# Patient Record
Sex: Female | Born: 1955
Health system: Southern US, Community
[De-identification: ages and names within clinical notes are randomized; demographics above are authoritative.]

## PROBLEM LIST (undated history)

## (undated) DIAGNOSIS — C50919 Malignant neoplasm of unspecified site of unspecified female breast: Secondary | ICD-10-CM

## (undated) DIAGNOSIS — R112 Nausea with vomiting, unspecified: Secondary | ICD-10-CM

## (undated) DIAGNOSIS — I10 Essential (primary) hypertension: Secondary | ICD-10-CM

## (undated) DIAGNOSIS — M1990 Unspecified osteoarthritis, unspecified site: Secondary | ICD-10-CM

## (undated) DIAGNOSIS — R7303 Prediabetes: Secondary | ICD-10-CM

## (undated) DIAGNOSIS — K219 Gastro-esophageal reflux disease without esophagitis: Secondary | ICD-10-CM

## (undated) DIAGNOSIS — Z9889 Other specified postprocedural states: Secondary | ICD-10-CM

## (undated) DIAGNOSIS — T7840XA Allergy, unspecified, initial encounter: Secondary | ICD-10-CM

## (undated) HISTORY — DX: Allergy, unspecified, initial encounter: T78.40XA

## (undated) HISTORY — DX: Essential (primary) hypertension: I10

## (undated) HISTORY — PX: COLONOSCOPY: SHX174

## (undated) HISTORY — DX: Malignant neoplasm of unspecified site of unspecified female breast: C50.919

---

## 1978-11-23 HISTORY — PX: OVARIAN CYST REMOVAL: SHX89

## 1998-06-13 ENCOUNTER — Other Ambulatory Visit: Admission: RE | Admit: 1998-06-13 | Discharge: 1998-06-13 | Payer: Self-pay | Admitting: Gynecology

## 1998-06-19 ENCOUNTER — Ambulatory Visit (HOSPITAL_COMMUNITY): Admission: RE | Admit: 1998-06-19 | Discharge: 1998-06-19 | Payer: Self-pay | Admitting: Internal Medicine

## 1998-06-26 ENCOUNTER — Ambulatory Visit (HOSPITAL_COMMUNITY): Admission: RE | Admit: 1998-06-26 | Discharge: 1998-06-26 | Payer: Self-pay | Admitting: Internal Medicine

## 1998-09-11 ENCOUNTER — Ambulatory Visit (HOSPITAL_COMMUNITY): Admission: RE | Admit: 1998-09-11 | Discharge: 1998-09-11 | Payer: Self-pay | Admitting: Gastroenterology

## 1999-06-09 ENCOUNTER — Other Ambulatory Visit: Admission: RE | Admit: 1999-06-09 | Discharge: 1999-06-09 | Payer: Self-pay | Admitting: Gynecology

## 1999-07-29 ENCOUNTER — Ambulatory Visit (HOSPITAL_COMMUNITY): Admission: RE | Admit: 1999-07-29 | Discharge: 1999-07-29 | Payer: Self-pay | Admitting: Urology

## 1999-07-29 ENCOUNTER — Encounter: Payer: Self-pay | Admitting: Urology

## 2000-12-01 ENCOUNTER — Other Ambulatory Visit: Admission: RE | Admit: 2000-12-01 | Discharge: 2000-12-01 | Payer: Self-pay | Admitting: Gynecology

## 2001-11-01 ENCOUNTER — Other Ambulatory Visit: Admission: RE | Admit: 2001-11-01 | Discharge: 2001-11-01 | Payer: Self-pay | Admitting: Obstetrics and Gynecology

## 2002-11-23 HISTORY — PX: JOINT REPLACEMENT: SHX530

## 2002-11-23 HISTORY — PX: TOTAL HIP ARTHROPLASTY: SHX124

## 2002-12-12 ENCOUNTER — Other Ambulatory Visit: Admission: RE | Admit: 2002-12-12 | Discharge: 2002-12-12 | Payer: Self-pay | Admitting: Obstetrics and Gynecology

## 2003-03-28 ENCOUNTER — Encounter: Payer: Self-pay | Admitting: Orthopedic Surgery

## 2003-04-04 ENCOUNTER — Encounter: Payer: Self-pay | Admitting: Orthopedic Surgery

## 2003-04-04 ENCOUNTER — Inpatient Hospital Stay (HOSPITAL_COMMUNITY): Admission: RE | Admit: 2003-04-04 | Discharge: 2003-04-08 | Payer: Self-pay | Admitting: Orthopedic Surgery

## 2003-04-07 ENCOUNTER — Encounter: Payer: Self-pay | Admitting: Orthopedic Surgery

## 2006-02-24 ENCOUNTER — Encounter: Admission: RE | Admit: 2006-02-24 | Discharge: 2006-05-25 | Payer: Self-pay | Admitting: Orthopedic Surgery

## 2013-10-31 ENCOUNTER — Encounter (INDEPENDENT_AMBULATORY_CARE_PROVIDER_SITE_OTHER): Payer: Self-pay | Admitting: General Surgery

## 2013-10-31 ENCOUNTER — Ambulatory Visit (INDEPENDENT_AMBULATORY_CARE_PROVIDER_SITE_OTHER): Payer: BC Managed Care – PPO | Admitting: General Surgery

## 2013-10-31 ENCOUNTER — Encounter (INDEPENDENT_AMBULATORY_CARE_PROVIDER_SITE_OTHER): Payer: Self-pay

## 2013-10-31 VITALS — BP 120/62 | HR 60 | Temp 98.0°F | Resp 18 | Ht 66.0 in | Wt 211.0 lb

## 2013-10-31 DIAGNOSIS — N6099 Unspecified benign mammary dysplasia of unspecified breast: Secondary | ICD-10-CM

## 2013-10-31 DIAGNOSIS — N6089 Other benign mammary dysplasias of unspecified breast: Secondary | ICD-10-CM

## 2013-10-31 NOTE — Progress Notes (Signed)
Patient ID: Candice Ayala, female   DOB: 08/20/1956, 57 y.o.   MRN: [id number]  Chief Complaint  Patient presents with  . New Evaluation    rt breast    HPI Candice Ayala is a 57 y.o. female.   HPI  She is referred by Dr. Cornella because of a recent diagnosis of right breast sclerosing papillary lesion with atypical ductal hyperplasia.  She was undergoing her annual mammogram when this lesion was noted at the 12:00 position anteriorly in the right breast. It measured 7 mm and was lobular an ultrasound. Image guided biopsy was performed and demonstrates the mentioned pathology. She has no breast complaints. She denies any nipple discharge or palpable nodules.  Her grandmother had breast cancer. Age at menarche was 10 or 11. She has not had any children. She has been menopausal since her 40s. She has not taken hormone replacement treatment.  History reviewed. No pertinent past medical history.  Past Surgical History  Procedure Laterality Date  . Ovarian cyst removal  1980    Family History  Problem Relation Age of Onset  . Cancer Mother     colon    Social History History  Substance Use Topics  . Smoking status: Never Smoker   . Smokeless tobacco: Not on file  . Alcohol Use: Not on file    Allergies  Allergen Reactions  . Contrast Media [Iodinated Diagnostic Agents] Hives    Current Outpatient Prescriptions  Medication Sig Dispense Refill  . co-enzyme Q-10 30 MG capsule Take 30 mg by mouth 3 (three) times daily.      . Garlic 10 MG CAPS Take by mouth.      . Multiple Vitamins-Minerals (MULTIVITAMIN WITH MINERALS) tablet Take 1 tablet by mouth daily.      . omeprazole (PRILOSEC) 20 MG capsule Take 20 mg by mouth daily.       No current facility-administered medications for this visit.    Review of Systems Review of Systems  Constitutional: Negative.   HENT: Negative.   Respiratory: Negative.   Cardiovascular: Negative.   Gastrointestinal: Negative.    Genitourinary: Negative.   Hematological: Negative.     Blood pressure 120/62, pulse 60, temperature 98 F (36.7 C), resp. rate 18, height 5' 6" (1.676 m), weight 211 lb (95.709 kg).  Physical Exam Physical Exam  Constitutional: No distress.  Overweight female.  HENT:  Head: Normocephalic and atraumatic.  Eyes: No scleral icterus.  Neck: Neck supple.  Cardiovascular: Normal rate and regular rhythm.   Pulmonary/Chest: Effort normal and breath sounds normal.  Ecchymosis and a firm area 12:00 position right breast. Small puncture wound lateral to this. No other palpable abnormalities. Left breast is without any palpable abnormalities. No nipple discharge present in either breast.  Abdominal: Soft. She exhibits no mass.  Lower midline scar.  Musculoskeletal:  No supraclavicular or axillary adenopathy.  Lymphadenopathy:    She has no cervical adenopathy.  Neurological: She is alert.  Skin: Skin is warm and dry.  Psychiatric: She has a normal mood and affect. Her behavior is normal.    Data Reviewed Mammograms. Mammogram and ultrasound report. Pathology report.  Assessment    Right breast sclerosing papillary lesion and atypical ductal hyperplasia. This is a potentially premalignant lesion and I explained this to her. I recommended wider excision. She understands and agrees.     Plan    Right breast biopsy/excision after wire localization.  I have explained the procedure, risks, and aftercare to her.    Risks include but are not limited to bleeding, infection, wound problems, seroma formation, anesthesia, need for another operation.  She seems to understand and agrees with the plan.       Kennet Mccort J 10/31/2013, 5:25 PM    

## 2013-10-31 NOTE — Patient Instructions (Signed)
Stop the garlic and CoQ10 5 days before your surgery.   BREAST BIOPSY/ PARTIAL MASTECTOMY: POST OP INSTRUCTIONS  Always review your discharge instruction sheet given to you by the facility where your surgery was performed.  IF YOU HAVE DISABILITY OR FAMILY LEAVE FORMS, YOU MUST BRING THEM TO THE OFFICE FOR PROCESSING.  DO NOT GIVE THEM TO YOUR DOCTOR.  1. A prescription for pain medication may be given to you upon discharge.  Take your pain medication as prescribed, if needed.  If narcotic pain medicine is not needed, then you may take acetaminophen (Tylenol) or ibuprofen (Advil) as needed. 2. Take your usually prescribed medications unless otherwise directed 3. If you need a refill on your pain medication, please contact your pharmacy.  They will contact our office to request authorization.  Prescriptions will not be filled after 5pm or on week-ends. 4. You should eat very light the first 24 hours after surgery, such as soup, crackers, pudding, etc.  Resume your normal diet the day after surgery. 5. Most patients will experience some swelling and bruising in the breast.  Ice packs and a good support bra will help.  Swelling and bruising can take several days to resolve.  6. It is common to experience some constipation if taking pain medication after surgery.  Increasing fluid intake and taking a stool softener will usually help or prevent this problem from occurring.  A mild laxative (Milk of Magnesia or Miralax) should be taken according to package directions if there are no bowel movements after 48 hours. 7. Unless discharge instructions indicate otherwise, you may remove your bandages 48 hours after surgery, and you may shower at that time.  You may have steri-strips (small skin tapes) in place directly over the incision.  These strips should be left on the skin for 14 days.  If your surgeon used skin glue on the incision, you may shower in 24 hours.  The glue will flake off over the next 2-3  weeks.  Any sutures or staples will be removed at the office during your follow-up visit. 8. ACTIVITIES:  You may resume regular daily activities (gradually increasing) beginning the next day.  Wearing a good support bra or sports bra minimizes pain and swelling.  You may have sexual intercourse when it is comfortable. a. You may drive when you no longer are taking prescription pain medication, you can comfortably wear a seatbelt, and you can safely maneuver your car and apply brakes. b. RETURN TO WORK:  _3-7 days when comfortable._____________________________________________________________________________________ 9. You should see your doctor in the office for a follow-up appointment approximately two weeks after your surgery.  Your doctor's nurse will typically make your follow-up appointment when she calls you with your pathology report.  Expect your pathology report 2-3 business days after your surgery.  You may call to check if you do not hear from [place] after three days. 10. OTHER INSTRUCTIONS: _______________________________________________________________________________________________ _____________________________________________________________________________________________________________________________________ _____________________________________________________________________________________________________________________________________ _____________________________________________________________________________________________________________________________________  WHEN TO CALL YOUR DOCTOR: 1. Fever over 101.0 2. Nausea and/or vomiting. 3. Extreme swelling or bruising. 4. Continued bleeding from incision. 5. Increased pain, redness, or drainage from the incision.  The clinic staff is available to answer your questions during regular business hours.  Please don't hesitate to call and ask to speak to one of the nurses for clinical concerns.  If you have a medical emergency, go to  the nearest emergency room or call 911.  A surgeon from [facility] is always on call at the hospital, [phone].  For further questions, please visit [email or web address]

## 2013-11-01 ENCOUNTER — Encounter (INDEPENDENT_AMBULATORY_CARE_PROVIDER_SITE_OTHER): Payer: Self-pay

## 2013-11-01 ENCOUNTER — Telehealth (INDEPENDENT_AMBULATORY_CARE_PROVIDER_SITE_OTHER): Payer: Self-pay | Admitting: General Surgery

## 2013-11-01 NOTE — Telephone Encounter (Signed)
Called patient about surgery, gave financial responsibilities, patient will call back

## 2013-11-01 NOTE — Telephone Encounter (Signed)
Noted  

## 2013-11-06 ENCOUNTER — Encounter (INDEPENDENT_AMBULATORY_CARE_PROVIDER_SITE_OTHER): Payer: Self-pay

## 2013-11-07 ENCOUNTER — Encounter (HOSPITAL_BASED_OUTPATIENT_CLINIC_OR_DEPARTMENT_OTHER): Payer: Self-pay | Admitting: *Deleted

## 2013-11-07 NOTE — Progress Notes (Signed)
No heart or resp problems-to come in for labs

## 2013-11-14 ENCOUNTER — Encounter (INDEPENDENT_AMBULATORY_CARE_PROVIDER_SITE_OTHER): Payer: Self-pay

## 2013-11-15 ENCOUNTER — Encounter (HOSPITAL_BASED_OUTPATIENT_CLINIC_OR_DEPARTMENT_OTHER): Payer: Self-pay | Admitting: *Deleted

## 2013-11-15 NOTE — Progress Notes (Signed)
To come in for [facility] labs- 

## 2013-11-24 ENCOUNTER — Encounter (HOSPITAL_BASED_OUTPATIENT_CLINIC_OR_DEPARTMENT_OTHER)
Admission: RE | Admit: 2013-11-24 | Discharge: 2013-11-24 | Disposition: A | Discharge status: Home / Self Care | Payer: BC Managed Care – PPO | Source: Ambulatory Visit | Attending: General Surgery | Admitting: General Surgery

## 2013-11-24 LAB — CBC WITH DIFFERENTIAL/PLATELET
BASOS ABS: 0 10*3/uL (ref 0.0–0.1)
BASOS PCT: 1 % (ref 0–1)
Eosinophils Absolute: 0.1 10*3/uL (ref 0.0–0.7)
Eosinophils Relative: 1 % (ref 0–5)
HEMATOCRIT: 37.3 % (ref 36.0–46.0)
HEMOGLOBIN: 12.4 g/dL (ref 12.0–15.0)
Lymphocytes Relative: 48 % — ABNORMAL HIGH (ref 12–46)
Lymphs Abs: 2.9 10*3/uL (ref 0.7–4.0)
MCH: 29.2 pg (ref 26.0–34.0)
MCHC: 33.2 g/dL (ref 30.0–36.0)
MCV: 88 fL (ref 78.0–100.0)
MONO ABS: 0.4 10*3/uL (ref 0.1–1.0)
MONOS PCT: 7 % (ref 3–12)
NEUTROS ABS: 2.6 10*3/uL (ref 1.7–7.7)
NEUTROS PCT: 43 % (ref 43–77)
Platelets: 330 10*3/uL (ref 150–400)
RBC: 4.24 MIL/uL (ref 3.87–5.11)
RDW: 13.8 % (ref 11.5–15.5)
WBC: 6.1 10*3/uL (ref 4.0–10.5)

## 2013-11-24 LAB — COMPREHENSIVE METABOLIC PANEL
ALBUMIN: 3.6 g/dL (ref 3.5–5.2)
ALK PHOS: 94 U/L (ref 39–117)
ALT: 12 U/L (ref 0–35)
AST: 18 U/L (ref 0–37)
BUN: 13 mg/dL (ref 6–23)
CO2: 26 meq/L (ref 19–32)
Calcium: 8.9 mg/dL (ref 8.4–10.5)
Chloride: 103 mEq/L (ref 96–112)
Creatinine, Ser: 0.66 mg/dL (ref 0.50–1.10)
GFR calc Af Amer: >90 mL/min (ref >90)
Glucose, Bld: 96 mg/dL (ref 70–99)
POTASSIUM: 4.7 meq/L (ref 3.7–5.3)
Sodium: 142 mEq/L (ref 137–147)
Total Bilirubin: 0.4 mg/dL (ref 0.3–1.2)
Total Protein: 7.4 g/dL (ref 6.0–8.3)

## 2013-11-24 LAB — PROTIME-INR
INR: 0.96 (ref 0.00–1.49)
Prothrombin Time: 12.6 seconds (ref 11.6–15.2)

## 2013-11-27 ENCOUNTER — Encounter (HOSPITAL_BASED_OUTPATIENT_CLINIC_OR_DEPARTMENT_OTHER): Payer: BC Managed Care – PPO | Admitting: Anesthesiology

## 2013-11-27 ENCOUNTER — Encounter (HOSPITAL_BASED_OUTPATIENT_CLINIC_OR_DEPARTMENT_OTHER)
Admission: RE | Disposition: A | Discharge status: Home / Self Care | Payer: Self-pay | Source: Ambulatory Visit | Attending: General Surgery

## 2013-11-27 ENCOUNTER — Ambulatory Visit (HOSPITAL_BASED_OUTPATIENT_CLINIC_OR_DEPARTMENT_OTHER)
Admission: RE | Admit: 2013-11-27 | Discharge: 2013-11-27 | Disposition: A | Discharge status: Home / Self Care | Payer: BC Managed Care – PPO | Source: Ambulatory Visit | Attending: General Surgery | Admitting: General Surgery

## 2013-11-27 ENCOUNTER — Encounter (HOSPITAL_BASED_OUTPATIENT_CLINIC_OR_DEPARTMENT_OTHER): Payer: Self-pay | Admitting: *Deleted

## 2013-11-27 ENCOUNTER — Ambulatory Visit (HOSPITAL_BASED_OUTPATIENT_CLINIC_OR_DEPARTMENT_OTHER): Payer: BC Managed Care – PPO | Admitting: Anesthesiology

## 2013-11-27 DIAGNOSIS — D249 Benign neoplasm of unspecified breast: Secondary | ICD-10-CM

## 2013-11-27 DIAGNOSIS — K219 Gastro-esophageal reflux disease without esophagitis: Secondary | ICD-10-CM | POA: Insufficient documentation

## 2013-11-27 DIAGNOSIS — N6089 Other benign mammary dysplasias of unspecified breast: Secondary | ICD-10-CM | POA: Insufficient documentation

## 2013-11-27 DIAGNOSIS — Z91041 Radiographic dye allergy status: Secondary | ICD-10-CM | POA: Insufficient documentation

## 2013-11-27 HISTORY — DX: Unspecified osteoarthritis, unspecified site: M19.90

## 2013-11-27 HISTORY — PX: BREAST BIOPSY: SHX20

## 2013-11-27 HISTORY — DX: Other specified postprocedural states: R11.2

## 2013-11-27 HISTORY — DX: Nausea with vomiting, unspecified: R11.2

## 2013-11-27 HISTORY — DX: Other specified postprocedural states: Z98.890

## 2013-11-27 HISTORY — DX: Gastro-esophageal reflux disease without esophagitis: K21.9

## 2013-11-27 SURGERY — BREAST BIOPSY WITH NEEDLE LOCALIZATION
Anesthesia: General | Site: Breast | Laterality: Right

## 2013-11-27 MED ORDER — LIDOCAINE HCL (CARDIAC) 20 MG/ML IV SOLN
INTRAVENOUS | Status: DC | PRN
  Administered 2013-11-27: 100 mg via INTRAVENOUS

## 2013-11-27 MED ORDER — ACETAMINOPHEN 325 MG PO TABS: 650.0000 mg | ORAL_TABLET | ORAL | Status: DC | PRN

## 2013-11-27 MED ORDER — PROPOFOL 10 MG/ML IV BOLUS
INTRAVENOUS | Status: DC | PRN
  Administered 2013-11-27: 140 mg via INTRAVENOUS

## 2013-11-27 MED ORDER — FENTANYL CITRATE 0.05 MG/ML IJ SOLN
INTRAMUSCULAR | Status: DC | PRN
  Administered 2013-11-27: 50 ug via INTRAVENOUS
  Administered 2013-11-27: 100 ug via INTRAVENOUS

## 2013-11-27 MED ORDER — LACTATED RINGERS IV SOLN
INTRAVENOUS | Status: DC
  Administered 2013-11-27 (×2): via INTRAVENOUS

## 2013-11-27 MED ORDER — SODIUM CHLORIDE 0.9 % IJ SOLN: 3.0000 mL | INTRAMUSCULAR | Status: DC | PRN

## 2013-11-27 MED ORDER — OXYCODONE HCL 5 MG/5ML PO SOLN: 5.0000 mg | Freq: Once | ORAL | Status: DC | PRN

## 2013-11-27 MED ORDER — OXYCODONE HCL 5 MG PO TABS: 5.0000 mg | ORAL_TABLET | Freq: Once | ORAL | Status: DC | PRN

## 2013-11-27 MED ORDER — HYDROMORPHONE HCL PF 1 MG/ML IJ SOLN
0.2500 mg | INTRAMUSCULAR | Status: DC | PRN
  Administered 2013-11-27: 0.5 mg via INTRAVENOUS

## 2013-11-27 MED ORDER — HYDROCODONE-ACETAMINOPHEN 5-325 MG PO TABS: 1.0000 | ORAL_TABLET | ORAL | Status: DC | PRN

## 2013-11-27 MED ORDER — ONDANSETRON HCL 4 MG/2ML IJ SOLN
4.0000 mg | Freq: Four times a day (QID) | INTRAMUSCULAR | Status: DC | PRN
Start: 2013-11-27 — End: 2013-11-27

## 2013-11-27 MED ORDER — MIDAZOLAM HCL 5 MG/5ML IJ SOLN
INTRAMUSCULAR | Status: DC | PRN
  Administered 2013-11-27: 2 mg via INTRAVENOUS

## 2013-11-27 MED ORDER — MORPHINE SULFATE 2 MG/ML IJ SOLN: 2.0000 mg | INTRAMUSCULAR | Status: DC | PRN

## 2013-11-27 MED ORDER — BUPIVACAINE HCL (PF) 0.5 % IJ SOLN
INTRAMUSCULAR | Status: DC | PRN
  Administered 2013-11-27: 9 mL

## 2013-11-27 MED ORDER — SODIUM BICARBONATE 4 % IV SOLN
INTRAVENOUS | Status: AC
  Filled 2013-11-27: qty 5

## 2013-11-27 MED ORDER — OXYCODONE HCL 5 MG PO TABS: 5.0000 mg | ORAL_TABLET | ORAL | Status: DC | PRN

## 2013-11-27 MED ORDER — CEFAZOLIN SODIUM-DEXTROSE 2-3 GM-% IV SOLR
INTRAVENOUS | Status: DC | PRN
Start: 2013-11-27 — End: 2013-11-27
  Administered 2013-11-27: 2 g via INTRAVENOUS

## 2013-11-27 MED ORDER — HYDROMORPHONE HCL PF 1 MG/ML IJ SOLN
INTRAMUSCULAR | Status: AC
  Filled 2013-11-27: qty 1

## 2013-11-27 MED ORDER — ACETAMINOPHEN 650 MG RE SUPP: 650.0000 mg | RECTAL | Status: DC | PRN

## 2013-11-27 MED ORDER — ONDANSETRON HCL 4 MG/2ML IJ SOLN
INTRAMUSCULAR | Status: DC | PRN
  Administered 2013-11-27: 4 mg via INTRAVENOUS

## 2013-11-27 MED ORDER — MIDAZOLAM HCL 2 MG/2ML IJ SOLN
INTRAMUSCULAR | Status: AC
  Filled 2013-11-27: qty 2

## 2013-11-27 MED ORDER — DEXAMETHASONE SODIUM PHOSPHATE 4 MG/ML IJ SOLN
INTRAMUSCULAR | Status: DC | PRN
  Administered 2013-11-27: 10 mg via INTRAVENOUS

## 2013-11-27 MED ORDER — BUPIVACAINE HCL (PF) 0.5 % IJ SOLN
INTRAMUSCULAR | Status: AC
Start: 2013-11-27 — End: 2013-11-27
  Filled 2013-11-27: qty 30

## 2013-11-27 MED ORDER — MIDAZOLAM HCL 2 MG/2ML IJ SOLN
1.0000 mg | INTRAMUSCULAR | Status: DC | PRN
Start: 2013-11-27 — End: 2013-11-27

## 2013-11-27 MED ORDER — FENTANYL CITRATE 0.05 MG/ML IJ SOLN: 50.0000 ug | INTRAMUSCULAR | Status: DC | PRN

## 2013-11-27 MED ORDER — LIDOCAINE HCL (PF) 1 % IJ SOLN
INTRAMUSCULAR | Status: AC
  Filled 2013-11-27: qty 30

## 2013-11-27 MED ORDER — FENTANYL CITRATE 0.05 MG/ML IJ SOLN
INTRAMUSCULAR | Status: AC
  Filled 2013-11-27: qty 4

## 2013-11-27 SURGICAL SUPPLY — 44 items
APL SKNCLS STERI-STRIP NONHPOA (GAUZE/BANDAGES/DRESSINGS) ×1
BENZOIN TINCTURE PRP APPL 2/3 (GAUZE/BANDAGES/DRESSINGS) ×2 IMPLANT
BINDER BREAST LRG (GAUZE/BANDAGES/DRESSINGS) IMPLANT
BINDER BREAST MEDIUM (GAUZE/BANDAGES/DRESSINGS) IMPLANT
BINDER BREAST XLRG (GAUZE/BANDAGES/DRESSINGS) IMPLANT
BINDER BREAST XXLRG (GAUZE/BANDAGES/DRESSINGS) IMPLANT
BLADE SURG 10 STRL SS (BLADE) ×2 IMPLANT
CANISTER SUCT 1200ML W/VALVE (MISCELLANEOUS) IMPLANT
CHLORAPREP W/TINT 26ML (MISCELLANEOUS) ×2 IMPLANT
COVER MAYO STAND STRL (DRAPES) ×2 IMPLANT
COVER TABLE BACK 60X90 (DRAPES) ×2 IMPLANT
DECANTER SPIKE VIAL GLASS SM (MISCELLANEOUS) ×2 IMPLANT
DEVICE DUBIN W/COMP PLATE 8390 (MISCELLANEOUS) IMPLANT
DRAPE PED LAPAROTOMY (DRAPES) ×2 IMPLANT
DRAPE UTILITY XL STRL (DRAPES) ×2 IMPLANT
ELECT COATED BLADE 2.86 ST (ELECTRODE) ×2 IMPLANT
ELECT REM PT RETURN 9FT ADLT (ELECTROSURGICAL) ×2
ELECTRODE REM PT RTRN 9FT ADLT (ELECTROSURGICAL) ×1 IMPLANT
GLOVE BIOGEL M 7.0 STRL (GLOVE) ×1 IMPLANT
GLOVE BIOGEL PI IND STRL 7.5 (GLOVE) IMPLANT
GLOVE BIOGEL PI IND STRL 8.5 (GLOVE) ×1 IMPLANT
GLOVE BIOGEL PI INDICATOR 7.5 (GLOVE) ×1
GLOVE BIOGEL PI INDICATOR 8.5 (GLOVE) ×1
GLOVE ECLIPSE 8.0 STRL XLNG CF (GLOVE) ×2 IMPLANT
GOWN PREVENTION PLUS XLARGE (GOWN DISPOSABLE) ×2 IMPLANT
GOWN STRL REUS W/ TWL LRG LVL3 (GOWN DISPOSABLE) ×1 IMPLANT
GOWN STRL REUS W/TWL LRG LVL3 (GOWN DISPOSABLE) ×2
NDL HYPO 25X1 1.5 SAFETY (NEEDLE) ×1 IMPLANT
NEEDLE HYPO 25X1 1.5 SAFETY (NEEDLE) ×2 IMPLANT
NS IRRIG 1000ML POUR BTL (IV SOLUTION) ×2 IMPLANT
PACK BASIN DAY SURGERY FS (CUSTOM PROCEDURE TRAY) ×2 IMPLANT
PENCIL BUTTON HOLSTER BLD 10FT (ELECTRODE) ×2 IMPLANT
SLEEVE SCD COMPRESS KNEE MED (MISCELLANEOUS) IMPLANT
SPONGE GAUZE 4X4 12PLY (GAUZE/BANDAGES/DRESSINGS) ×2 IMPLANT
SPONGE GAUZE 4X4 12PLY STER LF (GAUZE/BANDAGES/DRESSINGS) ×2 IMPLANT
STRIP CLOSURE SKIN 1/2X4 (GAUZE/BANDAGES/DRESSINGS) ×2 IMPLANT
SUT MON AB 4-0 PC3 18 (SUTURE) ×2 IMPLANT
SUT SILK 2 0 FS (SUTURE) ×2 IMPLANT
SUT VICRYL 3-0 CR8 SH (SUTURE) ×2 IMPLANT
SYR CONTROL 10ML LL (SYRINGE) ×2 IMPLANT
TOWEL OR 17X24 6PK STRL BLUE (TOWEL DISPOSABLE) ×3 IMPLANT
TOWEL OR NON WOVEN STRL DISP B (DISPOSABLE) ×2 IMPLANT
TUBE CONNECTING 20X1/4 (TUBING) IMPLANT
YANKAUER SUCT BULB TIP NO VENT (SUCTIONS) IMPLANT

## 2013-11-27 NOTE — Anesthesia Procedure Notes (Signed)
Procedure Name: LMA Insertion Date/Time: 11/27/2013 2:42 PM Performed by: Lieutenant Diego Pre-anesthesia Checklist: Patient identified, Emergency Drugs available, Suction available and Patient being monitored Patient Re-evaluated:Patient Re-evaluated prior to inductionOxygen Delivery Method: Circle System Utilized Preoxygenation: Pre-oxygenation with 100% oxygen Intubation Type: IV induction Ventilation: Mask ventilation without difficulty LMA: LMA inserted LMA Size: 4.0 Number of attempts: 1 Airway Equipment and Method: bite block Placement Confirmation: positive ETCO2 and breath sounds checked- equal and bilateral Tube secured with: Tape Dental Injury: Teeth and Oropharynx as per pre-operative assessment

## 2013-11-27 NOTE — H&P (View-Only) (Signed)
Patient ID: Candice Ayala, female   DOB: 10-27-1956, 58 y.o.   MRN: [id number]  Chief Complaint  Patient presents with  . New Evaluation    rt breast    HPI Candice Ayala is a 58 y.o. female.   HPI  She is referred by Dr. Marcelo Baldy because of a recent diagnosis of right breast sclerosing papillary lesion with atypical ductal hyperplasia.  She was undergoing her annual mammogram when this lesion was noted at the 12:00 position anteriorly in the right breast. It measured 7 mm and was lobular an ultrasound. Image guided biopsy was performed and demonstrates the mentioned pathology. She has no breast complaints. She denies any nipple discharge or palpable nodules.  Her grandmother had breast cancer. Age at menarche was 53 or 21. She has not had any children. She has been menopausal since her 48s. She has not taken hormone replacement treatment.  History reviewed. No pertinent past medical history.  Past Surgical History  Procedure Laterality Date  . Ovarian cyst removal  1980    Family History  Problem Relation Age of Onset  . Cancer Mother     colon    Social History History  Substance Use Topics  . Smoking status: Never Smoker   . Smokeless tobacco: Not on file  . Alcohol Use: Not on file    Allergies  Allergen Reactions  . Contrast Media [Iodinated Diagnostic Agents] Hives    Current Outpatient Prescriptions  Medication Sig Dispense Refill  . co-enzyme Q-10 30 MG capsule Take 30 mg by mouth 3 (three) times daily.      . Garlic 10 MG CAPS Take by mouth.      . Multiple Vitamins-Minerals (MULTIVITAMIN WITH MINERALS) tablet Take 1 tablet by mouth daily.      Marland Kitchen omeprazole (PRILOSEC) 20 MG capsule Take 20 mg by mouth daily.       No current facility-administered medications for this visit.    Review of Systems Review of Systems  Constitutional: Negative.   HENT: Negative.   Respiratory: Negative.   Cardiovascular: Negative.   Gastrointestinal: Negative.    Genitourinary: Negative.   Hematological: Negative.     Blood pressure 120/62, pulse 60, temperature 98 F (36.7 C), resp. rate 18, height 5\' 6"  (1.676 m), weight 211 lb (95.709 kg).  Physical Exam Physical Exam  Constitutional: No distress.  Overweight female.  HENT:  Head: Normocephalic and atraumatic.  Eyes: No scleral icterus.  Neck: Neck supple.  Cardiovascular: Normal rate and regular rhythm.   Pulmonary/Chest: Effort normal and breath sounds normal.  Ecchymosis and a firm area 12:00 position right breast. Small puncture wound lateral to this. No other palpable abnormalities. Left breast is without any palpable abnormalities. No nipple discharge present in either breast.  Abdominal: Soft. She exhibits no mass.  Lower midline scar.  Musculoskeletal:  No supraclavicular or axillary adenopathy.  Lymphadenopathy:    She has no cervical adenopathy.  Neurological: She is alert.  Skin: Skin is warm and dry.  Psychiatric: She has a normal mood and affect. Her behavior is normal.    Data Reviewed Mammograms. Mammogram and ultrasound report. Pathology report.  Assessment    Right breast sclerosing papillary lesion and atypical ductal hyperplasia. This is a potentially premalignant lesion and I explained this to her. I recommended wider excision. She understands and agrees.     Plan    Right breast biopsy/excision after wire localization.  I have explained the procedure, risks, and aftercare to her.  Risks include but are not limited to bleeding, infection, wound problems, seroma formation, anesthesia, need for another operation.  She seems to understand and agrees with the plan.       Verner Mccrone J 10/31/2013, 5:25 PM

## 2013-11-27 NOTE — Op Note (Signed)
Operative Note  Candice Ayala female 58 y.o. 11/27/2013  PREOPERATIVE DX:  Right breast sclerosing papillary lesion with atypical ductal hyperplasia  POSTOPERATIVE DX:  Same  PROCEDURE:  Right breast biopsy after wire localization         Surgeon: Odis Hollingshead   Assistants: None  Anesthesia: General LMA anesthesia  Indications: This is a 58 year old female who had an abnormal lesion on mammogram in the right breast. Biopsy demonstrated the pathology above. Wider excision is recommended and she presents for that.    Procedure Detail:  She underwent successful wire localization at the breast imaging Center. She was seen in the holding area in the right breast was marked with my initials. She was brought to the operating room placed supine on the operating table and a general anesthetic was administered. The bandage on the right breast was removed and the wire was cut closer to the skin. The right breast and wire were then sterilely prepped and draped.  In the superior aspect of the right breast local anesthetic (1/2% Marcaine) was injected and a curvilinear incision was made through the skin and subcutaneous tissue. Subcutaneous flaps were raised in all directions. The wire was cut a little shorter and brought into the wound. Using electrocautery, I then performed a lumpectomy of the tissue around the wire trying to get adequate gross margins. Once the specimen was removed, it was oriented with silk suture and then a specimen mammogram was performed. The area of concern was contained in the specimen and this was confirmed by the radiologist. It appeared to me that it may be close to the inferior margin so using electrocautery I excised more inferior tissue and sent this separately.  I then injected local anesthetic into the wound and controlled bleeding with electrocautery. Once hemostasis was adequate, the subcutaneous tissue was approximated with interrupted 3-0 Vicryl sutures. The  skin was closed with a running 4-0 Monocryl subcuticular stitch. Steri-Strips and a sterile dressing were applied.  She tolerated the procedure well without any apparent complications and was taken to the recovery in satisfactory condition.   Estimated Blood Loss:  less than 100 mL         Drains: none  Blood Given: none          Specimens: Right breast tissue        Complications:  * No complications entered in OR log *         Disposition: PACU - hemodynamically stable.         Condition: stable

## 2013-11-27 NOTE — Transfer of Care (Signed)
Immediate Anesthesia Transfer of Care Note  Patient: Candice Ayala  Procedure(s) Performed: Procedure(s): RIGHT BREAST BIOPSY WITH NEEDLE LOCALIZATION (Right)  Patient Location: PACU  Anesthesia Type:General  Level of Consciousness: awake and alert   Airway & Oxygen Therapy: Patient Spontanous Breathing and Patient connected to face mask oxygen  Post-op Assessment: Report given to PACU RN and Post -op Vital signs reviewed and stable  Post vital signs: Reviewed and stable  Complications: No apparent anesthesia complications

## 2013-11-27 NOTE — Discharge Instructions (Addendum)
[place] Office Phone Number [phone]  BREAST BIOPSY/ PARTIAL MASTECTOMY: POST OP INSTRUCTIONS  Always review your discharge instruction sheet given to you by the facility where your surgery was performed.  IF YOU HAVE DISABILITY OR FAMILY LEAVE FORMS, YOU MUST BRING THEM TO THE OFFICE FOR PROCESSING.  DO NOT GIVE THEM TO YOUR DOCTOR.  1. A prescription for pain medication may be given to you upon discharge.  Take your pain medication as prescribed, if needed.  If narcotic pain medicine is not needed, then you may take acetaminophen (Tylenol) or ibuprofen (Advil) as needed. 2. Take your usually prescribed medications unless otherwise directed 3. If you need a refill on your pain medication, please contact your pharmacy.  They will contact our office to request authorization.  Prescriptions will not be filled after 5pm or on week-ends. 4. You should eat very light the first 24 hours after surgery, such as soup, crackers, pudding, etc.  Resume your normal diet the day after surgery. 5. Most patients will experience some swelling and bruising in the breast.  Ice packs and a good support bra will help.  Swelling and bruising can take several days to resolve.  6. It is common to experience some constipation if taking pain medication after surgery.  Increasing fluid intake and taking a stool softener will usually help or prevent this problem from occurring.  A mild laxative (Milk of Magnesia or Miralax) should be taken according to package directions if there are no bowel movements after 48 hours. 7. Unless discharge instructions indicate otherwise, you may remove your bandages 48 hours after surgery, and you may shower at that time.  You may have steri-strips (small skin tapes) in place directly over the incision.  These strips should be left on the skin for 14 days.  If your surgeon used skin glue on the incision, you may shower in 24 hours.  The glue will flake off over the next  2-3 weeks.  Any sutures or staples will be removed at the office during your follow-up visit. 8. ACTIVITIES:  You may resume regular daily activities (gradually increasing) beginning the next day.  Wearing a good support bra or sports bra minimizes pain and swelling.  You may have sexual intercourse when it is comfortable. a. You may drive when you no longer are taking prescription pain medication, you can comfortably wear a seatbelt, and you can safely maneuver your car and apply brakes. b. RETURN TO WORK:  __3-5 days when comfortable.____________________________________________________________________________________ 9. You should see your doctor in the office for a follow-up appointment approximately two weeks after your surgery.  Your doctors nurse will typically make your follow-up appointment when she calls you with your pathology report.  Expect your pathology report 3 business days after your surgery.  You may call to check if you do not hear from [place] after three days. 10. OTHER INSTRUCTIONS: _______________________________________________________________________________________________ _____________________________________________________________________________________________________________________________________ _____________________________________________________________________________________________________________________________________ _____________________________________________________________________________________________________________________________________  WHEN TO CALL YOUR DOCTOR: 1. Fever over 101.0 2. Nausea and/or vomiting. 3. Extreme swelling or bruising. 4. Continued bleeding from incision. 5. Increased pain, redness, or drainage from the incision.  The clinic staff is available to answer your questions during regular business hours.  Please dont hesitate to call and ask to speak to one of the nurses for clinical concerns.  If you have a medical emergency, go  to the nearest emergency room or call 911.  A surgeon from [facility] is always on call at the hospital.  For further questions, please  visit [email or web address]    Post Anesthesia Home Care Instructions  Activity: Get plenty of rest for the remainder of the day. A responsible adult should stay with you for 24 hours following the procedure.  For the next 24 hours, DO NOT: -Drive a car -Paediatric nurse -Drink alcoholic beverages -Take any medication unless instructed by your physician -Make any legal decisions or sign important papers.  Meals: Start with liquid foods such as gelatin or soup. Progress to regular foods as tolerated. Avoid greasy, spicy, heavy foods. If nausea and/or vomiting occur, drink only clear liquids until the nausea and/or vomiting subsides. Call your physician if vomiting continues.  Special Instructions/Symptoms: Your throat may feel dry or sore from the anesthesia or the breathing tube placed in your throat during surgery. If this causes discomfort, gargle with warm salt water. The discomfort should disappear within 24 hours.

## 2013-11-27 NOTE — Interval H&P Note (Signed)
History and Physical Interval Note:  11/27/2013 2:30 PM  [place]  has presented today for surgery, with the diagnosis of atypical ductal hyperplasia  The various methods of treatment have been discussed with the patient and family. After consideration of risks, benefits and other options for treatment, the patient has consented to  Procedure(s): RIGHT BREAST BIOPSY WITH NEEDLE LOCALIZATION (Right) as a surgical intervention .  The patient's history has been reviewed, patient examined, no change in status, stable for surgery.  I have reviewed the patient's chart and labs.  Questions were answered to the patient's satisfaction.     Natonya Finstad Lenna Sciara

## 2013-11-27 NOTE — Anesthesia Postprocedure Evaluation (Signed)
  Anesthesia Post-op Note  Patient: Candice Ayala  Procedure(s) Performed: Procedure(s): RIGHT BREAST BIOPSY WITH NEEDLE LOCALIZATION (Right)  Patient Location: PACU  Anesthesia Type:General  Level of Consciousness: awake and alert   Airway and Oxygen Therapy: Patient Spontanous Breathing  Post-op Pain: mild  Post-op Assessment: Post-op Vital signs reviewed, Patient's Cardiovascular Status Stable and Respiratory Function Stable  Post-op Vital Signs: Reviewed  Filed Vitals:   11/27/13 1545  BP: 166/94  Pulse: 81  Temp:   Resp: 15    Complications: No apparent anesthesia complications

## 2013-11-27 NOTE — Anesthesia Preprocedure Evaluation (Signed)
Anesthesia Evaluation  Patient identified by MRN, date of birth, ID band Patient awake    Reviewed: Allergy & Precautions, H&P , NPO status , Patient's Chart, lab work & pertinent test results  History of Anesthesia Complications (+) PONV  Airway Mallampati: II TM Distance: >3 FB Neck ROM: Full    Dental no notable dental hx. (+) Teeth Intact and Dental Advisory Given   Pulmonary neg pulmonary ROS,  breath sounds clear to auscultation  Pulmonary exam normal       Cardiovascular negative cardio ROS  Rhythm:Regular Rate:Normal     Neuro/Psych negative neurological ROS  negative psych ROS   GI/Hepatic Neg liver ROS, GERD-  Medicated and Controlled,  Endo/Other  negative endocrine ROS  Renal/GU negative Renal ROS  negative genitourinary   Musculoskeletal   Abdominal   Peds  Hematology negative hematology ROS (+)   Anesthesia Other Findings   Reproductive/Obstetrics negative OB ROS                           Anesthesia Physical Anesthesia Plan  ASA: II  Anesthesia Plan: General   Post-op Pain Management:    Induction: Intravenous  Airway Management Planned: LMA  Additional Equipment:   Intra-op Plan:   Post-operative Plan: Extubation in OR  Informed Consent: I have reviewed the patients History and Physical, chart, labs and discussed the procedure including the risks, benefits and alternatives for the proposed anesthesia with the patient or authorized representative who has indicated his/her understanding and acceptance.   Dental advisory given  Plan Discussed with: CRNA  Anesthesia Plan Comments:         Anesthesia Quick Evaluation

## 2013-11-28 ENCOUNTER — Encounter (HOSPITAL_BASED_OUTPATIENT_CLINIC_OR_DEPARTMENT_OTHER): Payer: Self-pay | Admitting: General Surgery

## 2013-11-30 ENCOUNTER — Telehealth (INDEPENDENT_AMBULATORY_CARE_PROVIDER_SITE_OTHER): Payer: Self-pay | Admitting: General Surgery

## 2013-11-30 NOTE — Telephone Encounter (Signed)
Pt called for path results.  Please advise after Dr. Zella Richer has reviewed.  Call her:  [phone].

## 2013-11-30 NOTE — Telephone Encounter (Signed)
Pathology demonstrates no evidence of cancer.  Please inform her.

## 2013-11-30 NOTE — Telephone Encounter (Signed)
Called pt with path results, as requested.

## 2013-12-01 ENCOUNTER — Other Ambulatory Visit (INDEPENDENT_AMBULATORY_CARE_PROVIDER_SITE_OTHER): Payer: Self-pay | Admitting: General Surgery

## 2013-12-11 ENCOUNTER — Encounter (INDEPENDENT_AMBULATORY_CARE_PROVIDER_SITE_OTHER): Payer: Self-pay | Admitting: General Surgery

## 2013-12-11 ENCOUNTER — Ambulatory Visit (INDEPENDENT_AMBULATORY_CARE_PROVIDER_SITE_OTHER): Payer: BC Managed Care – PPO | Admitting: General Surgery

## 2013-12-11 VITALS — BP 160/80 | HR 64 | Temp 97.7°F | Resp 16 | Ht 66.0 in | Wt 212.4 lb

## 2013-12-11 DIAGNOSIS — Z4889 Encounter for other specified surgical aftercare: Secondary | ICD-10-CM

## 2013-12-11 NOTE — Progress Notes (Signed)
Procedure:  Right breast biopsy after needle localization  Date:  11/27/2013  Pathology:  Sclerosing intraductal papilloma with no  malignancy  History:  She is here for her first postoperative visit. She has some intermittent soreness around the incision. We discussed her pathology.  Exam: General- Is in NAD. Right breast-up wound is clean and intact; Steri-Strips are present.  Assessment:  Pathology is benign and wound is healing well.  Plan:  May start removing Steri-Strips. Followup as needed.

## 2013-12-11 NOTE — Patient Instructions (Signed)
Call if you have any wound problems. May use Mederma on the wound.

## 2016-03-11 DIAGNOSIS — I10 Essential (primary) hypertension: Secondary | ICD-10-CM | POA: Diagnosis not present

## 2016-03-11 DIAGNOSIS — Z79899 Other long term (current) drug therapy: Secondary | ICD-10-CM | POA: Diagnosis not present

## 2016-03-11 DIAGNOSIS — J320 Chronic maxillary sinusitis: Secondary | ICD-10-CM | POA: Diagnosis not present

## 2016-03-11 DIAGNOSIS — J322 Chronic ethmoidal sinusitis: Secondary | ICD-10-CM | POA: Diagnosis not present

## 2016-03-11 DIAGNOSIS — J410 Simple chronic bronchitis: Secondary | ICD-10-CM | POA: Diagnosis not present

## 2016-03-11 DIAGNOSIS — J040 Acute laryngitis: Secondary | ICD-10-CM | POA: Diagnosis not present

## 2016-05-07 DIAGNOSIS — H47012 Ischemic optic neuropathy, left eye: Secondary | ICD-10-CM | POA: Diagnosis not present

## 2016-05-07 DIAGNOSIS — H353121 Nonexudative age-related macular degeneration, left eye, early dry stage: Secondary | ICD-10-CM | POA: Diagnosis not present

## 2016-06-30 DIAGNOSIS — I10 Essential (primary) hypertension: Secondary | ICD-10-CM | POA: Diagnosis not present

## 2016-06-30 DIAGNOSIS — Z0001 Encounter for general adult medical examination with abnormal findings: Secondary | ICD-10-CM | POA: Diagnosis not present

## 2016-06-30 DIAGNOSIS — Z0000 Encounter for general adult medical examination without abnormal findings: Secondary | ICD-10-CM | POA: Diagnosis not present

## 2016-07-23 DIAGNOSIS — Z1211 Encounter for screening for malignant neoplasm of colon: Secondary | ICD-10-CM | POA: Diagnosis not present

## 2016-07-23 DIAGNOSIS — Z800 Family history of malignant neoplasm of digestive organs: Secondary | ICD-10-CM | POA: Diagnosis not present

## 2016-07-23 DIAGNOSIS — Z86010 Personal history of colonic polyps: Secondary | ICD-10-CM | POA: Diagnosis not present

## 2016-07-23 DIAGNOSIS — K625 Hemorrhage of anus and rectum: Secondary | ICD-10-CM | POA: Diagnosis not present

## 2016-09-04 DIAGNOSIS — D125 Benign neoplasm of sigmoid colon: Secondary | ICD-10-CM | POA: Diagnosis not present

## 2016-09-04 DIAGNOSIS — D120 Benign neoplasm of cecum: Secondary | ICD-10-CM | POA: Diagnosis not present

## 2016-09-04 DIAGNOSIS — K635 Polyp of colon: Secondary | ICD-10-CM | POA: Diagnosis not present

## 2016-09-04 DIAGNOSIS — Z800 Family history of malignant neoplasm of digestive organs: Secondary | ICD-10-CM | POA: Diagnosis not present

## 2016-09-04 DIAGNOSIS — D123 Benign neoplasm of transverse colon: Secondary | ICD-10-CM | POA: Diagnosis not present

## 2016-09-04 DIAGNOSIS — Z1211 Encounter for screening for malignant neoplasm of colon: Secondary | ICD-10-CM | POA: Diagnosis not present

## 2016-09-04 LAB — HM COLONOSCOPY

## 2016-09-16 DIAGNOSIS — J309 Allergic rhinitis, unspecified: Secondary | ICD-10-CM | POA: Diagnosis not present

## 2016-09-16 DIAGNOSIS — I10 Essential (primary) hypertension: Secondary | ICD-10-CM | POA: Diagnosis not present

## 2016-11-11 DIAGNOSIS — Z1231 Encounter for screening mammogram for malignant neoplasm of breast: Secondary | ICD-10-CM | POA: Diagnosis not present

## 2016-12-04 DIAGNOSIS — I10 Essential (primary) hypertension: Secondary | ICD-10-CM | POA: Diagnosis not present

## 2016-12-04 DIAGNOSIS — J029 Acute pharyngitis, unspecified: Secondary | ICD-10-CM | POA: Diagnosis not present

## 2016-12-11 DIAGNOSIS — Z6833 Body mass index (BMI) 33.0-33.9, adult: Secondary | ICD-10-CM | POA: Diagnosis not present

## 2016-12-11 DIAGNOSIS — Z01419 Encounter for gynecological examination (general) (routine) without abnormal findings: Secondary | ICD-10-CM | POA: Diagnosis not present

## 2017-01-14 DIAGNOSIS — H35033 Hypertensive retinopathy, bilateral: Secondary | ICD-10-CM | POA: Diagnosis not present

## 2017-01-14 DIAGNOSIS — H2513 Age-related nuclear cataract, bilateral: Secondary | ICD-10-CM | POA: Diagnosis not present

## 2017-01-14 DIAGNOSIS — H25013 Cortical age-related cataract, bilateral: Secondary | ICD-10-CM | POA: Diagnosis not present

## 2017-01-14 DIAGNOSIS — H25043 Posterior subcapsular polar age-related cataract, bilateral: Secondary | ICD-10-CM | POA: Diagnosis not present

## 2017-09-15 DIAGNOSIS — H3581 Retinal edema: Secondary | ICD-10-CM | POA: Diagnosis not present

## 2017-09-15 DIAGNOSIS — H35033 Hypertensive retinopathy, bilateral: Secondary | ICD-10-CM | POA: Diagnosis not present

## 2017-11-17 DIAGNOSIS — M810 Age-related osteoporosis without current pathological fracture: Secondary | ICD-10-CM | POA: Diagnosis not present

## 2017-11-17 DIAGNOSIS — M8588 Other specified disorders of bone density and structure, other site: Secondary | ICD-10-CM | POA: Diagnosis not present

## 2017-11-17 DIAGNOSIS — Z1231 Encounter for screening mammogram for malignant neoplasm of breast: Secondary | ICD-10-CM | POA: Diagnosis not present

## 2017-11-17 LAB — HM MAMMOGRAPHY: HM MAMMO: NORMAL (ref 0–4)

## 2017-12-15 DIAGNOSIS — M810 Age-related osteoporosis without current pathological fracture: Secondary | ICD-10-CM | POA: Diagnosis not present

## 2017-12-17 DIAGNOSIS — Z6834 Body mass index (BMI) 34.0-34.9, adult: Secondary | ICD-10-CM | POA: Diagnosis not present

## 2017-12-17 DIAGNOSIS — Z01411 Encounter for gynecological examination (general) (routine) with abnormal findings: Secondary | ICD-10-CM | POA: Diagnosis not present

## 2017-12-17 DIAGNOSIS — M810 Age-related osteoporosis without current pathological fracture: Secondary | ICD-10-CM | POA: Diagnosis not present

## 2017-12-17 DIAGNOSIS — Z780 Asymptomatic menopausal state: Secondary | ICD-10-CM | POA: Diagnosis not present

## 2018-02-17 DIAGNOSIS — H2513 Age-related nuclear cataract, bilateral: Secondary | ICD-10-CM | POA: Diagnosis not present

## 2018-02-17 DIAGNOSIS — H04123 Dry eye syndrome of bilateral lacrimal glands: Secondary | ICD-10-CM | POA: Diagnosis not present

## 2018-02-17 DIAGNOSIS — H25043 Posterior subcapsular polar age-related cataract, bilateral: Secondary | ICD-10-CM | POA: Diagnosis not present

## 2018-02-17 DIAGNOSIS — H25013 Cortical age-related cataract, bilateral: Secondary | ICD-10-CM | POA: Diagnosis not present

## 2018-04-08 DIAGNOSIS — J0101 Acute recurrent maxillary sinusitis: Secondary | ICD-10-CM | POA: Diagnosis not present

## 2018-10-03 ENCOUNTER — Ambulatory Visit (INDEPENDENT_AMBULATORY_CARE_PROVIDER_SITE_OTHER): Payer: BLUE CROSS/BLUE SHIELD | Admitting: Internal Medicine

## 2018-10-03 ENCOUNTER — Encounter: Payer: Self-pay | Admitting: Internal Medicine

## 2018-10-03 VITALS — BP 114/70 | HR 92 | Temp 98.5°F | Ht 65.0 in | Wt 213.0 lb

## 2018-10-03 DIAGNOSIS — R7309 Other abnormal glucose: Secondary | ICD-10-CM | POA: Diagnosis not present

## 2018-10-03 DIAGNOSIS — Z0000 Encounter for general adult medical examination without abnormal findings: Secondary | ICD-10-CM | POA: Diagnosis not present

## 2018-10-03 DIAGNOSIS — Z23 Encounter for immunization: Secondary | ICD-10-CM | POA: Diagnosis not present

## 2018-10-03 LAB — CBC
HEMOGLOBIN: 12.7 g/dL (ref 11.1–15.9)
Hematocrit: 39.3 % (ref 34.0–46.6)
MCH: 28.1 pg (ref 26.6–33.0)
MCHC: 32.3 g/dL (ref 31.5–35.7)
MCV: 87 fL (ref 79–97)
Platelets: 378 10*3/uL (ref 150–450)
RBC: 4.52 x10E6/uL (ref 3.77–5.28)
RDW: 13.3 % (ref 12.3–15.4)
WBC: 5.2 10*3/uL (ref 3.4–10.8)

## 2018-10-03 LAB — CMP14+EGFR
ALT: 9 IU/L (ref 0–32)
AST: 17 IU/L (ref 0–40)
Albumin/Globulin Ratio: 1.6 (ref 1.2–2.2)
Albumin: 4.6 g/dL (ref 3.6–4.8)
Alkaline Phosphatase: 74 IU/L (ref 39–117)
BUN/Creatinine Ratio: 13 (ref 12–28)
BUN: 10 mg/dL (ref 8–27)
Bilirubin Total: 0.7 mg/dL (ref 0.0–1.2)
CO2: 25 mmol/L (ref 20–29)
Calcium: 9.8 mg/dL (ref 8.7–10.3)
Chloride: 103 mmol/L (ref 96–106)
Creatinine, Ser: 0.77 mg/dL (ref 0.57–1.00)
GFR calc Af Amer: 94 mL/min/{1.73_m2} (ref >59)
GFR calc non Af Amer: 82 mL/min/{1.73_m2} (ref >59)
GLUCOSE: 98 mg/dL (ref 65–99)
Globulin, Total: 2.8 g/dL (ref 1.5–4.5)
POTASSIUM: 4.5 mmol/L (ref 3.5–5.2)
Sodium: 145 mmol/L — ABNORMAL HIGH (ref 134–144)
Total Protein: 7.4 g/dL (ref 6.0–8.5)

## 2018-10-03 LAB — POCT URINALYSIS DIPSTICK
Bilirubin, UA: NEGATIVE
Glucose, UA: NEGATIVE
Ketones, UA: NEGATIVE
Leukocytes, UA: NEGATIVE
Nitrite, UA: NEGATIVE
Protein, UA: NEGATIVE
Spec Grav, UA: 1.015 (ref 1.010–1.025)
Urobilinogen, UA: 0.2 E.U./dL
pH, UA: 7.5 (ref 5.0–8.0)

## 2018-10-03 LAB — LIPID PANEL
CHOLESTEROL TOTAL: 272 mg/dL — AB (ref 100–199)
Chol/HDL Ratio: 3.6 ratio (ref 0.0–4.4)
HDL: 75 mg/dL (ref >39)
LDL Calculated: 180 mg/dL — ABNORMAL HIGH (ref 0–99)
Triglycerides: 85 mg/dL (ref 0–149)
VLDL Cholesterol Cal: 17 mg/dL (ref 5–40)

## 2018-10-03 LAB — HEMOGLOBIN A1C
Est. average glucose Bld gHb Est-mCnc: 123 mg/dL
Hgb A1c MFr Bld: 5.9 % — ABNORMAL HIGH (ref 4.8–5.6)

## 2018-10-03 NOTE — Patient Instructions (Signed)
Exercising to Lose Weight Exercising can help you to lose weight. In order to lose weight through exercise, you need to do vigorous-intensity exercise. You can tell that you are exercising with vigorous intensity if you are breathing very hard and fast and cannot hold a conversation while exercising. Moderate-intensity exercise helps to maintain your current weight. You can tell that you are exercising at a moderate level if you have a higher heart rate and faster breathing, but you are still able to hold a conversation. How often should I exercise? Choose an activity that you enjoy and set realistic goals. Your health care provider can help you to make an activity plan that works for you. Exercise regularly as directed by your health care provider. This may include:  Doing resistance training twice each week, such as: ? Push-ups. ? Sit-ups. ? Lifting weights. ? Using resistance bands.  Doing a given intensity of exercise for a given amount of time. Choose from these options: ? 150 minutes of moderate-intensity exercise every week. ? 75 minutes of vigorous-intensity exercise every week. ? A mix of moderate-intensity and vigorous-intensity exercise every week.  Children, pregnant women, people who are out of shape, people who are overweight, and older adults may need to consult a health care provider for individual recommendations. If you have any sort of medical condition, be sure to consult your health care provider before starting a new exercise program. What are some activities that can help me to lose weight?  Walking at a rate of at least 4.5 miles an hour.  Jogging or running at a rate of 5 miles per hour.  Biking at a rate of at least 10 miles per hour.  Lap swimming.  Roller-skating or in-line skating.  Cross-country skiing.  Vigorous competitive sports, such as football, basketball, and soccer.  Jumping rope.  Aerobic dancing. How can I be more active in my day-to-day  activities?  Use the stairs instead of the elevator.  Take a walk during your lunch break.  If you drive, park your car farther away from work or school.  If you take public transportation, get off one stop early and walk the rest of the way.  Make all of your phone calls while standing up and walking around.  Get up, stretch, and walk around every 30 minutes throughout the day. What guidelines should I follow while exercising?  Do not exercise so much that you hurt yourself, feel dizzy, or get very short of breath.  Consult your health care provider prior to starting a new exercise program.  Wear comfortable clothes and shoes with good support.  Drink plenty of water while you exercise to prevent dehydration or heat stroke. Body water is lost during exercise and must be replaced.  Work out until you breathe faster and your heart beats faster. This information is not intended to replace advice given to you by your health care provider. Make sure you discuss any questions you have with your health care provider. Document Released: 12/12/2010 Document Revised: 04/16/2016 Document Reviewed: 04/12/2014 Elsevier Interactive Patient Education  2018 Elsevier Inc.  

## 2018-10-03 NOTE — Progress Notes (Signed)
Subjective:     Patient ID: Candice Ayala , female    DOB: 1956-05-04 , 62 y.o.   MRN: [id number]   Chief Complaint  Patient presents with  . Annual Exam    HPI  She is here today for a full physical examination. She is followed by Laurin Coder, NP at [facility] for her pelvic exams. Her last visit was earlier this year.     Past Medical History:  Diagnosis Date  . Arthritis   . GERD (gastroesophageal reflux disease)   . PONV (postoperative nausea and vomiting)      Family History  Problem Relation Age of Onset  . Cancer Mother        colon  . Hypertension Brother   . Congestive Heart Failure Brother      Current Outpatient Medications:  .  Calcium Carbonate Antacid (TUMS PO), Take by mouth., Disp: , Rfl:  .  cholecalciferol (VITAMIN D) 1000 UNITS tablet, Take 1,000 Units by mouth daily., Disp: , Rfl:  .  Garlic 10 MG CAPS, Take by mouth., Disp: , Rfl:  .  Multiple Vitamins-Minerals (MULTIVITAMIN WITH MINERALS) tablet, Take 1 tablet by mouth daily., Disp: , Rfl:  .  alendronate (FOSAMAX) 70 MG tablet, TAKE 1 (ONE) TABLET WEEKLY AS DIRECTED, Disp: , Rfl: 11   Allergies  Allergen Reactions  . Contrast Media [Iodinated Diagnostic Agents] Hives      No LMP recorded. Patient is postmenopausal.. Negative for: breast discharge, breast lump(s), breast pain and breast self exam. Associated symptoms include abnormal vaginal bleeding. Pertinent negatives include abnormal bleeding (hematology), anxiety, decreased libido, depression, difficulty falling sleep, dyspareunia, history of infertility, nocturia, sexual dysfunction, sleep disturbances, urinary incontinence, urinary urgency, vaginal discharge and vaginal itching. Diet regular.The patient states her exercise level is  Minimal.   . The patient's tobacco use is:  Social History   Tobacco Use  Smoking Status Never Smoker  Smokeless Tobacco Never Used  . She has been exposed to passive smoke. The patient's alcohol use  is:  Social History   Substance and Sexual Activity  Alcohol Use No  . Additional information: Last pap Jan 2019 (?)   Review of Systems  Constitutional: Negative.   HENT: Negative.   Eyes: Negative.   Respiratory: Negative.   Cardiovascular: Negative.   Gastrointestinal: Negative.   Endocrine: Negative.   Genitourinary: Negative.   Musculoskeletal: Negative.   Skin: Negative.   Allergic/Immunologic: Negative.   Neurological: Negative.   Hematological: Negative.   Psychiatric/Behavioral: Negative.      Today's Vitals   10/03/18 0915  BP: 114/70  Pulse: 92  Temp: 98.5 F (36.9 C)  TempSrc: Oral  Weight: 213 lb (96.6 kg)  Height: 5\' 5"  (1.651 m)   Body mass index is 35.45 kg/m.   Objective:  Physical Exam  Constitutional: She is oriented to person, place, and time. She appears well-developed and well-nourished.  HENT:  Head: Normocephalic and atraumatic.  Right Ear: External ear normal.  Left Ear: External ear normal.  Nose: Nose normal.  Mouth/Throat: Oropharynx is clear and moist.  Eyes: Pupils are equal, round, and reactive to light. Conjunctivae and EOM are normal.  Neck: Normal range of motion. Neck supple.  Cardiovascular: Normal rate, regular rhythm, normal heart sounds and intact distal pulses.  Pulmonary/Chest: Effort normal and breath sounds normal.  She elected to keep her bra on for exam. Breast exam not performed.   Abdominal: Soft. Bowel sounds are normal.  Genitourinary:  Genitourinary Comments: deferred  Musculoskeletal: Normal range of motion.  Neurological: She is alert and oriented to person, place, and time.  Skin: Skin is warm and dry.  Psychiatric: She has a normal mood and affect.  Nursing note and vitals reviewed.       Assessment And Plan:     1. Routine general medical examination at health care facility  A full exam was performed. Importance of monthly self breast exams was discussed with the patient. Last mammo reviewed. I  will request last colonoscopy from Dr. Collene Mares. She will be given Tdap at her next visit.  PATIENT HAS BEEN ADVISED TO GET 30-45 MINUTES REGULAR EXERCISE NO LESS THAN FOUR TO FIVE DAYS PER WEEK - BOTH WEIGHTBEARING EXERCISES AND AEROBIC ARE RECOMMENDED.  SHE IS ADVISED TO FOLLOW A HEALTHY DIET WITH AT LEAST SIX FRUITS/VEGGIES PER DAY, DECREASE INTAKE OF RED MEAT, AND TO INCREASE FISH INTAKE TO TWO DAYS PER WEEK.  MEATS/FISH SHOULD NOT BE FRIED, BAKED OR BROILED IS PREFERABLE.  I SUGGEST WEARING SPF 50 SUNSCREEN ON EXPOSED PARTS AND ESPECIALLY WHEN IN THE DIRECT SUNLIGHT FOR AN EXTENDED PERIOD OF TIME.  PLEASE AVOID FAST FOOD RESTAURANTS AND INCREASE YOUR WATER INTAKE.    2. Need for vaccination  - Flu Vaccine QUAD 6+ mos PF IM (Fluarix Quad PF)        Maximino Greenland, MD

## 2018-10-03 NOTE — Addendum Note (Signed)
Addended by: Electa Sniff on: 10/03/2018 11:35 AM   Modules accepted: Orders

## 2018-10-03 NOTE — Addendum Note (Signed)
Addended by: Maximino Greenland on: 10/03/2018 10:06 AM   Modules accepted: Orders

## 2018-10-04 NOTE — Progress Notes (Signed)
Your sodium level is sl. Elevated, be sure to stay well hydrated. Your liver and kidney fxn are nl. Your blood count is nl. Your LDL, bad chol is 180.  It should be less than 100. I would like to start you on a chol medication. I also suggest you avoid fried foods, increase exercise no less than five days weekly for 30 minutes or more.  Your hba1c is 5.9, this is in predm range.

## 2018-10-17 ENCOUNTER — Encounter: Payer: Self-pay | Admitting: Nurse Practitioner

## 2018-10-17 ENCOUNTER — Ambulatory Visit: Payer: BLUE CROSS/BLUE SHIELD | Admitting: Nurse Practitioner

## 2018-10-17 VITALS — BP 140/80 | HR 78 | Temp 98.2°F | Ht 65.0 in | Wt 209.8 lb

## 2018-10-17 DIAGNOSIS — R059 Cough, unspecified: Secondary | ICD-10-CM

## 2018-10-17 DIAGNOSIS — J0110 Acute frontal sinusitis, unspecified: Secondary | ICD-10-CM | POA: Diagnosis not present

## 2018-10-17 DIAGNOSIS — R05 Cough: Secondary | ICD-10-CM | POA: Diagnosis not present

## 2018-10-17 MED ORDER — AMOXICILLIN-POT CLAVULANATE 875-125 MG PO TABS: 1.0000 | ORAL_TABLET | Freq: Two times a day (BID) | ORAL | 0 refills | Status: AC

## 2018-10-17 MED ORDER — BENZONATATE 100 MG PO CAPS: 100.0000 mg | ORAL_CAPSULE | Freq: Four times a day (QID) | ORAL | 1 refills | Status: AC | PRN

## 2018-10-17 MED ORDER — MOMETASONE FUROATE 50 MCG/ACT NA SUSP
2.0000 | Freq: Every day | NASAL | 2 refills | Status: DC
Start: 2018-10-17 — End: 2020-01-16

## 2018-10-17 NOTE — Patient Instructions (Signed)
Sinusitis, Adult Sinusitis is soreness and inflammation of your sinuses. Sinuses are hollow spaces in the bones around your face. They are located:  Around your eyes.  In the middle of your forehead.  Behind your nose.  In your cheekbones.  Your sinuses and nasal passages are lined with a stringy fluid (mucus). Mucus normally drains out of your sinuses. When your nasal tissues get inflamed or swollen, the mucus can get trapped or blocked so air cannot flow through your sinuses. This lets bacteria, viruses, and funguses grow, and that leads to infection. Follow these instructions at home: Medicines  Take, use, or apply over-the-counter and prescription medicines only as told by your doctor. These may include nasal sprays.  If you were prescribed an antibiotic medicine, take it as told by your doctor. Do not stop taking the antibiotic even if you start to feel better. Hydrate and Humidify  Drink enough water to keep your pee (urine) clear or pale yellow.  Use a cool mist humidifier to keep the humidity level in your home above 50%.  Breathe in steam for 10-15 minutes, 3-4 times a day or as told by your doctor. You can do this in the bathroom while a hot shower is running.  Try not to spend time in cool or dry air. Rest  Rest as much as possible.  Sleep with your head raised (elevated).  Make sure to get enough sleep each night. General instructions  Put a warm, moist washcloth on your face 3-4 times a day or as told by your doctor. This will help with discomfort.  Wash your hands often with soap and water. If there is no soap and water, use hand sanitizer.  Do not smoke. Avoid being around people who are smoking (secondhand smoke).  Keep all follow-up visits as told by your doctor. This is important. Contact a doctor if:  You have a fever.  Your symptoms get worse.  Your symptoms do not get better within 10 days. Get help right away if:  You have a very bad  headache.  You cannot stop throwing up (vomiting).  You have pain or swelling around your face or eyes.  You have trouble seeing.  You feel confused.  Your neck is stiff.  You have trouble breathing. This information is not intended to replace advice given to you by your health care provider. Make sure you discuss any questions you have with your health care provider. Document Released: 04/27/2008 Document Revised: 07/05/2016 Document Reviewed: 09/04/2015 Elsevier Interactive Patient Education  2018 [place] EVERY 6 HOURS AS NEEDED FOR THE FULLNESS IN YOUR EARS  TAKE ANTIBIOTIC TWICE A DAY  TAKE NASALSPRAY DAILY AS NEEDED  TESSALON PERLES AS NEEDED FOR THE COUGH

## 2018-10-17 NOTE — Progress Notes (Signed)
Subjective:     Patient ID: Candice Ayala , female    DOB: 11-03-1956 , 62 y.o.   MRN: [id number]   Chief Complaint  Patient presents with  . Nasal Congestion    C/O facial pain , coughing , post nasal drip for the past 3 days     HPI  Sinus Problem  This is a new problem. The current episode started in the past 7 days. The problem has been gradually worsening since onset. There has been no fever. She is experiencing no pain. Associated symptoms include congestion, coughing, headaches and sinus pressure. Pertinent negatives include no chills or shortness of breath. Past treatments include oral decongestants (nettie pot, normal saline nasal spray). The treatment provided no relief.     Past Medical History:  Diagnosis Date  . Arthritis   . GERD (gastroesophageal reflux disease)   . PONV (postoperative nausea and vomiting)      Family History  Problem Relation Age of Onset  . Cancer Mother        colon  . Hypertension Brother   . Congestive Heart Failure Brother      Current Outpatient Medications:  .  alendronate (FOSAMAX) 70 MG tablet, TAKE 1 (ONE) TABLET WEEKLY AS DIRECTED, Disp: , Rfl: 11 .  Calcium Carbonate Antacid (TUMS PO), Take by mouth., Disp: , Rfl:  .  cholecalciferol (VITAMIN D) 1000 UNITS tablet, Take 1,000 Units by mouth daily., Disp: , Rfl:  .  Garlic 10 MG CAPS, Take by mouth., Disp: , Rfl:  .  Multiple Vitamins-Minerals (MULTIVITAMIN WITH MINERALS) tablet, Take 1 tablet by mouth daily., Disp: , Rfl:    Allergies  Allergen Reactions  . Contrast Media [Iodinated Diagnostic Agents] Hives     Review of Systems  Constitutional: Negative for chills and fatigue.  HENT: Positive for congestion and sinus pressure.   Eyes: Negative.   Respiratory: Positive for cough. Negative for shortness of breath.   Cardiovascular: Negative.  Negative for chest pain, palpitations and leg swelling.  Gastrointestinal: Positive for nausea. Negative for vomiting.  Skin:  Negative.   Allergic/Immunologic: Negative.   Neurological: Positive for headaches.     Today's Vitals   10/17/18 1200  BP: 140/80  Pulse: 78  Temp: 98.2 F (36.8 C)  TempSrc: Oral  SpO2: 94%  Weight: 209 lb 12.8 oz (95.2 kg)  Height: 5\' 5"  (1.651 m)   Body mass index is 34.91 kg/m.   Objective:  Physical Exam Vitals signs reviewed.  Constitutional:      Appearance: She is well-developed and well-nourished.  HENT:     Head: Normocephalic and atraumatic.     Right Ear: External ear normal.     Left Ear: External ear normal.     Nose:     Right Sinus: Frontal sinus tenderness present.     Left Sinus: Frontal sinus tenderness present.     Mouth/Throat:     Mouth: Oropharynx is clear and moist.  Eyes:     Extraocular Movements: EOM normal.     Conjunctiva/sclera: Conjunctivae normal.     Pupils: Pupils are equal, round, and reactive to light.  Neck:     Musculoskeletal: Normal range of motion and neck supple.  Cardiovascular:     Rate and Rhythm: Normal rate and regular rhythm.     Heart sounds: Normal heart sounds.  Pulmonary:     Effort: Pulmonary effort is normal.     Breath sounds: Normal breath sounds.  Skin:  General: Skin is warm and dry.  Neurological:     Mental Status: She is alert and oriented to person, place, and time.  Psychiatric:        Mood and Affect: Mood and affect normal.         Assessment And Plan:    1. Acute non-recurrent frontal sinusitis  Frontal sinuses are tender bilaterally - amoxicillin-clavulanate (AUGMENTIN) 875-125 MG tablet; Take 1 tablet by mouth 2 (two) times daily for 7 days.  Dispense: 14 tablet; Refill: 0  2. Cough  Symptomatic treatment  If related to a bacteria the augmentin will treat effectively as well - benzonatate (TESSALON PERLES) 100 MG capsule; Take 1 capsule (100 mg total) by mouth every 6 (six) hours as needed for cough.  Dispense: 30 capsule; Refill: Clarence, FNP

## 2018-11-02 ENCOUNTER — Other Ambulatory Visit: Payer: Self-pay | Admitting: Nurse Practitioner

## 2018-11-02 ENCOUNTER — Telehealth: Payer: Self-pay

## 2018-11-02 ENCOUNTER — Other Ambulatory Visit: Payer: Self-pay

## 2018-11-02 DIAGNOSIS — N760 Acute vaginitis: Secondary | ICD-10-CM

## 2018-11-02 MED ORDER — FLUCONAZOLE 150 MG PO TABS: ORAL_TABLET | ORAL | 0 refills | Status: DC

## 2018-11-02 MED ORDER — FLUTICASONE PROPIONATE 50 MCG/ACT NA SUSP: 2.0000 | Freq: Every day | NASAL | 6 refills | Status: DC

## 2018-11-02 NOTE — Telephone Encounter (Signed)
Patient called stating she was here before thanksgiving for a sinus infection and was giving amoxicillin and she now feels like she has a yeast infection and she is itching all over but does not see a rash or have a fever.   Returned pt call advised her we sent her a prescription to the pharmacy and advised her to take benadryl for a few days and if the itching continues she has been advised to call the office if no improvements. YRL,RMA

## 2018-11-21 ENCOUNTER — Encounter: Payer: Self-pay | Admitting: Internal Medicine

## 2018-11-22 ENCOUNTER — Encounter: Payer: Self-pay | Admitting: Internal Medicine

## 2019-01-16 DIAGNOSIS — Z1231 Encounter for screening mammogram for malignant neoplasm of breast: Secondary | ICD-10-CM | POA: Diagnosis not present

## 2019-01-16 DIAGNOSIS — Z803 Family history of malignant neoplasm of breast: Secondary | ICD-10-CM | POA: Diagnosis not present

## 2019-01-18 DIAGNOSIS — Z01419 Encounter for gynecological examination (general) (routine) without abnormal findings: Secondary | ICD-10-CM | POA: Diagnosis not present

## 2019-01-18 DIAGNOSIS — Z1151 Encounter for screening for human papillomavirus (HPV): Secondary | ICD-10-CM | POA: Diagnosis not present

## 2019-01-18 DIAGNOSIS — Z6834 Body mass index (BMI) 34.0-34.9, adult: Secondary | ICD-10-CM | POA: Diagnosis not present

## 2019-04-07 ENCOUNTER — Encounter: Payer: Self-pay | Admitting: Internal Medicine

## 2019-08-02 ENCOUNTER — Other Ambulatory Visit: Payer: Self-pay

## 2019-08-02 ENCOUNTER — Encounter: Payer: Self-pay | Admitting: Internal Medicine

## 2019-08-02 ENCOUNTER — Telehealth (INDEPENDENT_AMBULATORY_CARE_PROVIDER_SITE_OTHER): Payer: Medicare Other | Admitting: Internal Medicine

## 2019-08-02 VITALS — Temp 97.7°F

## 2019-08-02 DIAGNOSIS — J0100 Acute maxillary sinusitis, unspecified: Secondary | ICD-10-CM

## 2019-08-02 DIAGNOSIS — H9201 Otalgia, right ear: Secondary | ICD-10-CM

## 2019-08-02 MED ORDER — AZITHROMYCIN 250 MG PO TABS: ORAL_TABLET | ORAL | 0 refills | Status: AC

## 2019-08-02 MED ORDER — CHLORPHENIRAMINE MALEATE 4 MG PO TABS: 4.0000 mg | ORAL_TABLET | Freq: Two times a day (BID) | ORAL | 0 refills | Status: DC | PRN

## 2019-08-02 NOTE — Progress Notes (Signed)
Virtual Visit via Video   This visit type was conducted due to national recommendations for restrictions regarding the COVID-19 Pandemic (e.g. social distancing) in an effort to limit this patient's exposure and mitigate transmission in our community.  Due to her co-morbid illnesses, this patient is at least at moderate risk for complications without adequate follow up.  This format is felt to be most appropriate for this patient at this time.  All issues noted in this document were discussed and addressed.  A limited physical exam was performed with this format.    This visit type was conducted due to national recommendations for restrictions regarding the COVID-19 Pandemic (e.g. social distancing) in an effort to limit this patient's exposure and mitigate transmission in our community.  Patients identity confirmed using two different identifiers.  This format is felt to be most appropriate for this patient at this time.  All issues noted in this document were discussed and addressed.  No physical exam was performed (except for noted visual exam findings with Video Visits).    Date:  08/02/2019   ID:  Candice Ayala, DOB 03/19/1956, MRN [id number]  Patient Location:  Work  Provider location:   [employer] Complaint:  "I have ear pain and sinus infection"  History of Present Illness:    Candice Ayala is a 63 y.o. female who presents via video conferencing for a telehealth visit today.    The patient does have symptoms concerning for COVID-19 infection (fever, chills, cough, or new shortness of breath).   She presents today for virtual visit. She prefers this method of contact due to COVID-19 pandemic. She presents today for further evaluation of sinus congestion/cough/ear pain. Her symptoms started about five days ago. Pharmacist suggested sudafed, this did not provide relief. She did not want to take for too many days b/c she did not want it to affect her blood pressure.    Sinus Problem This is a new problem. There has been no fever. The fever has been present for 5 days or more. Her pain is at a severity of 4/10. The pain is moderate. Associated symptoms include congestion, coughing, ear pain and sinus pressure. Pertinent negatives include no chills or sneezing. Past treatments include acetaminophen and oral decongestants. The treatment provided no relief.     Past Medical History:  Diagnosis Date  . Arthritis   . GERD (gastroesophageal reflux disease)   . PONV (postoperative nausea and vomiting)    Past Surgical History:  Procedure Laterality Date  . BREAST BIOPSY Right 11/27/2013   Procedure: RIGHT BREAST BIOPSY WITH NEEDLE LOCALIZATION;  Surgeon: Odis Hollingshead, MD;  Location: [facility];  Service: General;  Laterality: Right;  . COLONOSCOPY    . JOINT REPLACEMENT  2004   right total hip  . OVARIAN CYST REMOVAL  1980     Current Meds  Medication Sig  . Calcium Carbonate Antacid (TUMS PO) Take by mouth.  . cholecalciferol (VITAMIN D) 1000 UNITS tablet Take 1,000 Units by mouth daily.  . mometasone (NASONEX) 50 MCG/ACT nasal spray Place 2 sprays into the nose daily.  . Multiple Vitamins-Minerals (MULTIVITAMIN WITH MINERALS) tablet Take 1 tablet by mouth daily.     Allergies:   Contrast media [iodinated diagnostic agents]   Social History   Tobacco Use  . Smoking status: Never Smoker  . Smokeless tobacco: Never Used  Substance Use Topics  . Alcohol use: No  . Drug use: No  Family Hx: The patient's family history includes Cancer in her mother; Congestive Heart Failure in her brother; Hypertension in her brother.  ROS:   Please see the history of present illness.    Review of Systems  Constitutional: Negative.  Negative for chills and fever.  HENT: Positive for congestion, ear pain and sinus pressure. Negative for sneezing.   Respiratory: Positive for cough.   Cardiovascular: Negative.   Gastrointestinal:  Negative.   Neurological: Negative.   Psychiatric/Behavioral: Negative.     All other systems reviewed and are negative.   Labs/Other Tests and Data Reviewed:    Recent Labs: 10/03/2018: ALT 9; BUN 10; Creatinine, Ser 0.77; Hemoglobin 12.7; Platelets 378; Potassium 4.5; Sodium 145   Recent Lipid Panel Lab Results  Component Value Date/Time   CHOL 272 (H) 10/03/2018 10:08 AM   TRIG 85 10/03/2018 10:08 AM   HDL 75 10/03/2018 10:08 AM   CHOLHDL 3.6 10/03/2018 10:08 AM   LDLCALC 180 (H) 10/03/2018 10:08 AM    Wt Readings from Last 3 Encounters:  10/17/18 209 lb 12.8 oz (95.2 kg)  10/03/18 213 lb (96.6 kg)  12/11/13 212 lb 6.4 oz (96.3 kg)     Exam:    Vital Signs:  Temp 97.7 F (36.5 C)     Physical Exam  Constitutional: She is oriented to person, place, and time and well-developed, well-nourished, and in no distress.  Sounds congested  HENT:  Head: Normocephalic and atraumatic.  Eyes: EOM are normal.  Neck: Normal range of motion.  Pulmonary/Chest: Effort normal.  Neurological: She is alert and oriented to person, place, and time.  Psychiatric: Affect normal.  Nursing note and vitals reviewed.   ASSESSMENT & PLAN:     1. Acute non-recurrent maxillary sinusitis  She reports itching after rx Augmentin last year. I will send in rx Zpak, use as directed. She is encouraged to complete full abx course. She will let me know if her sx persist. She does not wish to go for COVID testing at this time.   2. Otalgia of right ear  I will send in rx chlorpheniramine to the pharmacy. I suspect her sx will improve with use of abx.     COVID-19 Education: The signs and symptoms of COVID-19 were discussed with the patient and how to seek care for testing (follow up with PCP or arrange E-visit).  The importance of social distancing was discussed today.  Patient Risk:   After full review of this patients clinical status, I feel that they are at least moderate risk at this  time.    Medication Adjustments/Labs and Tests Ordered: Current medicines are reviewed at length with the patient today.  Concerns regarding medicines are outlined above.   Tests Ordered: No orders of the defined types were placed in this encounter.   Medication Changes: Meds ordered this encounter  Medications  . azithromycin (ZITHROMAX Z-PAK) 250 MG tablet    Sig: Take 2 tablets (500 mg) on  Day 1,  followed by 1 tablet (250 mg) once daily on Days 2 through 5.    Dispense:  6 each    Refill:  0  . chlorpheniramine (CHLOR-TRIMETON) 4 MG tablet    Sig: Take 1 tablet (4 mg total) by mouth 2 (two) times daily as needed for allergies.    Dispense:  14 tablet    Refill:  0    Disposition:  Follow up prn  Signed, Maximino Greenland, MD

## 2019-08-02 NOTE — Patient Instructions (Signed)

## 2019-08-30 ENCOUNTER — Encounter: Payer: Self-pay | Admitting: Internal Medicine

## 2019-10-12 ENCOUNTER — Encounter: Payer: Medicare Other | Admitting: Internal Medicine

## 2019-11-22 ENCOUNTER — Telehealth: Payer: Self-pay

## 2019-11-22 NOTE — Telephone Encounter (Signed)
Pt called to say that she had been exposed at work. Pt is not experiencing any symptoms. Explained to pt that she should go get tested. Explained that she should go to the ER if she started having shortness of breath or chest pain

## 2019-11-27 ENCOUNTER — Ambulatory Visit: Payer: Medicare Other | Attending: Internal Medicine

## 2019-11-27 DIAGNOSIS — Z20822 Contact with and (suspected) exposure to covid-19: Secondary | ICD-10-CM

## 2019-11-28 LAB — NOVEL CORONAVIRUS, NAA: SARS-CoV-2, NAA: NOT DETECTED

## 2019-12-14 ENCOUNTER — Ambulatory Visit: Payer: Medicare Other | Attending: Internal Medicine

## 2019-12-14 DIAGNOSIS — Z23 Encounter for immunization: Secondary | ICD-10-CM

## 2019-12-14 NOTE — Progress Notes (Signed)
   Covid-19 Vaccination Clinic  Name:  Candice Ayala    MRN: [id number] DOB: 1956/05/21  12/14/2019  [name] was observed post Covid-19 immunization for 15 minutes without incidence. She was provided with Vaccine Information Sheet and instruction to access the V-Safe system.   [name] was instructed to call 911 with any severe reactions post vaccine: Marland Kitchen Difficulty breathing  . Swelling of your face and throat  . A fast heartbeat  . A bad rash all over your body  . Dizziness and weakness    Immunizations Administered    Name Date Dose VIS Date Route   Pfizer COVID-19 Vaccine 12/14/2019  9:02 AM 0.3 mL 11/03/2019 Intramuscular   Manufacturer: [place]   Lot: [id number]   [place]: [id number]

## 2019-12-19 ENCOUNTER — Ambulatory Visit (INDEPENDENT_AMBULATORY_CARE_PROVIDER_SITE_OTHER): Payer: Medicare Other | Admitting: Internal Medicine

## 2019-12-19 ENCOUNTER — Encounter: Payer: Self-pay | Admitting: Internal Medicine

## 2019-12-19 ENCOUNTER — Other Ambulatory Visit: Payer: Self-pay

## 2019-12-19 VITALS — BP 174/96 | HR 79 | Temp 98.5°F | Ht 65.0 in | Wt 220.0 lb

## 2019-12-19 DIAGNOSIS — I10 Essential (primary) hypertension: Secondary | ICD-10-CM | POA: Diagnosis not present

## 2019-12-19 DIAGNOSIS — E2839 Other primary ovarian failure: Secondary | ICD-10-CM | POA: Diagnosis not present

## 2019-12-19 DIAGNOSIS — Z0000 Encounter for general adult medical examination without abnormal findings: Secondary | ICD-10-CM

## 2019-12-19 DIAGNOSIS — J0100 Acute maxillary sinusitis, unspecified: Secondary | ICD-10-CM | POA: Diagnosis not present

## 2019-12-19 DIAGNOSIS — E6609 Other obesity due to excess calories: Secondary | ICD-10-CM

## 2019-12-19 DIAGNOSIS — Z6836 Body mass index (BMI) 36.0-36.9, adult: Secondary | ICD-10-CM

## 2019-12-19 DIAGNOSIS — R7309 Other abnormal glucose: Secondary | ICD-10-CM | POA: Diagnosis not present

## 2019-12-19 LAB — POCT UA - MICROALBUMIN
Albumin/Creatinine Ratio, Urine, POC: 30
Creatinine, POC: 50 mg/dL
Microalbumin Ur, POC: 10 mg/L

## 2019-12-19 LAB — POCT URINALYSIS DIPSTICK
Bilirubin, UA: NEGATIVE
Glucose, UA: NEGATIVE
Ketones, UA: NEGATIVE
Leukocytes, UA: NEGATIVE
Nitrite, UA: NEGATIVE
Protein, UA: NEGATIVE
Spec Grav, UA: 1.02 (ref 1.010–1.025)
Urobilinogen, UA: 0.2 E.U./dL
pH, UA: 6.5 (ref 5.0–8.0)

## 2019-12-19 MED ORDER — VALSARTAN-HYDROCHLOROTHIAZIDE 160-12.5 MG PO TABS: 1.0000 | ORAL_TABLET | Freq: Every day | ORAL | 1 refills | Status: DC

## 2019-12-19 MED ORDER — AZITHROMYCIN 250 MG PO TABS: ORAL_TABLET | ORAL | 0 refills | Status: AC

## 2019-12-19 NOTE — Progress Notes (Signed)
Subjective:    Candice Ayala is a 64 y.o. female who presents for a Welcome to Medicare exam.   She is here today for a physical exam. She did not realize she was having AWV today. She reports compliance with meds.    Review of Systems  Review of Systems  Constitutional: Negative.   HENT: Positive for congestion and sinus pain.        She reports she had neg COVID test on 1/4.   Respiratory: Negative.   Cardiovascular: Negative.   Gastrointestinal: Negative.   Genitourinary: Negative.   Musculoskeletal: Negative.   Neurological: Negative.   Endo/Heme/Allergies: Negative.   Psychiatric/Behavioral: Negative.     Cardiac Risk Factors include: advanced age (>72mn, >>45women)      Objective:    Today's Vitals   12/19/19 1034 12/19/19 1117  BP: (!) 174/96   Pulse: 79   Temp: 98.5 F (36.9 C)   TempSrc: Oral   Weight: 220 lb (99.8 kg)   Height: '5\' 5"'  (1.651 m)   PainSc: 0-No pain 3   Body mass index is 36.61 kg/m.  Medications Outpatient Encounter Medications as of 12/19/2019  Medication Sig  . Calcium Carbonate-Vitamin D (CALTRATE 600+D PO) Take by mouth.  . chlorpheniramine (CHLOR-TRIMETON) 4 MG tablet Take 1 tablet (4 mg total) by mouth 2 (two) times daily as needed for allergies.  . cholecalciferol (VITAMIN D) 1000 UNITS tablet Take 1,000 Units by mouth daily.  . fluticasone (FLONASE) 50 MCG/ACT nasal spray Place 2 sprays into both nostrils daily.  . Multiple Vitamins-Minerals (MULTIVITAMIN WITH MINERALS) tablet Take 1 tablet by mouth daily.  . Multiple Vitamins-Minerals (ZINC PO) Take by mouth. 1 daily  . azithromycin (ZITHROMAX) 250 MG tablet Take 2 tablets (500 mg) on  Day 1,  followed by 1 tablet (250 mg) once daily on Days 2 through 5.  . mometasone (NASONEX) 50 MCG/ACT nasal spray Place 2 sprays into the nose daily.  . valsartan-hydrochlorothiazide (DIOVAN HCT) 160-12.5 MG tablet Take 1 tablet by mouth daily.  . [DISCONTINUED] alendronate (FOSAMAX) 70  MG tablet TAKE 1 (ONE) TABLET WEEKLY AS DIRECTED  . [DISCONTINUED] Calcium Carbonate Antacid (TUMS PO) Take by mouth.  . [DISCONTINUED] Garlic 10 MG CAPS Take by mouth.   No facility-administered encounter medications on file as of 12/19/2019.     History: Past Medical History:  Diagnosis Date  . Allergy   . Arthritis   . GERD (gastroesophageal reflux disease)   . PONV (postoperative nausea and vomiting)    Past Surgical History:  Procedure Laterality Date  . BREAST BIOPSY Right 11/27/2013   Procedure: RIGHT BREAST BIOPSY WITH NEEDLE LOCALIZATION;  Surgeon: TOdis Hollingshead MD;  Location: [facility]  Service: General;  Laterality: Right;  . COLONOSCOPY    . JOINT REPLACEMENT  2004   right total hip  . OVARIAN CYST REMOVAL  1980    Family History  Problem Relation Age of Onset  . Cancer Mother        colon  . Early death Father   . Hypertension Brother   . Congestive Heart Failure Brother    Social History   Occupational History  . Not on file  Tobacco Use  . Smoking status: Never Smoker  . Smokeless tobacco: Never Used  . Tobacco comment: She is a non-smoker  Substance and Sexual Activity  . Alcohol use: No  . Drug use: No  . Sexual activity: Not Currently    Tobacco  Counseling Counseling given: No Comment: She is a non-smoker   Immunizations and Health Maintenance Immunization History  Administered Date(s) Administered  . Influenza,inj,Quad PF,6+ Mos 10/03/2018  . Influenza-Unspecified 08/22/2019  . PFIZER SARS-COV-2 Vaccination 12/14/2019  . Pneumococcal Polysaccharide-23 08/22/2019   Health Maintenance Due  Topic Date Due  . TETANUS/TDAP  02/17/1973  . PAP SMEAR-Modifier  02/18/1975    Activities of Daily Living In your present state of health, do you have any difficulty performing the following activities: 12/19/2019  Hearing? N  Vision? N  Difficulty concentrating or making decisions? N  Walking or climbing stairs? N  Dressing  or bathing? N  Doing errands, shopping? N  Preparing Food and eating ? N  Using the Toilet? N  In the past six months, have you accidently leaked urine? Y  Do you have problems with loss of bowel control? N  Managing your Medications? N  Managing your Finances? N  Housekeeping or managing your Housekeeping? N  Some recent data might be hidden    Physical Exam    Physical Exam  Constitutional: She is oriented to person, place, and time and well-developed, well-nourished, and in no distress.  HENT:  Head: Normocephalic and atraumatic.  Right Ear: External ear normal.  Left Ear: External ear normal.  Nose: Nose normal.  Mouth/Throat: Oropharynx is clear and moist.  Maxillary sinus tenderness to percussion. Nose/Mouth exam deferred, patient masked.   Eyes: Pupils are equal, round, and reactive to light. Conjunctivae and EOM are normal.  Cardiovascular: Normal rate, regular rhythm and normal heart sounds.  Pulmonary/Chest: Effort normal and breath sounds normal.  Abdominal: Soft. Bowel sounds are normal.  Obese, soft.   Genitourinary:    Genitourinary Comments: Deferred.    Musculoskeletal:        General: Normal range of motion.     Cervical back: Normal range of motion.  Neurological: She is alert and oriented to person, place, and time.  Skin: Skin is warm and dry.  Psychiatric: Affect normal.  Nursing note and vitals reviewed.   Advanced Directives: Does Patient Have a Medical Advance Directive?: No Would patient like information on creating a medical advance directive?: Yes (ED - Information included in AVS)    Assessment:    This is a routine wellness examination for this patient .   Vision/Hearing screen  Hearing Screening   '125Hz'  '250Hz'  '500Hz'  '1000Hz'  '2000Hz'  '3000Hz'  '4000Hz'  '6000Hz'  '8000Hz'   Right ear:  '25 25 25 25  25    ' Left ear:  '25 25 25 25  25      ' Visual Acuity Screening   Right eye Left eye Both eyes  Without correction: '20/30 20/30 20/30 '  With correction:        Dietary issues and exercise activities discussed:  Current Exercise Habits: Home exercise routine(pt said she walks sometimes), Type of exercise: walking, Time (Minutes): 20, Frequency (Times/Week): 2, Weekly Exercise (Minutes/Week): 40, Intensity: Mild, Exercise limited by: None identified  Goals    . Exercise 150 min/wk Moderate Activity    . Weight (lb) < 200 lb (90.7 kg)      Depression Screen PHQ 2/9 Scores 12/19/2019  PHQ - 2 Score 0     Fall Risk Fall Risk  12/19/2019  Falls in the past year? 0    Cognitive Function:     6CIT Screen 12/19/2019  What Year? 0 points  What month? 0 points  What time? 0 points  Count back from 20 0 points  Months in reverse  0 points  Repeat phrase 0 points  Total Score 0    Patient Care Team: Glendale Chard, MD as PCP - General (Internal Medicine)     Plan:   1. Welcome to Medicare preventive visit  A full exam was performed. Importance of monthly self breast exams was discussed with the patient The Welcome to Medicare visit was performed including discussion of advanced directives, assessment of functional status and cognitive function. PATIENT IS ADVISED TO GET 30-45 MINUTES REGULAR EXERCISE NO LESS THAN FOUR TO FIVE DAYS PER WEEK - BOTH WEIGHTBEARING EXERCISES AND AEROBIC ARE RECOMMENDED.  SHE IS ADVISED TO FOLLOW A HEALTHY DIET WITH AT LEAST SIX FRUITS/VEGGIES PER DAY, DECREASE INTAKE OF RED MEAT, AND TO INCREASE FISH INTAKE TO TWO DAYS PER WEEK.  MEATS/FISH SHOULD NOT BE FRIED, BAKED OR BROILED IS PREFERABLE.  I SUGGEST WEARING SPF 50 SUNSCREEN ON EXPOSED PARTS AND ESPECIALLY WHEN IN THE DIRECT SUNLIGHT FOR AN EXTENDED PERIOD OF TIME.  PLEASE AVOID FAST FOOD RESTAURANTS AND INCREASE YOUR WATER INTAKE.  - EKG 12-Lead  2. Essential hypertension  Uncontrolled. She has been on Bp meds in the past, but stopped when her BP normalized. I will start valsartan 160/12.32m 1/2 tab po qd x 2 days, then one tab daily. Pt advised of possible  side effects. She is encouraged to avoid adding salt to her foods and to avoid packaged foods which are usually high in sodium. EKG performed, NSR w/o acute changes. She will rto in four weeks for re-evaluation.   - CMP14+EGFR - CBC - Lipid panel - TSH - POCT Urinalysis Dipstick (81002) - POCT UA - Microalbumin  3. Acute maxillary sinusitis, recurrence not specified  She was given rx azithromycin, and encouraged to take full abx course. She declined COVID testing today.   4. Estrogen deficiency  I will refer her to [facility] for bone density exam. I will request that she have mammogram performed on the same day.   5. Other abnormal glucose  HER A1C HAS BEEN ELEVATED IN THE PAST. I WILL CHECK AN A1C, BMET TODAY. SHE WAS ENCOURAGED TO AVOID SUGARY BEVERAGES AND PROCESSED FOODS INCLUDNG BREADS, RICE AND PASTA.  - Hemoglobin A1c  6. Class 2 obesity due to excess calories without serious comorbidity with body mass index (BMI) of 36.0 to 36.9 in adult  She is encouraged to strive for BMI less than 30 to decrease cardiac risk. She is encouraged to incorporate more exercise into her daily routine. Pt advised weight loss will also help to lower her blood pressure.   I have personally reviewed and noted the following in the patient's chart:   . Medical and social history . Use of alcohol, tobacco or illicit drugs  . Current medications and supplements . Functional ability and status . Nutritional status . Physical activity . Advanced directives . List of other physicians . Hospitalizations, surgeries, and ER visits in previous 12 months . Vitals . Screenings to include cognitive, depression, and falls . Referrals and appointments  In addition, I have reviewed and discussed with patient certain preventive protocols, quality metrics, and best practice recommendations. A written personalized care plan for preventive services as well as general preventive health recommendations were provided  to patient.     RMaximino Greenland MD 12/19/2019

## 2019-12-19 NOTE — Patient Instructions (Signed)
  Candice Ayala , Thank you for taking time to come for your Medicare Wellness Visit. I appreciate your ongoing commitment to your health goals. Please review the following plan we discussed and let me know if I can assist you in the future.   These are the goals we discussed: Goals    . Exercise 150 min/wk Moderate Activity    . Weight (lb) < 200 lb (90.7 kg)       This is a list of the screening recommended for you and due dates:  Health Maintenance  Topic Date Due  . HIV Screening  02/17/1969  . Tetanus Vaccine  02/17/1973  . Pap Smear  02/18/1975  . Pneumonia vaccines (2 of 2 - PCV13) 08/21/2020  . Mammogram  01/16/2021  . Colon Cancer Screening  09/04/2026  . Flu Shot  Completed  . DEXA scan (bone density measurement)  Completed  .  Hepatitis C: One time screening is recommended by Center for Disease Control  (CDC) for  adults born from [birth year] through 1967.   Completed

## 2019-12-20 LAB — CBC
Hematocrit: 40.3 % (ref 34.0–46.6)
Hemoglobin: 13.5 g/dL (ref 11.1–15.9)
MCH: 29.3 pg (ref 26.6–33.0)
MCHC: 33.5 g/dL (ref 31.5–35.7)
MCV: 87 fL (ref 79–97)
Platelets: 370 10*3/uL (ref 150–450)
RBC: 4.61 x10E6/uL (ref 3.77–5.28)
RDW: 13.4 % (ref 11.7–15.4)
WBC: 5.9 10*3/uL (ref 3.4–10.8)

## 2019-12-20 LAB — CMP14+EGFR
ALT: 17 IU/L (ref 0–32)
AST: 22 IU/L (ref 0–40)
Albumin/Globulin Ratio: 1.5 (ref 1.2–2.2)
Albumin: 4.4 g/dL (ref 3.8–4.8)
Alkaline Phosphatase: 92 IU/L (ref 39–117)
BUN/Creatinine Ratio: 11 — ABNORMAL LOW (ref 12–28)
BUN: 7 mg/dL — ABNORMAL LOW (ref 8–27)
Bilirubin Total: 0.4 mg/dL (ref 0.0–1.2)
CO2: 25 mmol/L (ref 20–29)
Calcium: 9.9 mg/dL (ref 8.7–10.3)
Chloride: 103 mmol/L (ref 96–106)
Creatinine, Ser: 0.62 mg/dL (ref 0.57–1.00)
GFR calc Af Amer: 109 mL/min/{1.73_m2} (ref >59)
GFR calc non Af Amer: 95 mL/min/{1.73_m2} (ref >59)
Globulin, Total: 2.9 g/dL (ref 1.5–4.5)
Glucose: 88 mg/dL (ref 65–99)
Potassium: 4.5 mmol/L (ref 3.5–5.2)
Sodium: 141 mmol/L (ref 134–144)
Total Protein: 7.3 g/dL (ref 6.0–8.5)

## 2019-12-20 LAB — LIPID PANEL
Chol/HDL Ratio: 3.6 ratio (ref 0.0–4.4)
Cholesterol, Total: 250 mg/dL — ABNORMAL HIGH (ref 100–199)
HDL: 70 mg/dL (ref >39)
LDL Chol Calc (NIH): 158 mg/dL — ABNORMAL HIGH (ref 0–99)
Triglycerides: 123 mg/dL (ref 0–149)
VLDL Cholesterol Cal: 22 mg/dL (ref 5–40)

## 2019-12-20 LAB — HEMOGLOBIN A1C
Est. average glucose Bld gHb Est-mCnc: 131 mg/dL
Hgb A1c MFr Bld: 6.2 % — ABNORMAL HIGH (ref 4.8–5.6)

## 2019-12-20 LAB — TSH: TSH: 1.4 u[IU]/mL (ref 0.450–4.500)

## 2020-01-04 ENCOUNTER — Ambulatory Visit: Payer: Medicare Other | Attending: Internal Medicine

## 2020-01-04 DIAGNOSIS — Z23 Encounter for immunization: Secondary | ICD-10-CM | POA: Insufficient documentation

## 2020-01-04 NOTE — Progress Notes (Signed)
   Covid-19 Vaccination Clinic  Name:  Candice Ayala    MRN: [id number] DOB: [birth year]  01/04/2020  [name] was observed post Covid-19 immunization for 15 minutes without incidence. She was provided with Vaccine Information Sheet and instruction to access the V-Safe system.   [name] was instructed to call 911 with any severe reactions post vaccine: Marland Kitchen Difficulty breathing  . Swelling of your face and throat  . A fast heartbeat  . A bad rash all over your body  . Dizziness and weakness    Immunizations Administered    Name Date Dose VIS Date Route   Pfizer COVID-19 Vaccine 01/04/2020  9:09 AM 0.3 mL 11/03/2019 Intramuscular   Manufacturer: [place]   Lot: [id number]   [place]: [id number]

## 2020-01-11 ENCOUNTER — Other Ambulatory Visit: Payer: Self-pay | Admitting: Internal Medicine

## 2020-01-16 ENCOUNTER — Telehealth: Payer: Self-pay

## 2020-01-16 ENCOUNTER — Telehealth (INDEPENDENT_AMBULATORY_CARE_PROVIDER_SITE_OTHER): Payer: Medicare Other | Admitting: Nurse Practitioner

## 2020-01-16 ENCOUNTER — Encounter: Payer: Self-pay | Admitting: Nurse Practitioner

## 2020-01-16 ENCOUNTER — Other Ambulatory Visit: Payer: Self-pay

## 2020-01-16 DIAGNOSIS — J3089 Other allergic rhinitis: Secondary | ICD-10-CM

## 2020-01-16 DIAGNOSIS — Z20822 Contact with and (suspected) exposure to covid-19: Secondary | ICD-10-CM

## 2020-01-16 DIAGNOSIS — R059 Cough, unspecified: Secondary | ICD-10-CM

## 2020-01-16 DIAGNOSIS — R05 Cough: Secondary | ICD-10-CM | POA: Diagnosis not present

## 2020-01-16 MED ORDER — FLUTICASONE PROPIONATE 50 MCG/ACT NA SUSP: 2.0000 | Freq: Every day | NASAL | 6 refills | Status: DC

## 2020-01-16 MED ORDER — AMOXICILLIN-POT CLAVULANATE 875-125 MG PO TABS: 1.0000 | ORAL_TABLET | Freq: Two times a day (BID) | ORAL | 0 refills | Status: AC

## 2020-01-16 NOTE — Telephone Encounter (Signed)
The pt was scheduled for a virtual with her consent. The pt wanted a refill on an antibiotic that she had last month.

## 2020-01-16 NOTE — Progress Notes (Signed)
Virtual Visit via Video   This visit type was conducted due to national recommendations for restrictions regarding the COVID-19 Pandemic (e.g. social distancing) in an effort to limit this patient's exposure and mitigate transmission in our community.  Due to her co-morbid illnesses, this patient is at least at moderate risk for complications without adequate follow up.  This format is felt to be most appropriate for this patient at this time.  All issues noted in this document were discussed and addressed.  A limited physical exam was performed with this format.    This visit type was conducted due to national recommendations for restrictions regarding the COVID-19 Pandemic (e.g. social distancing) in an effort to limit this patient's exposure and mitigate transmission in our community.  Patients identity confirmed using two different identifiers.  This format is felt to be most appropriate for this patient at this time.  All issues noted in this document were discussed and addressed.  No physical exam was performed (except for noted visual exam findings with Video Visits).    Date:  01/16/2020   ID:  Candice Ayala, Candice Ayala 1956-05-28, MRN [id number]  Patient Location:  Work - spoke with Hedda Slade  Provider location:   Office    Chief Complaint:  Cough, sorethroat  History of Present Illness:    Candice Ayala is a 64 y.o. female who presents via video conferencing for a telehealth visit today.   The patient does not have symptoms concerning for COVID-19 infection (fever, chills, cough, or new shortness of breath).   She was exposed to someone with covid on Tuesday/Wednesday and tested on Saturday.  She does take claritin daily. She will use a nettie pot.   She had the covid vaccine about 2 weeks ago [employer].    URI  This is a new problem. The current episode started in the past 7 days. There has been no fever. Associated symptoms include coughing, ear pain (right ear pain)  and a sore throat. Pertinent negatives include no abdominal pain, chest pain, congestion, headaches (light headache), sneezing or swollen glands. Associated symptoms comments: Her teeth are still hurting.  She will cough up mucous.  She took an antibiotic in January for a sinus infection. . She has tried antihistamine for the symptoms.     Past Medical History:  Diagnosis Date  . Allergy   . Arthritis   . GERD (gastroesophageal reflux disease)   . PONV (postoperative nausea and vomiting)    Past Surgical History:  Procedure Laterality Date  . BREAST BIOPSY Right 11/27/2013   Procedure: RIGHT BREAST BIOPSY WITH NEEDLE LOCALIZATION;  Surgeon: Odis Hollingshead, MD;  Location: [place];  Service: General;  Laterality: Right;  . COLONOSCOPY    . JOINT REPLACEMENT  2004   right total hip  . OVARIAN CYST REMOVAL  1980     No outpatient medications have been marked as taking for the 01/16/20 encounter (Telemedicine) with [name], FNP.     Allergies:   Contrast media [iodinated diagnostic agents]   Social History   Tobacco Use  . Smoking status: Never Smoker  . Smokeless tobacco: Never Used  . Tobacco comment: She is a non-smoker  Substance Use Topics  . Alcohol use: No  . Drug use: No     Family Hx: The patient's family history includes Cancer in her mother; Congestive Heart Failure in her brother; Early death in her father; Hypertension in her brother.  ROS:   Please  see the history of present illness.    Review of Systems  Constitutional: Positive for chills. Negative for fever and malaise/fatigue.  HENT: Positive for ear pain (right ear pain) and sore throat. Negative for congestion and sneezing.   Respiratory: Positive for cough. Negative for shortness of breath.   Cardiovascular: Negative for chest pain.  Gastrointestinal: Negative for abdominal pain.  Neurological: Negative for dizziness and headaches (light headache).  Psychiatric/Behavioral:  Negative.     All other systems reviewed and are negative.   Labs/Other Tests and Data Reviewed:    Recent Labs: 12/19/2019: ALT 17; BUN 7; Creatinine, Ser 0.62; Hemoglobin 13.5; Platelets 370; Potassium 4.5; Sodium 141; TSH 1.400   Recent Lipid Panel Lab Results  Component Value Date/Time   CHOL 250 (H) 12/19/2019 12:01 PM   TRIG 123 12/19/2019 12:01 PM   HDL 70 12/19/2019 12:01 PM   CHOLHDL 3.6 12/19/2019 12:01 PM   LDLCALC 158 (H) 12/19/2019 12:01 PM    Wt Readings from Last 3 Encounters:  12/19/19 220 lb (99.8 kg)  10/17/18 209 lb 12.8 oz (95.2 kg)  10/03/18 213 lb (96.6 kg)     Exam:    Vital Signs:  There were no vitals taken for this visit.    Physical Exam  Constitutional: She is oriented to person, place, and time and well-developed, well-nourished, and in no distress. No distress.  Pulmonary/Chest: Effort normal. No respiratory distress.  Neurological: She is alert and oriented to person, place, and time.  Psychiatric: Mood, memory, affect and judgment normal.    ASSESSMENT & PLAN:    1. Cough  She is okay with the intermittent cough  Reports her covid test was negative and she have the covid vaccine 2nd dose 2 weeks ago  2. Non-seasonal allergic rhinitis, unspecified trigger  Due to the sinus pressure and tooth pain concerned this may be the start of a sinus infection  Will treat with augmentin last treatment was in January 2021 - fluticasone (FLONASE) 50 MCG/ACT nasal spray; Place 2 sprays into both nostrils daily.  Dispense: 16 g; Refill: 6 - amoxicillin-clavulanate (AUGMENTIN) 875-125 MG tablet; Take 1 tablet by mouth 2 (two) times daily for 7 days.  Dispense: 14 tablet; Refill: 0   COVID-19 Education: The signs and symptoms of COVID-19 were discussed with the patient and how to seek care for testing (follow up with PCP or arrange E-visit).  The importance of social distancing was discussed today.  Patient Risk:   After full review of this  patients clinical status, I feel that they are at least moderate risk at this time.  Time:   Today, I have spent 10.25 minutes/ seconds with the patient with telehealth technology discussing above diagnoses.     Medication Adjustments/Labs and Tests Ordered: Current medicines are reviewed at length with the patient today.  Concerns regarding medicines are outlined above.   Tests Ordered: No orders of the defined types were placed in this encounter.   Medication Changes: Meds ordered this encounter  Medications  . fluticasone (FLONASE) 50 MCG/ACT nasal spray    Sig: Place 2 sprays into both nostrils daily.    Dispense:  16 g    Refill:  6  . amoxicillin-clavulanate (AUGMENTIN) 875-125 MG tablet    Sig: Take 1 tablet by mouth 2 (two) times daily for 7 days.    Dispense:  14 tablet    Refill:  0    Disposition:  Follow up prn  Signed, [name], FNP

## 2020-02-07 ENCOUNTER — Other Ambulatory Visit: Payer: Self-pay | Admitting: Internal Medicine

## 2020-03-13 ENCOUNTER — Other Ambulatory Visit: Payer: Self-pay | Admitting: Nurse Practitioner

## 2020-03-29 LAB — HM DEXA SCAN

## 2020-03-29 LAB — HM MAMMOGRAPHY: HM Mammogram: NORMAL (ref 0–4)

## 2020-04-03 ENCOUNTER — Encounter: Payer: Self-pay | Admitting: Internal Medicine

## 2020-04-12 ENCOUNTER — Other Ambulatory Visit: Payer: Self-pay | Admitting: Nurse Practitioner

## 2020-05-10 ENCOUNTER — Other Ambulatory Visit: Payer: Self-pay | Admitting: Nurse Practitioner

## 2020-06-13 ENCOUNTER — Other Ambulatory Visit: Payer: Self-pay | Admitting: Nurse Practitioner

## 2020-06-13 ENCOUNTER — Telehealth: Payer: Self-pay

## 2020-06-13 NOTE — Telephone Encounter (Signed)
lvm for pt to call and schedule an appt °

## 2020-07-11 ENCOUNTER — Other Ambulatory Visit: Payer: Self-pay | Admitting: Nurse Practitioner

## 2020-07-11 DIAGNOSIS — J3089 Other allergic rhinitis: Secondary | ICD-10-CM

## 2020-07-31 ENCOUNTER — Ambulatory Visit (INDEPENDENT_AMBULATORY_CARE_PROVIDER_SITE_OTHER): Payer: Medicare Other | Admitting: Nurse Practitioner

## 2020-07-31 ENCOUNTER — Other Ambulatory Visit: Payer: Self-pay

## 2020-07-31 ENCOUNTER — Encounter: Payer: Self-pay | Admitting: Nurse Practitioner

## 2020-07-31 VITALS — BP 138/86 | HR 71 | Temp 98.0°F | Ht 65.0 in | Wt 226.0 lb

## 2020-07-31 DIAGNOSIS — S50862A Insect bite (nonvenomous) of left forearm, initial encounter: Secondary | ICD-10-CM

## 2020-07-31 DIAGNOSIS — I10 Essential (primary) hypertension: Secondary | ICD-10-CM

## 2020-07-31 DIAGNOSIS — W57XXXA Bitten or stung by nonvenomous insect and other nonvenomous arthropods, initial encounter: Secondary | ICD-10-CM | POA: Diagnosis not present

## 2020-07-31 DIAGNOSIS — R7309 Other abnormal glucose: Secondary | ICD-10-CM

## 2020-07-31 DIAGNOSIS — Z23 Encounter for immunization: Secondary | ICD-10-CM | POA: Diagnosis not present

## 2020-07-31 MED ORDER — TRIAMCINOLONE ACETONIDE 40 MG/ML IJ SUSP
40.0000 mg | Freq: Once | INTRAMUSCULAR | Status: AC
  Administered 2020-07-31: 40 mg via INTRAMUSCULAR

## 2020-07-31 NOTE — Progress Notes (Signed)
Rutherford Nail as a scribe for [name], FNP.,have documented all relevant documentation on the behalf of [name], FNP,as directed by  [name], FNP while in the presence of [name], Ryan.  This visit occurred during the SARS-CoV-2 public health emergency.  Safety protocols were in place, including screening questions prior to the visit, additional usage of staff PPE, and extensive cleaning of exam room while observing appropriate contact time as indicated for disinfecting solutions.  Subjective:     Patient ID: Candice Ayala , female    DOB: [birth year] , 64 y.o.   MRN: [id number]   Chief Complaint  Patient presents with  . Pruritis    HPI  Pt presents today for itching she has had for 2 days noticed Monday night, she has tried benadryl and it hasn't worked . She has several red and raised areas.  Before going to bed she had new areas to left arm.  She is scratching different areas of her body.  She has an area on her forehead that is raised.   Hypertension This is a chronic problem. The current episode started more than 1 year ago. The problem is unchanged. The problem is controlled (fairly controlled). Pertinent negatives include no anxiety or shortness of breath. Risk factors for coronary artery disease include obesity.     Past Medical History:  Diagnosis Date  . Allergy   . Arthritis   . GERD (gastroesophageal reflux disease)   . PONV (postoperative nausea and vomiting)      Family History  Problem Relation Age of Onset  . Cancer Mother        colon  . Early death Father   . Hypertension Brother   . Congestive Heart Failure Brother      Current Outpatient Medications:  .  Calcium Carbonate-Vitamin D (CALTRATE 600+D PO), Take by mouth., Disp: , Rfl:  .  cholecalciferol (VITAMIN D) 1000 UNITS tablet, Take 1,000 Units by mouth daily., Disp: , Rfl:  .  fluticasone (FLONASE) 50 MCG/ACT nasal spray, SPRAY 2 SPRAYS INTO EACH NOSTRIL EVERY DAY,  Disp: 48 mL, Rfl: 2 .  Multiple Vitamins-Minerals (MULTIVITAMIN WITH MINERALS) tablet, Take 1 tablet by mouth daily., Disp: , Rfl:  .  Multiple Vitamins-Minerals (ZINC PO), Take by mouth. 1 daily, Disp: , Rfl:  .  valsartan-hydrochlorothiazide (DIOVAN-HCT) 160-12.5 MG tablet, TAKE 1 TABLET BY MOUTH EVERY DAY, Disp: 30 tablet, Rfl: 1 .  chlorpheniramine (CHLOR-TRIMETON) 4 MG tablet, Take 1 tablet (4 mg total) by mouth 2 (two) times daily as needed for allergies. (Patient not taking: Reported on 07/31/2020), Disp: 14 tablet, Rfl: 0   Allergies  Allergen Reactions  . Contrast Media [Iodinated Diagnostic Agents] Hives     Review of Systems  Constitutional: Negative.   HENT: Negative.   Respiratory: Negative.  Negative for cough and shortness of breath.   Cardiovascular: Negative.   Genitourinary: Negative.   Musculoskeletal: Negative.   Skin: Positive for rash (several swollen and red lumps. ).  Neurological: Negative.   Psychiatric/Behavioral: Negative.      Today's Vitals   07/31/20 0906  BP: 138/86  Pulse: 71  Temp: 98 F (36.7 C)  TempSrc: Oral  Weight: 226 lb (102.5 kg)  Height: _0  (1.651 m)  PainSc: 0-No pain   Body mass index is 37.61 kg/m.   Objective:  Physical Exam Constitutional:      General: She is not in acute distress.    Appearance: Normal appearance.  Cardiovascular:  Rate and Rhythm: Normal rate and regular rhythm.     Pulses: Normal pulses.     Heart sounds: Normal heart sounds. No murmur heard.   Pulmonary:     Effort: Pulmonary effort is normal. No respiratory distress.     Breath sounds: Normal breath sounds.  Skin:    Findings: Erythema and rash present.  Neurological:     General: No focal deficit present.     Mental Status: She is alert and oriented to person, place, and time.     Cranial Nerves: No cranial nerve deficit.  Psychiatric:        Mood and Affect: Mood normal.        Behavior: Behavior normal.        Thought Content:  Thought content normal.        Judgment: Judgment normal.         Assessment And Plan:     1. Insect bite of left forearm, initial encounter  Multiple raised areas of erythema and swelling to left elbow and forearm.   Will treat with kenalog   She is to use anti itch - triamcinolone acetonide (KENALOG-40) injection 40 mg  2. Other abnormal glucose  Chronic, stable  Continue avoid sugary foods and drinks. - BMP8+eGFR - Hemoglobin A1c  3. Essential hypertension . B/P is controlled.  . CMP ordered to check renal function.  . The importance of regular exercise and dietary modification was stressed to the patient.  - BMP8+eGFR  4. Need for influenza vaccination  Influenza vaccine administered  Encouraged to take Tylenol as needed for fever or muscle aches. - Flu Vaccine QUAD High Dose(Fluad)     Patient was given opportunity to ask questions. Patient verbalized understanding of the plan and was able to repeat key elements of the plan. All questions were answered to their satisfaction.   Teola Bradley, FNP, have reviewed all documentation for this visit. The documentation on 07/31/20 for the exam, diagnosis, procedures, and orders are all accurate and complete.   THE PATIENT IS ENCOURAGED TO PRACTICE SOCIAL DISTANCING DUE TO THE COVID-19 PANDEMIC.

## 2020-07-31 NOTE — Patient Instructions (Signed)
Influenza Virus Vaccine (Flucelvax) What is this medicine? INFLUENZA VIRUS VACCINE (in floo EN zuh VAHY ruhs vak SEEN) helps to reduce the risk of getting influenza also known as the flu. The vaccine only helps protect you against some strains of the flu. This medicine may be used for other purposes; ask your health care provider or pharmacist if you have questions. COMMON BRAND NAME(S): FLUCELVAX What should I tell my health care provider before I take this medicine? They need to know if you have any of these conditions:  bleeding disorder like hemophilia  fever or infection  Guillain-Barre syndrome or other neurological problems  immune system problems  infection with the human immunodeficiency virus (HIV) or AIDS  low blood platelet counts  multiple sclerosis  an unusual or allergic reaction to influenza virus vaccine, other medicines, foods, dyes or preservatives  pregnant or trying to get pregnant  breast-feeding How should I use this medicine? This vaccine is for injection into a muscle. It is given by a health care professional. A copy of Vaccine Information Statements will be given before each vaccination. Read this sheet carefully each time. The sheet may change frequently. Talk to your pediatrician regarding the use of this medicine in children. Special care may be needed. Overdosage: If you think you've taken too much of this medicine contact a poison control center or emergency room at once. Overdosage: If you think you have taken too much of this medicine contact a poison control center or emergency room at once. NOTE: This medicine is only for you. Do not share this medicine with others. What if I miss a dose? This does not apply. What may interact with this medicine?  chemotherapy or radiation therapy  medicines that lower your immune system like etanercept, anakinra, infliximab, and adalimumab  medicines that treat or prevent blood clots like  warfarin  phenytoin  steroid medicines like prednisone or cortisone  theophylline  vaccines This list may not describe all possible interactions. Give your health care provider a list of all the medicines, herbs, non-prescription drugs, or dietary supplements you use. Also tell them if you smoke, drink alcohol, or use illegal drugs. Some items may interact with your medicine. What should I watch for while using this medicine? Report any side effects that do not go away within 3 days to your doctor or health care professional. Call your health care provider if any unusual symptoms occur within 6 weeks of receiving this vaccine. You may still catch the flu, but the illness is not usually as bad. You cannot get the flu from the vaccine. The vaccine will not protect against colds or other illnesses that may cause fever. The vaccine is needed every year. What side effects may I notice from receiving this medicine? Side effects that you should report to your doctor or health care professional as soon as possible:  allergic reactions like skin rash, itching or hives, swelling of the face, lips, or tongue Side effects that usually do not require medical attention (Report these to your doctor or health care professional if they continue or are bothersome.):  fever  headache  muscle aches and pains  pain, tenderness, redness, or swelling at the injection site  tiredness This list may not describe all possible side effects. Call your doctor for medical advice about side effects. You may report side effects to FDA at 1-800-FDA-1088. Where should I keep my medicine? The vaccine will be given by a health care professional in a clinic, pharmacy, doctor's   office, or other health care setting. You will not be given vaccine doses to store at home. NOTE: This sheet is a summary. It may not cover all possible information. If you have questions about this medicine, talk to your doctor, pharmacist, or  health care provider.  2020 Elsevier/Gold Standard (2011-10-21 14:06:47)  

## 2020-08-01 LAB — BMP8+EGFR
BUN/Creatinine Ratio: 11 — ABNORMAL LOW (ref 12–28)
BUN: 8 mg/dL (ref 8–27)
CO2: 22 mmol/L (ref 20–29)
Calcium: 9.9 mg/dL (ref 8.7–10.3)
Chloride: 103 mmol/L (ref 96–106)
Creatinine, Ser: 0.74 mg/dL (ref 0.57–1.00)
GFR calc Af Amer: 98 mL/min/{1.73_m2} (ref >59)
GFR calc non Af Amer: 85 mL/min/{1.73_m2} (ref >59)
Glucose: 94 mg/dL (ref 65–99)
Potassium: 4.1 mmol/L (ref 3.5–5.2)
Sodium: 141 mmol/L (ref 134–144)

## 2020-08-01 LAB — HEMOGLOBIN A1C
Est. average glucose Bld gHb Est-mCnc: 128 mg/dL
Hgb A1c MFr Bld: 6.1 % — ABNORMAL HIGH (ref 4.8–5.6)

## 2020-09-02 ENCOUNTER — Telehealth: Payer: Self-pay

## 2020-09-02 ENCOUNTER — Other Ambulatory Visit: Payer: Self-pay | Admitting: Nurse Practitioner

## 2020-09-02 NOTE — Telephone Encounter (Signed)
I called the pt to let her know that the office doesn't have [place] so she would need to contact [facility] or [facility] to scheduled for a booster shot.

## 2020-09-12 ENCOUNTER — Telehealth: Payer: Self-pay | Admitting: *Deleted

## 2020-09-12 NOTE — Telephone Encounter (Signed)
Called the patient and scheduled a new patient appt for 10/25 at 9 am with Dr Denman George. Gave the patient the address and phone number of the clinic. Also gave the policy for masks and visitors

## 2020-09-16 ENCOUNTER — Encounter: Payer: Self-pay | Admitting: Gynecologic Oncology

## 2020-09-16 ENCOUNTER — Other Ambulatory Visit: Payer: Self-pay

## 2020-09-16 ENCOUNTER — Telehealth: Payer: Self-pay | Admitting: *Deleted

## 2020-09-16 ENCOUNTER — Inpatient Hospital Stay: Payer: Medicare Other | Attending: Gynecologic Oncology

## 2020-09-16 ENCOUNTER — Inpatient Hospital Stay (HOSPITAL_BASED_OUTPATIENT_CLINIC_OR_DEPARTMENT_OTHER): Payer: Medicare Other | Admitting: Gynecologic Oncology

## 2020-09-16 VITALS — BP 158/87 | HR 66 | Temp 97.0°F | Resp 18 | Ht 66.0 in | Wt 206.8 lb

## 2020-09-16 DIAGNOSIS — R971 Elevated cancer antigen 125 [CA 125]: Secondary | ICD-10-CM | POA: Insufficient documentation

## 2020-09-16 DIAGNOSIS — M1990 Unspecified osteoarthritis, unspecified site: Secondary | ICD-10-CM | POA: Diagnosis not present

## 2020-09-16 DIAGNOSIS — D3912 Neoplasm of uncertain behavior of left ovary: Secondary | ICD-10-CM | POA: Insufficient documentation

## 2020-09-16 DIAGNOSIS — F32A Depression, unspecified: Secondary | ICD-10-CM | POA: Diagnosis not present

## 2020-09-16 DIAGNOSIS — E7800 Pure hypercholesterolemia, unspecified: Secondary | ICD-10-CM | POA: Insufficient documentation

## 2020-09-16 DIAGNOSIS — M810 Age-related osteoporosis without current pathological fracture: Secondary | ICD-10-CM | POA: Diagnosis not present

## 2020-09-16 DIAGNOSIS — Z91041 Radiographic dye allergy status: Secondary | ICD-10-CM

## 2020-09-16 DIAGNOSIS — E669 Obesity, unspecified: Secondary | ICD-10-CM | POA: Diagnosis not present

## 2020-09-16 DIAGNOSIS — N950 Postmenopausal bleeding: Secondary | ICD-10-CM | POA: Diagnosis not present

## 2020-09-16 DIAGNOSIS — K219 Gastro-esophageal reflux disease without esophagitis: Secondary | ICD-10-CM | POA: Diagnosis not present

## 2020-09-16 DIAGNOSIS — Z79899 Other long term (current) drug therapy: Secondary | ICD-10-CM | POA: Diagnosis not present

## 2020-09-16 DIAGNOSIS — I10 Essential (primary) hypertension: Secondary | ICD-10-CM | POA: Diagnosis not present

## 2020-09-16 DIAGNOSIS — F419 Anxiety disorder, unspecified: Secondary | ICD-10-CM | POA: Insufficient documentation

## 2020-09-16 DIAGNOSIS — R1900 Intra-abdominal and pelvic swelling, mass and lump, unspecified site: Secondary | ICD-10-CM

## 2020-09-16 LAB — BASIC METABOLIC PANEL
Anion gap: 6 (ref 5–15)
BUN: 9 mg/dL (ref 8–23)
CO2: 29 mmol/L (ref 22–32)
Calcium: 9.9 mg/dL (ref 8.9–10.3)
Chloride: 103 mmol/L (ref 98–111)
Creatinine, Ser: 0.78 mg/dL (ref 0.44–1.00)
GFR, Estimated: >60 mL/min (ref >60)
Glucose, Bld: 101 mg/dL — ABNORMAL HIGH (ref 70–99)
Potassium: 4 mmol/L (ref 3.5–5.1)
Sodium: 138 mmol/L (ref 135–145)

## 2020-09-16 MED ORDER — PREDNISONE 50 MG PO TABS: ORAL_TABLET | ORAL | 0 refills | Status: DC

## 2020-09-16 MED ORDER — DIPHENHYDRAMINE HCL 50 MG PO TABS: ORAL_TABLET | ORAL | 0 refills | Status: DC

## 2020-09-16 NOTE — Progress Notes (Signed)
Consult Note: Gyn-Onc  Consult was requested by Laurin Coder, NP for the evaluation of Candice Ayala 64 y.o. female  CC:  Chief Complaint  Patient presents with  . Pelvic mass    Assessment/Plan:  [name]  is a 64 y.o.  year old with a left ovarian mass, and postmenopausal bleeding.  She has an elevated CA-125 of 96.5.  I explained to the patient that the underlying disease processes as yet undefined.  I explained there is a possibility she has malignancy of either the uterus, ovary, or both.  Alternatively she may have benign etiologies to her symptoms.  We will follow up on today's endometrial biopsy to rule out endometrial cancer.  We have scheduled her for CT abdomen and pelvis given her elevated tumor markers to ensure there is not carcinomatosis as part of her presentation, and if this was the case we would facilitate biopsy of this to establish diagnosis and proceed with therapy.  Provided her CT scan is unremarkable for extraovarian disease, and the plan is to proceed with robotic assisted total hysterectomy and LSO with staging procedures if indicated based on pathology.  I explained that this is typically an outpatient procedure if there is no complication and she is meeting discharge criteria in the PACU.  I counseled her extensively regarding our goals of treatment including surgery.  I explained the uncertainty in her diagnosis given the lack of histologic confirmation.  She will need to take prednisone taper prior to CT imaging due to her contrast allergy.  Surgery has been scheduled for early November.  She was recommended to take 2 to 4 weeks off of work.   HPI: [name] is a 64 year old P0 who was seen in consultation at the request Marciano Sequin NP for evaluation of a left-sided ovarian mass and postmenopausal bleeding.  The patient reported noting postmenopausal bleeding in September 2021.  She has a history of receiving regular gynecologic  care, and had had an examination within the prior year, however scheduled a symptom driven appointment with Laurin Coder, her provider, given this new symptom.  This evaluation was performed on September 09, 2020.  At that time a transvaginal ultrasound scan was performed which revealed multiple calcified fibroids noted with a uterus measuring 8 x 6 x 4.8 cm.  The endometrium measured 3.6 mm.  The left ovary contained a hypoechoic mass with posterior shadowing measuring 4.5 x 4.2 x 4 cm with peripheral blood flow noted.  It was consistent with possibly an endometrioma versus dermoid.  Ca1 25 was drawn on September 06, 2020 and was elevated at 96.5.  Patient had a history of a Pap smear performed in February 2020 which was normal with negative high-risk HPV testing.  Her medical history is most significant for obesity with a BMI of 33 kg meters squared, depression and anxiety, osteoporosis, hypercholesterolemia..  Her surgical history is most significant for expiratory laparotomy in her 65s for a right ovarian tumor.  She believes that the entire right tube and ovary removed.  She denies a history of ovarian cancer.  Her gynecologic history is remarkable for prior right salpingo-oophorectomy.  She denied history of abnormal Pap test.  She had never been pregnant.  Her family cancer history is unremarkable.  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 She lives alone.   Current Meds:  Outpatient Encounter Medications as of 09/16/2020  Medication Sig  . Calcium  Carbonate-Vitamin D (CALTRATE 600+D PO) Take by mouth.  . chlorpheniramine (CHLOR-TRIMETON) 4 MG tablet Take 1 tablet (4 mg total) by mouth 2 (two) times daily as needed for allergies.  . cholecalciferol (VITAMIN D) 1000 UNITS tablet Take 1,000 Units by mouth daily.  . fluticasone (FLONASE) 50 MCG/ACT nasal spray SPRAY 2 SPRAYS INTO EACH NOSTRIL EVERY DAY  . Multiple Vitamins-Minerals  (MULTIVITAMIN WITH MINERALS) tablet Take 1 tablet by mouth daily.  . Multiple Vitamins-Minerals (ZINC PO) Take by mouth. 1 daily  . valsartan-hydrochlorothiazide (DIOVAN-HCT) 160-12.5 MG tablet TAKE 1 TABLET BY MOUTH EVERY DAY  . [START ON 09/23/2020] diphenhydrAMINE (BENADRYL) 50 MG tablet 50 mg of benadryl orally administered 1 hour prior to your CT scan. Take at 7 am on 09/23/20.  Marland Kitchen predniSONE (DELTASONE) 50 MG tablet Take three doses of 50 mg oral prednisone administered 13 (7pm on 10/31), 7 (1am in the morning of Nov 1) , and 1 hour (7am on Nov 1) prior to your CT scan   No facility-administered encounter medications on file as of 09/16/2020.    Allergy:  Allergies  Allergen Reactions  . Contrast Media [Iodinated Diagnostic Agents] Hives    Social Hx:   Social History   Socioeconomic History  . Marital status: Divorced    Spouse name: Not on file  . Number of children: Not on file  . Years of education: Not on file  . Highest education level: Not on file  Occupational History  . Not on file  Tobacco Use  . Smoking status: Never Smoker  . Smokeless tobacco: Never Used  . Tobacco comment: She is a non-smoker  Vaping Use  . Vaping Use: Never used  Substance and Sexual Activity  . Alcohol use: No  . Drug use: No  . Sexual activity: Not Currently  Other Topics Concern  . Not on file  Social History Narrative  . Not on file   Social Determinants of Health   Financial Resource Strain:   . Difficulty of Paying Living Expenses: Not on file  Food Insecurity:   . Worried About Charity fundraiser in the Last Year: Not on file  . Ran Out of Food in the Last Year: Not on file  Transportation Needs:   . Lack of Transportation (Medical): Not on file  . Lack of Transportation (Non-Medical): Not on file  Physical Activity:   . Days of Exercise per Week: Not on file  . Minutes of Exercise per Session: Not on file  Stress:   . Feeling of Stress : Not on file  Social  Connections:   . Frequency of Communication with Friends and Family: Not on file  . Frequency of Social Gatherings with Friends and Family: Not on file  . Attends Religious Services: Not on file  . Active Member of Clubs or Organizations: Not on file  . Attends Archivist Meetings: Not on file  . Marital Status: Not on file  Intimate Partner Violence:   . Fear of Current or Ex-Partner: Not on file  . Emotionally Abused: Not on file  . Physically Abused: Not on file  . Sexually Abused: Not on file    Past Surgical Hx:  Past Surgical History:  Procedure Laterality Date  . BREAST BIOPSY Right 11/27/2013   Procedure: RIGHT BREAST BIOPSY WITH NEEDLE LOCALIZATION;  Surgeon: Odis Hollingshead, MD;  Location: [facility];  Service: General;  Laterality: Right;  . COLONOSCOPY    . JOINT REPLACEMENT  2004  right total hip  . OVARIAN CYST REMOVAL  1980  . TOTAL HIP ARTHROPLASTY Right 2004    Past Medical Hx:  Past Medical History:  Diagnosis Date  . Allergy   . Arthritis   . GERD (gastroesophageal reflux disease)   . Hypertension   . PONV (postoperative nausea and vomiting)     Past Gynecological History:  See HPI No LMP recorded. Patient is postmenopausal.  Family Hx:  Family History  Problem Relation Age of Onset  . Cancer Mother        colon  . Early death Father   . Hypertension Brother   . Congestive Heart Failure Brother     Review of Systems:  Constitutional  Feels well,   ENT Normal appearing ears and nares bilaterally Skin/Breast  No rash, sores, jaundice, itching, dryness Cardiovascular  No chest pain, shortness of breath, or edema  Pulmonary  No cough or wheeze.  Gastro Intestinal  No nausea, vomitting, or diarrhoea. No bright red blood per rectum, no abdominal pain, change in bowel movement, or constipation.  Genito Urinary  No frequency, urgency, dysuria, + post men bleeding Musculo Skeletal  No myalgia, arthralgia, joint  swelling or pain  Neurologic  No weakness, numbness, change in gait,  Psychology  +  anxiety  Vitals:  Blood pressure (!) 158/87, pulse 66, temperature (!) 97 F (36.1 C), temperature source Tympanic, resp. rate 18, height 5\' 6"  (1.676 m), weight 206 lb 12.7 oz (93.8 kg), SpO2 100 %.  Physical Exam: WD in NAD Neck  Supple NROM, without any enlargements.  Lymph Node Survey No cervical supraclavicular or inguinal adenopathy Cardiovascular  Pulse normal rate, regularity and rhythm. S1 and S2 normal.  Lungs  Clear to auscultation bilateraly, without wheezes/crackles/rhonchi. Good air movement.  Skin  No rash/lesions/breakdown  Psychiatry  Alert and oriented to person, place, and time  Abdomen  Normoactive bowel sounds, abdomen soft, non-tender and obese without evidence of hernia.  Vertical midline incision Back No CVA tenderness Genito Urinary  Vulva/vagina: Normal external female genitalia.  No lesions. No discharge or bleeding.  Bladder/urethra:  No lesions or masses, well supported bladder  Vagina: Grossly normal  Cervix: Normal appearing, no lesions.  Uterus:  Small, mobile, no parametrial involvement or nodularity.  Adnexa: No palpable masses.  Narrow pubic Rectal  Good tone, no masses no cul de sac nodularity.  Extremities  No bilateral cyanosis, clubbing or edema.  Procedure Note:  Preop Dx: Postmenopausal bleeding Postop Dx: Same Procedure: Endometrial biopsy Surgeon: Dorann Ou, MD EBL: Scant Specimens: Endometrium Complications: None Procedure Details: The patient provided verbal consent and a verbal timeout was performed.  Speculum was inserted and the cervix was visualized.  The single-tooth tenaculum grasped the cervix anteriorly.  The [place] was passed to the uterine fundus to a depth of 8 cm a total of 3 different times.  Aspiration on each pass took place from the endometrial cavity and this was placed in a specimen cup.  Scant amount of tissue was yielded.   The tenaculum was removed and hemostasis was achieved at the tenaculum sites with silver nitrate.  The patient tolerated the procedure well.  The specimen was sent for histopathology.  60 minutes of total time was spent for this patient encounter, including preparation, face-to-face counseling with the patient and coordination of care, review of imaging (results and images), communication with the referring provider and documentation of the encounter.   Thereasa Solo, MD  09/16/2020, 6:59 PM

## 2020-09-16 NOTE — Telephone Encounter (Signed)
Told Candice Ayala that her kidney function and electrolytes were fine today per Joylene John, NP. Pt verbalized understanding.

## 2020-09-16 NOTE — Telephone Encounter (Signed)
Returned the patient's call and told her per [facility] APP that she can pick up the medication scripts today.

## 2020-09-16 NOTE — Patient Instructions (Signed)
Plan to have a CT scan of the abdomen and pelvis prior to surgery. You will need to take three doses of 50 mg oral prednisone administered 13 (7pm on 10/31), 7 (1am in the morning of Nov 1) , and 1 hour (7am on Nov 1) prior to your CT scan and then 50 mg of benadryl orally administered 1 hour prior to your CT scan (7 am on 11/1).  You have an ovarian mass. Dr. Denman George does not know if it is cancer and this will be determined at the time of surgery. You also have bleeding from your uterus which is separate from the mass. She took a biopsy of the lining of the uterus today to determine if there is cancer inside the uterus. We will contact you with the results.  Preparing for your Surgery  Plan for surgery on October 01, 2020 with Dr. Everitt Amber at [facility] will be scheduled for a robotic assisted total laparoscopic hysterectomy (removal of the uterus and cervix), unilateral salpingo-oophorectomy (removal of one ovary and fallopian tube), lysis of adhesions, possible staging if cancer is identified.   Pre-operative Testing -You will receive a phone call from presurgical testing at [facility] to arrange for a pre-operative appointment over the phone, lab appointment, and COVID test. The COVID test normally happens 3 days prior to the surgery and they ask that you self quarantine after the test up until surgery to decrease chance of exposure.  -Bring your insurance card, copy of an advanced directive if applicable, medication list  -At that visit, you will be asked to sign a consent for a possible blood transfusion in case a transfusion becomes necessary during surgery.  The need for a blood transfusion is rare but having consent is a necessary part of your care.     -You should not be taking blood thinners or aspirin at least ten days prior to surgery unless instructed by your surgeon.  -Do not take supplements such as fish oil (omega 3), red yeast rice, turmeric before your  surgery.   Day Before Surgery at [facility] will be asked to take in a light diet the day before surgery. You will be advised you can have clear liquids up until 3 hours before your surgery.    Eat a light diet the day before surgery.  Examples including soups, broths, toast, yogurt, mashed potatoes.  AVOID GAS PRODUCING FOODS. Things to avoid include carbonated beverages (fizzy beverages), raw fruits and raw vegetables, or beans.   If your bowels are filled with gas, your surgeon will have difficulty visualizing your pelvic organs which increases your surgical risks.  Your role in recovery Your role is to become active as soon as directed by your doctor, while still giving yourself time to heal.  Rest when you feel tired. You will be asked to do the following in order to speed your recovery:  - Cough and breathe deeply. This helps to clear and expand your lungs and can prevent pneumonia after surgery.  - [place]. Do mild physical activity. Walking or moving your legs help your circulation and body functions return to normal. Do not try to get up or walk alone the first time after surgery.   -If you develop swelling on one leg or the other, pain in the back of your leg, redness/warmth in one of your legs, please call the office or go to the Emergency Room to have a doppler to rule  out a blood clot. For shortness of breath, chest pain-seek care in the Emergency Room as soon as possible. - Actively manage your pain. Managing your pain lets you move in comfort. We will ask you to rate your pain on a scale of zero to 10. It is your responsibility to tell your doctor or nurse where and how much you hurt so your pain can be treated.  Special Considerations -If you are diabetic, you may be placed on insulin after surgery to have closer control over your blood sugars to promote healing and recovery.  This does not mean that you will be discharged on insulin.  If applicable, your oral  antidiabetics will be resumed when you are tolerating a solid diet.  -Your final pathology results from surgery should be available around one week after surgery and the results will be relayed to you when available.  -Dr. Lahoma Crocker is the surgeon that assists your GYN Oncologist with surgery.  If you end up staying the night, the next day after your surgery you will either see Dr. Denman George, Dr. Berline Lopes, or Dr. Lahoma Crocker.  -FMLA forms can be faxed to [phone] and please allow 5-7 business days for completion.  Pain Management After Surgery -You will be prescribed your pain medication and bowel regimen medications before surgery so that you can have these available when you are discharged from the hospital. The pain medication is for use ONLY AFTER surgery and a new prescription will not be given.   -Make sure that you have Tylenol and Ibuprofen at home to use on a regular basis after surgery for pain control. We recommend alternating the medications every hour to six hours since they work differently and are processed in the body differently for pain relief.  -Review the attached handout on narcotic use and their risks and side effects.   Bowel Regimen -You will be prescribed Sennakot-S to take nightly to prevent constipation especially if you are taking the narcotic pain medication intermittently.  It is important to prevent constipation and drink adequate amounts of liquids. You can stop taking this medication when you are not taking pain medication and you are back on your normal bowel routine.  Risks of Surgery Risks of surgery are low but include bleeding, infection, damage to surrounding structures, re-operation, blood clots, and very rarely death.   Blood Transfusion Information (For the consent to be signed before surgery)  We will be checking your blood type before surgery so in case of emergencies, we will know what type of blood you would need.                                             WHAT IS A BLOOD TRANSFUSION?  A transfusion is the replacement of blood or some of its parts. Blood is made up of multiple cells which provide different functions.  Red blood cells carry oxygen and are used for blood loss replacement.  White blood cells fight against infection.  Platelets control bleeding.  Plasma helps clot blood.  Other blood products are available for specialized needs, such as hemophilia or other clotting disorders. BEFORE THE TRANSFUSION  Who gives blood for transfusions?   You may be able to donate blood to be used at a later date on yourself (autologous donation).  Relatives can be asked to donate blood. This is generally not any safer  than if you have received blood from a stranger. The same precautions are taken to ensure safety when a relative's blood is donated.  Healthy volunteers who are fully evaluated to make sure their blood is safe. This is blood bank blood. Transfusion therapy is the safest it has ever been in the practice of medicine. Before blood is taken from a donor, a complete history is taken to make sure that person has no history of diseases nor engages in risky social behavior (examples are intravenous drug use or sexual activity with multiple partners). The donor's travel history is screened to minimize risk of transmitting infections, such as malaria. The donated blood is tested for signs of infectious diseases, such as HIV and hepatitis. The blood is then tested to be sure it is compatible with you in order to minimize the chance of a transfusion reaction. If you or a relative donates blood, this is often done in anticipation of surgery and is not appropriate for emergency situations. It takes many days to process the donated blood. RISKS AND COMPLICATIONS Although transfusion therapy is very safe and saves many lives, the main dangers of transfusion include:   Getting an infectious disease.  Developing a transfusion  reaction. This is an allergic reaction to something in the blood you were given. Every precaution is taken to prevent this. The decision to have a blood transfusion has been considered carefully by your caregiver before blood is given. Blood is not given unless the benefits outweigh the risks.  AFTER SURGERY INSTRUCTIONS  Return to work: 3-4 weeks if applicable  Activity: 1. Be up and out of the bed during the day.  Take a nap if needed.  You may walk up steps but be careful and use the hand rail.  Stair climbing will tire you more than you think, you may need to stop part way and rest.   2. No lifting or straining for 6 weeks over 10 pounds. No pushing, pulling, straining for 6 weeks.  3. No driving for 1 week(s).  Do not drive if you are taking narcotic pain medicine and make sure that your reaction time has returned.   4. You can shower as soon as the next day after surgery. Shower daily.  Use your regular soap and water (not directly on the incision) and pat your incision(s) dry afterwards; don't rub.  No tub baths or submerging your body in water until cleared by your surgeon. If you have the soap that was given to you by pre-surgical testing that was used before surgery, you do not need to use it afterwards because this can irritate your incisions.   5. No sexual activity and nothing in the vagina for 8 weeks.  6. You may experience a small amount of clear drainage from your incisions, which is normal.  If the drainage persists, increases, or changes color please call the office.  7. Do not use creams, lotions, or ointments such as neosporin on your incisions after surgery until advised by your surgeon because they can cause removal of the dermabond glue on your incisions.    8. You may experience vaginal spotting after surgery or around the 6-8 week mark from surgery when the stitches at the top of the vagina begin to dissolve.  The spotting is normal but if you experience heavy  bleeding, call our office.  9. Take Tylenol or ibuprofen first for pain and only use narcotic pain medication for severe pain not relieved by the Tylenol or  Ibuprofen.  Monitor your Tylenol intake to a max of 4,000 mg in a 24 hour period. You can alternate these medications after surgery.  Diet: 1. Low sodium Heart Healthy Diet is recommended but you are cleared to resume your normal (before surgery) diet after your procedure.  2. It is safe to use a laxative, such as Miralax or Colace, if you have difficulty moving your bowels. You have been prescribed Sennakot at bedtime every evening to keep bowel movements regular and to prevent constipation.    Wound Care: 1. Keep clean and dry.  Shower daily.  Reasons to call the Doctor:  Fever - Oral temperature greater than 100.4 degrees Fahrenheit  Foul-smelling vaginal discharge  Difficulty urinating  Nausea and vomiting  Increased pain at the site of the incision that is unrelieved with pain medicine.  Difficulty breathing with or without chest pain  New calf pain especially if only on one side  Sudden, continuing increased vaginal bleeding with or without clots.   Contacts: For questions or concerns you should contact:  Dr. Everitt Amber at [phone]  Joylene John, NP at [phone]  After Hours: call [phone] and have the GYN Oncologist paged/contacted (after 5 pm or on the weekends)

## 2020-09-16 NOTE — H&P (View-Only) (Signed)
Consult Note: Gyn-Onc  Consult was requested by Laurin Coder, NP for the evaluation of Candice Ayala 64 y.o. female  CC:  Chief Complaint  Patient presents with  . Pelvic mass    Assessment/Plan:  [name]  is a 64 y.o.  year old with a left ovarian mass, and postmenopausal bleeding.  She has an elevated CA-125 of 96.5.  I explained to the patient that the underlying disease processes as yet undefined.  I explained there is a possibility she has malignancy of either the uterus, ovary, or both.  Alternatively she may have benign etiologies to her symptoms.  We will follow up on today's endometrial biopsy to rule out endometrial cancer.  We have scheduled her for CT abdomen and pelvis given her elevated tumor markers to ensure there is not carcinomatosis as part of her presentation, and if this was the case we would facilitate biopsy of this to establish diagnosis and proceed with therapy.  Provided her CT scan is unremarkable for extraovarian disease, and the plan is to proceed with robotic assisted total hysterectomy and LSO with staging procedures if indicated based on pathology.  I explained that this is typically an outpatient procedure if there is no complication and she is meeting discharge criteria in the PACU.  I counseled her extensively regarding our goals of treatment including surgery.  I explained the uncertainty in her diagnosis given the lack of histologic confirmation.  She will need to take prednisone taper prior to CT imaging due to her contrast allergy.  Surgery has been scheduled for early November.  She was recommended to take 2 to 4 weeks off of work.   HPI: [name] is a 64 year old P0 who was seen in consultation at the request Marciano Sequin NP for evaluation of a left-sided ovarian mass and postmenopausal bleeding.  The patient reported noting postmenopausal bleeding in September 2021.  She has a history of receiving regular gynecologic  care, and had had an examination within the prior year, however scheduled a symptom driven appointment with Laurin Coder, her provider, given this new symptom.  This evaluation was performed on September 09, 2020.  At that time a transvaginal ultrasound scan was performed which revealed multiple calcified fibroids noted with a uterus measuring 8 x 6 x 4.8 cm.  The endometrium measured 3.6 mm.  The left ovary contained a hypoechoic mass with posterior shadowing measuring 4.5 x 4.2 x 4 cm with peripheral blood flow noted.  It was consistent with possibly an endometrioma versus dermoid.  Ca1 25 was drawn on September 06, 2020 and was elevated at 96.5.  Patient had a history of a Pap smear performed in February 2020 which was normal with negative high-risk HPV testing.  Her medical history is most significant for obesity with a BMI of 33 kg meters squared, depression and anxiety, osteoporosis, hypercholesterolemia..  Her surgical history is most significant for expiratory laparotomy in her 38s for a right ovarian tumor.  She believes that the entire right tube and ovary removed.  She denies a history of ovarian cancer.  Her gynecologic history is remarkable for prior right salpingo-oophorectomy.  She denied history of abnormal Pap test.  She had never been pregnant.  Her family cancer history is unremarkable.  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 She lives alone.   Current Meds:  Outpatient Encounter Medications as of 09/16/2020  Medication Sig  . Calcium  Carbonate-Vitamin D (CALTRATE 600+D PO) Take by mouth.  . chlorpheniramine (CHLOR-TRIMETON) 4 MG tablet Take 1 tablet (4 mg total) by mouth 2 (two) times daily as needed for allergies.  . cholecalciferol (VITAMIN D) 1000 UNITS tablet Take 1,000 Units by mouth daily.  . fluticasone (FLONASE) 50 MCG/ACT nasal spray SPRAY 2 SPRAYS INTO EACH NOSTRIL EVERY DAY  . Multiple Vitamins-Minerals  (MULTIVITAMIN WITH MINERALS) tablet Take 1 tablet by mouth daily.  . Multiple Vitamins-Minerals (ZINC PO) Take by mouth. 1 daily  . valsartan-hydrochlorothiazide (DIOVAN-HCT) 160-12.5 MG tablet TAKE 1 TABLET BY MOUTH EVERY DAY  . [START ON 09/23/2020] diphenhydrAMINE (BENADRYL) 50 MG tablet 50 mg of benadryl orally administered 1 hour prior to your CT scan. Take at 7 am on 09/23/20.  Marland Kitchen predniSONE (DELTASONE) 50 MG tablet Take three doses of 50 mg oral prednisone administered 13 (7pm on 10/31), 7 (1am in the morning of Nov 1) , and 1 hour (7am on Nov 1) prior to your CT scan   No facility-administered encounter medications on file as of 09/16/2020.    Allergy:  Allergies  Allergen Reactions  . Contrast Media [Iodinated Diagnostic Agents] Hives    Social Hx:   Social History   Socioeconomic History  . Marital status: Divorced    Spouse name: Not on file  . Number of children: Not on file  . Years of education: Not on file  . Highest education level: Not on file  Occupational History  . Not on file  Tobacco Use  . Smoking status: Never Smoker  . Smokeless tobacco: Never Used  . Tobacco comment: She is a non-smoker  Vaping Use  . Vaping Use: Never used  Substance and Sexual Activity  . Alcohol use: No  . Drug use: No  . Sexual activity: Not Currently  Other Topics Concern  . Not on file  Social History Narrative  . Not on file   Social Determinants of Health   Financial Resource Strain:   . Difficulty of Paying Living Expenses: Not on file  Food Insecurity:   . Worried About Charity fundraiser in the Last Year: Not on file  . Ran Out of Food in the Last Year: Not on file  Transportation Needs:   . Lack of Transportation (Medical): Not on file  . Lack of Transportation (Non-Medical): Not on file  Physical Activity:   . Days of Exercise per Week: Not on file  . Minutes of Exercise per Session: Not on file  Stress:   . Feeling of Stress : Not on file  Social  Connections:   . Frequency of Communication with Friends and Family: Not on file  . Frequency of Social Gatherings with Friends and Family: Not on file  . Attends Religious Services: Not on file  . Active Member of Clubs or Organizations: Not on file  . Attends Archivist Meetings: Not on file  . Marital Status: Not on file  Intimate Partner Violence:   . Fear of Current or Ex-Partner: Not on file  . Emotionally Abused: Not on file  . Physically Abused: Not on file  . Sexually Abused: Not on file    Past Surgical Hx:  Past Surgical History:  Procedure Laterality Date  . BREAST BIOPSY Right 11/27/2013   Procedure: RIGHT BREAST BIOPSY WITH NEEDLE LOCALIZATION;  Surgeon: Odis Hollingshead, MD;  Location: [facility];  Service: General;  Laterality: Right;  . COLONOSCOPY    . JOINT REPLACEMENT  2004  right total hip  . OVARIAN CYST REMOVAL  1980  . TOTAL HIP ARTHROPLASTY Right 2004    Past Medical Hx:  Past Medical History:  Diagnosis Date  . Allergy   . Arthritis   . GERD (gastroesophageal reflux disease)   . Hypertension   . PONV (postoperative nausea and vomiting)     Past Gynecological History:  See HPI No LMP recorded. Patient is postmenopausal.  Family Hx:  Family History  Problem Relation Age of Onset  . Cancer Mother        colon  . Early death Father   . Hypertension Brother   . Congestive Heart Failure Brother     Review of Systems:  Constitutional  Feels well,   ENT Normal appearing ears and nares bilaterally Skin/Breast  No rash, sores, jaundice, itching, dryness Cardiovascular  No chest pain, shortness of breath, or edema  Pulmonary  No cough or wheeze.  Gastro Intestinal  No nausea, vomitting, or diarrhoea. No bright red blood per rectum, no abdominal pain, change in bowel movement, or constipation.  Genito Urinary  No frequency, urgency, dysuria, + post men bleeding Musculo Skeletal  No myalgia, arthralgia, joint  swelling or pain  Neurologic  No weakness, numbness, change in gait,  Psychology  +  anxiety  Vitals:  Blood pressure (!) 158/87, pulse 66, temperature (!) 97 F (36.1 C), temperature source Tympanic, resp. rate 18, height 5\' 6"  (1.676 m), weight 206 lb 12.7 oz (93.8 kg), SpO2 100 %.  Physical Exam: WD in NAD Neck  Supple NROM, without any enlargements.  Lymph Node Survey No cervical supraclavicular or inguinal adenopathy Cardiovascular  Pulse normal rate, regularity and rhythm. S1 and S2 normal.  Lungs  Clear to auscultation bilateraly, without wheezes/crackles/rhonchi. Good air movement.  Skin  No rash/lesions/breakdown  Psychiatry  Alert and oriented to person, place, and time  Abdomen  Normoactive bowel sounds, abdomen soft, non-tender and obese without evidence of hernia.  Vertical midline incision Back No CVA tenderness Genito Urinary  Vulva/vagina: Normal external female genitalia.  No lesions. No discharge or bleeding.  Bladder/urethra:  No lesions or masses, well supported bladder  Vagina: Grossly normal  Cervix: Normal appearing, no lesions.  Uterus:  Small, mobile, no parametrial involvement or nodularity.  Adnexa: No palpable masses.  Narrow pubic Rectal  Good tone, no masses no cul de sac nodularity.  Extremities  No bilateral cyanosis, clubbing or edema.  Procedure Note:  Preop Dx: Postmenopausal bleeding Postop Dx: Same Procedure: Endometrial biopsy Surgeon: Dorann Ou, MD EBL: Scant Specimens: Endometrium Complications: None Procedure Details: The patient provided verbal consent and a verbal timeout was performed.  Speculum was inserted and the cervix was visualized.  The single-tooth tenaculum grasped the cervix anteriorly.  The Pipelle was passed to the uterine fundus to a depth of 8 cm a total of 3 different times.  Aspiration on each pass took place from the endometrial cavity and this was placed in a specimen cup.  Scant amount of tissue was yielded.   The tenaculum was removed and hemostasis was achieved at the tenaculum sites with silver nitrate.  The patient tolerated the procedure well.  The specimen was sent for histopathology.  60 minutes of total time was spent for this patient encounter, including preparation, face-to-face counseling with the patient and coordination of care, review of imaging (results and images), communication with the referring provider and documentation of the encounter.   Thereasa Solo, MD  09/16/2020, 6:59 PM

## 2020-09-18 ENCOUNTER — Telehealth: Payer: Self-pay | Admitting: *Deleted

## 2020-09-18 ENCOUNTER — Other Ambulatory Visit: Payer: Self-pay | Admitting: Gynecologic Oncology

## 2020-09-18 ENCOUNTER — Telehealth: Payer: Self-pay

## 2020-09-18 DIAGNOSIS — R1900 Intra-abdominal and pelvic swelling, mass and lump, unspecified site: Secondary | ICD-10-CM

## 2020-09-18 LAB — SURGICAL PATHOLOGY

## 2020-09-18 NOTE — Telephone Encounter (Signed)
Called and scheduled the patient for a pre-op appt with Melissa APP in 11/1 at 9 am

## 2020-09-18 NOTE — Telephone Encounter (Signed)
Told Candice Ayala that the endometrial biopsy showed no pre-cancer or cancer per Melissa Cross,NP. Pt verbalized understanding.

## 2020-09-20 ENCOUNTER — Encounter (HOSPITAL_COMMUNITY)
Admission: RE | Admit: 2020-09-20 | Discharge: 2020-09-20 | Disposition: A | Discharge status: Home / Self Care | Payer: Medicare Other | Source: Ambulatory Visit | Attending: Gynecologic Oncology | Admitting: Gynecologic Oncology

## 2020-09-20 ENCOUNTER — Other Ambulatory Visit: Payer: Self-pay

## 2020-09-20 ENCOUNTER — Encounter (HOSPITAL_COMMUNITY): Payer: Self-pay

## 2020-09-20 ENCOUNTER — Telehealth: Payer: Self-pay

## 2020-09-20 DIAGNOSIS — Z01812 Encounter for preprocedural laboratory examination: Secondary | ICD-10-CM | POA: Diagnosis present

## 2020-09-20 LAB — CBC
HCT: 38.8 % (ref 36.0–46.0)
Hemoglobin: 12.6 g/dL (ref 12.0–15.0)
MCH: 29.4 pg (ref 26.0–34.0)
MCHC: 32.5 g/dL (ref 30.0–36.0)
MCV: 90.7 fL (ref 80.0–100.0)
Platelets: 354 10*3/uL (ref 150–400)
RBC: 4.28 MIL/uL (ref 3.87–5.11)
RDW: 14.3 % (ref 11.5–15.5)
WBC: 6.3 10*3/uL (ref 4.0–10.5)
nRBC: 0 % (ref 0.0–0.2)

## 2020-09-20 LAB — URINALYSIS, ROUTINE W REFLEX MICROSCOPIC
Bilirubin Urine: NEGATIVE
Glucose, UA: NEGATIVE mg/dL
Ketones, ur: NEGATIVE mg/dL
Nitrite: NEGATIVE
Protein, ur: NEGATIVE mg/dL
Specific Gravity, Urine: 1.016 (ref 1.005–1.030)
pH: 7 (ref 5.0–8.0)

## 2020-09-20 LAB — COMPREHENSIVE METABOLIC PANEL
ALT: 17 U/L (ref 0–44)
AST: 19 U/L (ref 15–41)
Albumin: 3.7 g/dL (ref 3.5–5.0)
Alkaline Phosphatase: 59 U/L (ref 38–126)
Anion gap: 9 (ref 5–15)
BUN: 13 mg/dL (ref 8–23)
CO2: 27 mmol/L (ref 22–32)
Calcium: 9.3 mg/dL (ref 8.9–10.3)
Chloride: 102 mmol/L (ref 98–111)
Creatinine, Ser: 0.8 mg/dL (ref 0.44–1.00)
GFR, Estimated: >60 mL/min (ref >60)
Glucose, Bld: 102 mg/dL — ABNORMAL HIGH (ref 70–99)
Potassium: 4.6 mmol/L (ref 3.5–5.1)
Sodium: 138 mmol/L (ref 135–145)
Total Bilirubin: 0.9 mg/dL (ref 0.3–1.2)
Total Protein: 7.4 g/dL (ref 6.5–8.1)

## 2020-09-20 LAB — TYPE AND SCREEN
ABO/RH(D): O POS
Antibody Screen: NEGATIVE

## 2020-09-20 NOTE — Progress Notes (Signed)
U/a DONE 09/20/2020 ROUTED VIA Epic TO mELISSA cROSS AND dR ROSSI.

## 2020-09-20 NOTE — Telephone Encounter (Signed)
Prior to the CT scan of the abdomen and pelvis on 11-1-21you will need to take three doses of 50 mg oral prednisone administered 13 hrs prior (7pm on 10/31), 7 hrs prior  (1am in the morning of Nov 1) , and 1 hour prior(7am on Nov 1) prior to your CT scan and then 50 mg of benadryl orally administered 1 hour prior to your CT scan (7 am on 11/1).

## 2020-09-20 NOTE — Progress Notes (Signed)
Patient here for a pre-operative appointment prior to her scheduled surgery on October 01, 2020. She is scheduled for a robotic assisted total laparoscopic hysterectomy (removal of the uterus and cervix), unilateral salpingo-oophorectomy (removal of one ovary and fallopian tube), lysis of adhesions, possible staging if cancer is identified. She had her pre-admission testing appointment on 09/20/20 at [facility].  Her COVID test is scheduled for this Friday. The surgery was discussed in detail.  See after visit summary for additional details. Visual aids used to discuss items related to surgery including sequential compression stockings, foley catheter, IV pump, multi-modal pain regimen including tylenol, photo of the surgical robot, female reproductive system to discuss surgery in detail.  CT scan from today discussed in detail along with pre-op lab work and endometrial biopsy results. Patient denies any urinary symptoms.      Discussed post-op pain management in detail including the aspects of the enhanced recovery pathway.  Advised her that a new prescription would be sent in for tramadol and it is only to be used for after her upcoming surgery.  We discussed the use of tylenol post-op and to monitor for a maximum of 4,000 mg in a 24 hour period.  Also Miralax to be used after surgery and to hold if having loose stools.     Discussed the use of SCDs and measures to take at home to prevent DVT including frequent mobility.  Reportable signs and symptoms of DVT discussed. Post-operative instructions discussed and expectations for after surgery. Incisional care discussed as well including reportable signs and symptoms including erythema, drainage, wound separation.     15 minutes spent with the patient.  Verbalizing understanding of material discussed. No needs or concerns voiced at the end of the visit.   Advised patient and family to call for any needs.  Advised that her post-operative medications had been  prescribed and could be picked up at any time.

## 2020-09-20 NOTE — Telephone Encounter (Signed)
Spoke with Candice Ayala and she was able to repeat the directions as noted below for steroid prep prior to CT scan 09-23-20 0800.

## 2020-09-20 NOTE — Patient Instructions (Addendum)
Preparing for your Surgery  Plan for surgery on October 01, 2020 with Dr. Everitt Amber at [facility] will be scheduled for a robotic assisted total laparoscopic hysterectomy (removal of the uterus and cervix), unilateral salpingo-oophorectomy (removal of one ovary and fallopian tube), lysis of adhesions, possible staging if cancer is identified.   Pre-operative Testing - (Done) You will receive a phone call from presurgical testing at [facility] to arrange for a pre-operative appointment, lab appointment, and COVID test. The COVID test normally happens 3 days prior to the surgery and they ask that you self quarantine after the test up until surgery to decrease chance of exposure.  -Bring your insurance card, copy of an advanced directive if applicable, medication list  -At that visit, you will be asked to sign a consent for a possible blood transfusion in case a transfusion becomes necessary during surgery.  The need for a blood transfusion is rare but having consent is a necessary part of your care.     -You should not be taking blood thinners or aspirin at least ten days prior to surgery unless instructed by your surgeon.  -Do not take supplements such as fish oil (omega 3), red yeast rice, turmeric before your surgery.   Day Before Surgery at [facility] will be asked to take in a light diet the day before surgery. You will be advised you can have clear liquids up until 3 hours before your surgery.    Eat a light diet the day before surgery.  Examples including soups, broths, toast, yogurt, mashed potatoes.  AVOID GAS PRODUCING FOODS. Things to avoid include carbonated beverages (fizzy beverages), raw fruits and raw vegetables, or beans.   If your bowels are filled with gas, your surgeon will have difficulty visualizing your pelvic organs which increases your surgical risks.  Your role in recovery Your role is to become active as soon as directed by your doctor, while  still giving yourself time to heal.  Rest when you feel tired. You will be asked to do the following in order to speed your recovery:  - Cough and breathe deeply. This helps to clear and expand your lungs and can prevent pneumonia after surgery.  - [place]. Do mild physical activity. Walking or moving your legs help your circulation and body functions return to normal. Do not try to get up or walk alone the first time after surgery.   -If you develop swelling on one leg or the other, pain in the back of your leg, redness/warmth in one of your legs, please call the office or go to the Emergency Room to have a doppler to rule out a blood clot. For shortness of breath, chest pain-seek care in the Emergency Room as soon as possible. - Actively manage your pain. Managing your pain lets you move in comfort. We will ask you to rate your pain on a scale of zero to 10. It is your responsibility to tell your doctor or nurse where and how much you hurt so your pain can be treated.  Special Considerations -If you are diabetic, you may be placed on insulin after surgery to have closer control over your blood sugars to promote healing and recovery.  This does not mean that you will be discharged on insulin.  If applicable, your oral antidiabetics will be resumed when you are tolerating a solid diet.  -Your final pathology results from surgery should be available around one week  after surgery and the results will be relayed to you when available.  -Dr. Lahoma Crocker is the surgeon that assists your GYN Oncologist with surgery.  If you end up staying the night, the next day after your surgery you will either see Dr. Denman George, Dr. Berline Lopes, or Dr. Lahoma Crocker.  -FMLA forms can be faxed to [phone] and please allow 5-7 business days for completion.  Pain Management After Surgery -You have been prescribed your pain medication and bowel regimen medications before surgery so that you can  have these available when you are discharged from the hospital. The pain medication is for use ONLY AFTER surgery and a new prescription will not be given.   -Make sure that you have Tylenol and Ibuprofen at home to use on a regular basis after surgery for pain control. We recommend alternating the medications every hour to six hours since they work differently and are processed in the body differently for pain relief.  -Review the attached handout on narcotic use and their risks and side effects.   Bowel Regimen -You can use Miralax you have at home once a day for at least 4 days after surgery to prevent constipation.  It is important to prevent constipation and drink adequate amounts of liquids. You can stop taking this medication when you are not taking pain medication and you are back on your normal bowel routine.  Risks of Surgery Risks of surgery are low but include bleeding, infection, damage to surrounding structures, re-operation, blood clots, and very rarely death.   Blood Transfusion Information (For the consent to be signed before surgery)  We will be checking your blood type before surgery so in case of emergencies, we will know what type of blood you would need.                                            WHAT IS A BLOOD TRANSFUSION?  A transfusion is the replacement of blood or some of its parts. Blood is made up of multiple cells which provide different functions.  Red blood cells carry oxygen and are used for blood loss replacement.  White blood cells fight against infection.  Platelets control bleeding.  Plasma helps clot blood.  Other blood products are available for specialized needs, such as hemophilia or other clotting disorders. BEFORE THE TRANSFUSION  Who gives blood for transfusions?   You may be able to donate blood to be used at a later date on yourself (autologous donation).  Relatives can be asked to donate blood. This is generally not any safer than if  you have received blood from a stranger. The same precautions are taken to ensure safety when a relative's blood is donated.  Healthy volunteers who are fully evaluated to make sure their blood is safe. This is blood bank blood. Transfusion therapy is the safest it has ever been in the practice of medicine. Before blood is taken from a donor, a complete history is taken to make sure that person has no history of diseases nor engages in risky social behavior (examples are intravenous drug use or sexual activity with multiple partners). The donor's travel history is screened to minimize risk of transmitting infections, such as malaria. The donated blood is tested for signs of infectious diseases, such as HIV and hepatitis. The blood is then tested to be sure it is compatible with you  in order to minimize the chance of a transfusion reaction. If you or a relative donates blood, this is often done in anticipation of surgery and is not appropriate for emergency situations. It takes many days to process the donated blood. RISKS AND COMPLICATIONS Although transfusion therapy is very safe and saves many lives, the main dangers of transfusion include:   Getting an infectious disease.  Developing a transfusion reaction. This is an allergic reaction to something in the blood you were given. Every precaution is taken to prevent this. The decision to have a blood transfusion has been considered carefully by your caregiver before blood is given. Blood is not given unless the benefits outweigh the risks.  AFTER SURGERY INSTRUCTIONS  Return to work: 4 weeks if applicable  Activity: 1. Be up and out of the bed during the day.  Take a nap if needed.  You may walk up steps but be careful and use the hand rail.  Stair climbing will tire you more than you think, you may need to stop part way and rest.   2. No lifting or straining for 6 weeks over 10 pounds. No pushing, pulling, straining for 6 weeks.  3. No driving  for 1 week(s).  Do not drive if you are taking narcotic pain medicine and make sure that your reaction time has returned.   4. You can shower as soon as the next day after surgery. Shower daily.  Use your regular soap and water (not directly on the incision) and pat your incision(s) dry afterwards; don't rub.  No tub baths or submerging your body in water until cleared by your surgeon. If you have the soap that was given to you by pre-surgical testing that was used before surgery, you do not need to use it afterwards because this can irritate your incisions.   5. No sexual activity and nothing in the vagina for 8 weeks.  6. You may experience a small amount of clear drainage from your incisions, which is normal.  If the drainage persists, increases, or changes color please call the office.  7. Do not use creams, lotions, or ointments such as neosporin on your incisions after surgery until advised by your surgeon because they can cause removal of the dermabond glue on your incisions.    8. You may experience vaginal spotting after surgery or around the 6-8 week mark from surgery when the stitches at the top of the vagina begin to dissolve.  The spotting is normal but if you experience heavy bleeding, call our office.  9. Take Tylenol or ibuprofen first for pain and only use narcotic pain medication for severe pain not relieved by the Tylenol or Ibuprofen.  Monitor your Tylenol intake to a max of 4,000 mg in a 24 hour period. You can alternate these medications after surgery.  Diet: 1. Low sodium Heart Healthy Diet is recommended but you are cleared to resume your normal (before surgery) diet after your procedure.  2. It is safe to use a laxative, such as Miralax or Colace, if you have difficulty moving your bowels. You can use Miralax you have at home.  Wound Care: 1. Keep clean and dry.  Shower daily.  Reasons to call the Doctor:  Fever - Oral temperature greater than 100.4 degrees  Fahrenheit  Foul-smelling vaginal discharge  Difficulty urinating  Nausea and vomiting  Increased pain at the site of the incision that is unrelieved with pain medicine.  Difficulty breathing with or without chest pain  New calf pain especially if only on one side  Sudden, continuing increased vaginal bleeding with or without clots.   Contacts: For questions or concerns you should contact:  Dr. Everitt Amber at [phone]  Joylene John, NP at [phone]  After Hours: call [phone] and have the GYN Oncologist paged/contacted (after 5 pm or on the weekends)

## 2020-09-20 NOTE — Progress Notes (Signed)
DUE TO COVID-19 ONLY ONE VISITOR IS ALLOWED TO COME WITH YOU AND STAY IN THE WAITING ROOM ONLY DURING PRE OP AND PROCEDURE DAY OF SURGERY. THE 1 VISITOR  MAY VISIT WITH YOU AFTER SURGERY IN YOUR PRIVATE ROOM DURING VISITING HOURS ONLY!  YOU NEED TO HAVE A COVID 19 TEST ON__11/03/2020 _____ @_______ , THIS TEST MUST BE DONE BEFORE SURGERY,  COVID TESTING SITE [address] [place] [address], IT IS ON THE RIGHT GOING OUT [address] APPROXIMATELY  2 MINUTES PAST [employer] ON THE RIGHT. ONCE YOUR COVID TEST IS COMPLETED,  PLEASE BEGIN THE QUARANTINE INSTRUCTIONS AS OUTLINED IN YOUR HANDOUT.                Candice Ayala  09/20/2020   Your procedure is scheduled on: 10/01/2020    Report to [facility] Main  Entrance   Report to admitting at   12 noon      Call this number if you have problems the morning of surgery [phone]    Remember: Do not eat food , candy gum or mints :After Midnight. You may have clear liquids from midnight until 1100am     CLEAR LIQUID DIET   Foods Allowed                                                                       Coffee and tea, regular and decaf                              Plain Jell-O any favor except red or purple                                            Fruit ices (not with fruit pulp)                                      Iced Popsicles                                     Carbonated beverages, regular and diet                                    Cranberry, grape and apple juices Sports drinks like Gatorade Lightly seasoned clear broth or consume(fat free) Sugar, honey syrup   _____________________________________________________________________    BRUSH YOUR TEETH MORNING OF SURGERY AND RINSE YOUR MOUTH OUT, NO CHEWING GUM CANDY OR MINTS.     Take these medicines the morning of surgery with A SIP OF WATER:  Allegra, flonase                                You may not have any metal on your body  including hair pins and  piercings  Do not wear jewelry, make-up, lotions, powders or perfumes, deodorant             Do not wear nail polish on your fingernails.  Do not shave  48 hours prior to surgery.              Men may shave face and neck.   Do not bring valuables to the hospital. [place].  Contacts, dentures or bridgework may not be worn into surgery.  Leave suitcase in the car. After surgery it may be brought to your room.     Patients discharged the day of surgery will not be allowed to drive home. IF YOU ARE HAVING SURGERY AND GOING HOME THE SAME DAY, YOU MUST HAVE AN ADULT TO DRIVE YOU HOME AND BE WITH YOU FOR 24 HOURS. YOU MAY GO HOME BY TAXI OR UBER OR ORTHERWISE, BUT AN ADULT MUST ACCOMPANY YOU HOME AND STAY WITH YOU FOR 24 HOURS.  Name and phone number of your driver:  Special Instructions: N/A              Please read over the following fact sheets you were given: _____________________________________________________________________  [facility] - Preparing for Surgery Before surgery, you can play an important role.  Because skin is not sterile, your skin needs to be as free of germs as possible.  You can reduce the number of germs on your skin by washing with CHG (chlorahexidine gluconate) soap before surgery.  CHG is an antiseptic cleaner which kills germs and bonds with the skin to continue killing germs even after washing. Please DO NOT use if you have an allergy to CHG or antibacterial soaps.  If your skin becomes reddened/irritated stop using the CHG and inform your nurse when you arrive at Short Stay. Do not shave (including legs and underarms) for at least 48 hours prior to the first CHG shower.  You may shave your face/neck. Please follow these instructions carefully:  1.  Shower with CHG Soap the night before surgery and the  morning of Surgery.  2.  If you choose to wash your hair, wash your hair first  as usual with your  normal  shampoo.  3.  After you shampoo, rinse your hair and body thoroughly to remove the  shampoo.                           4.  Use CHG as you would any other liquid soap.  You can apply chg directly  to the skin and wash                       Gently with a scrungie or clean washcloth.  5.  Apply the CHG Soap to your body ONLY FROM THE NECK DOWN.   Do not use on face/ open                           Wound or open sores. Avoid contact with eyes, ears mouth and genitals (private parts).                       Wash face,  Genitals (private parts) with your normal soap.             6.  Wash thoroughly, paying special attention to the area where your surgery  will be performed.  7.  Thoroughly rinse your body with warm water from the neck down.  8.  DO NOT shower/wash with your normal soap after using and rinsing off  the CHG Soap.                9.  Pat yourself dry with a clean towel.            10.  Wear clean pajamas.            11.  Place clean sheets on your bed the night of your first shower and do not  sleep with pets. Day of Surgery : Do not apply any lotions/deodorants the morning of surgery.  Please wear clean clothes to the hospital/surgery center.  FAILURE TO FOLLOW THESE INSTRUCTIONS MAY RESULT IN THE CANCELLATION OF YOUR SURGERY PATIENT SIGNATURE_________________________________  NURSE SIGNATURE__________________________________  ________________________________________________________________________

## 2020-09-23 ENCOUNTER — Ambulatory Visit (HOSPITAL_COMMUNITY)
Admission: RE | Admit: 2020-09-23 | Discharge: 2020-09-23 | Disposition: A | Discharge status: Home / Self Care | Payer: Medicare Other | Source: Ambulatory Visit | Attending: Gynecologic Oncology | Admitting: Gynecologic Oncology

## 2020-09-23 ENCOUNTER — Encounter: Payer: Self-pay | Admitting: Gynecologic Oncology

## 2020-09-23 ENCOUNTER — Other Ambulatory Visit: Payer: Self-pay

## 2020-09-23 ENCOUNTER — Inpatient Hospital Stay: Payer: Medicare Other | Attending: Gynecologic Oncology | Admitting: Gynecologic Oncology

## 2020-09-23 ENCOUNTER — Encounter (HOSPITAL_COMMUNITY): Payer: Self-pay

## 2020-09-23 VITALS — BP 172/88 | HR 78 | Temp 98.6°F | Resp 20 | Ht 66.0 in | Wt 216.0 lb

## 2020-09-23 DIAGNOSIS — R1900 Intra-abdominal and pelvic swelling, mass and lump, unspecified site: Secondary | ICD-10-CM

## 2020-09-23 DIAGNOSIS — N950 Postmenopausal bleeding: Secondary | ICD-10-CM

## 2020-09-23 DIAGNOSIS — Z90710 Acquired absence of both cervix and uterus: Secondary | ICD-10-CM | POA: Insufficient documentation

## 2020-09-23 DIAGNOSIS — Z90722 Acquired absence of ovaries, bilateral: Secondary | ICD-10-CM | POA: Insufficient documentation

## 2020-09-23 DIAGNOSIS — C562 Malignant neoplasm of left ovary: Secondary | ICD-10-CM | POA: Insufficient documentation

## 2020-09-23 DIAGNOSIS — D271 Benign neoplasm of left ovary: Secondary | ICD-10-CM | POA: Insufficient documentation

## 2020-09-23 MED ORDER — IBUPROFEN 600 MG PO TABS: 600.0000 mg | ORAL_TABLET | Freq: Four times a day (QID) | ORAL | 0 refills | Status: DC | PRN

## 2020-09-23 MED ORDER — TRAMADOL HCL 50 MG PO TABS: 50.0000 mg | ORAL_TABLET | Freq: Four times a day (QID) | ORAL | 0 refills | Status: DC | PRN

## 2020-09-23 MED ORDER — IOHEXOL 300 MG/ML  SOLN
100.0000 mL | Freq: Once | INTRAMUSCULAR | Status: AC | PRN
  Administered 2020-09-23: 100 mL via INTRAVENOUS

## 2020-09-27 ENCOUNTER — Other Ambulatory Visit (HOSPITAL_COMMUNITY)
Admission: RE | Admit: 2020-09-27 | Discharge: 2020-09-27 | Disposition: A | Discharge status: Home / Self Care | Payer: Medicare Other | Source: Ambulatory Visit | Attending: Gynecologic Oncology | Admitting: Gynecologic Oncology

## 2020-09-27 DIAGNOSIS — Z01812 Encounter for preprocedural laboratory examination: Secondary | ICD-10-CM | POA: Diagnosis present

## 2020-09-27 DIAGNOSIS — Z20822 Contact with and (suspected) exposure to covid-19: Secondary | ICD-10-CM | POA: Diagnosis not present

## 2020-09-27 LAB — SARS CORONAVIRUS 2 (TAT 6-24 HRS): SARS Coronavirus 2: NEGATIVE

## 2020-09-30 ENCOUNTER — Telehealth: Payer: Self-pay

## 2020-09-30 NOTE — Telephone Encounter (Signed)
Candice Ayala that's that she understands her pre op instructions and has no questions or concerns at this time.

## 2020-10-01 ENCOUNTER — Other Ambulatory Visit: Payer: Self-pay

## 2020-10-01 ENCOUNTER — Encounter (HOSPITAL_COMMUNITY)
Admission: RE | Disposition: A | Discharge status: Home / Self Care | Payer: Self-pay | Source: Ambulatory Visit | Attending: Gynecologic Oncology

## 2020-10-01 ENCOUNTER — Ambulatory Visit (HOSPITAL_COMMUNITY)
Admission: RE | Admit: 2020-10-01 | Discharge: 2020-10-01 | Disposition: A | Discharge status: Home / Self Care | Payer: Medicare Other | Source: Ambulatory Visit | Attending: Gynecologic Oncology | Admitting: Gynecologic Oncology

## 2020-10-01 ENCOUNTER — Ambulatory Visit (HOSPITAL_COMMUNITY): Payer: Medicare Other | Admitting: Certified Registered Nurse Anesthetist

## 2020-10-01 ENCOUNTER — Encounter (HOSPITAL_COMMUNITY): Payer: Self-pay | Admitting: Gynecologic Oncology

## 2020-10-01 DIAGNOSIS — N838 Other noninflammatory disorders of ovary, fallopian tube and broad ligament: Secondary | ICD-10-CM

## 2020-10-01 DIAGNOSIS — Z800 Family history of malignant neoplasm of digestive organs: Secondary | ICD-10-CM | POA: Insufficient documentation

## 2020-10-01 DIAGNOSIS — N950 Postmenopausal bleeding: Secondary | ICD-10-CM

## 2020-10-01 DIAGNOSIS — E7800 Pure hypercholesterolemia, unspecified: Secondary | ICD-10-CM | POA: Diagnosis not present

## 2020-10-01 DIAGNOSIS — C562 Malignant neoplasm of left ovary: Secondary | ICD-10-CM | POA: Diagnosis present

## 2020-10-01 DIAGNOSIS — N72 Inflammatory disease of cervix uteri: Secondary | ICD-10-CM | POA: Insufficient documentation

## 2020-10-01 DIAGNOSIS — Z91041 Radiographic dye allergy status: Secondary | ICD-10-CM | POA: Insufficient documentation

## 2020-10-01 DIAGNOSIS — Z7952 Long term (current) use of systemic steroids: Secondary | ICD-10-CM | POA: Diagnosis not present

## 2020-10-01 DIAGNOSIS — D259 Leiomyoma of uterus, unspecified: Secondary | ICD-10-CM | POA: Insufficient documentation

## 2020-10-01 DIAGNOSIS — Z79899 Other long term (current) drug therapy: Secondary | ICD-10-CM | POA: Insufficient documentation

## 2020-10-01 DIAGNOSIS — K219 Gastro-esophageal reflux disease without esophagitis: Secondary | ICD-10-CM | POA: Insufficient documentation

## 2020-10-01 DIAGNOSIS — R1900 Intra-abdominal and pelvic swelling, mass and lump, unspecified site: Secondary | ICD-10-CM

## 2020-10-01 DIAGNOSIS — N83202 Unspecified ovarian cyst, left side: Secondary | ICD-10-CM | POA: Diagnosis present

## 2020-10-01 DIAGNOSIS — N840 Polyp of corpus uteri: Secondary | ICD-10-CM | POA: Insufficient documentation

## 2020-10-01 DIAGNOSIS — Z6834 Body mass index (BMI) 34.0-34.9, adult: Secondary | ICD-10-CM | POA: Insufficient documentation

## 2020-10-01 DIAGNOSIS — Z8249 Family history of ischemic heart disease and other diseases of the circulatory system: Secondary | ICD-10-CM | POA: Insufficient documentation

## 2020-10-01 DIAGNOSIS — I10 Essential (primary) hypertension: Secondary | ICD-10-CM | POA: Insufficient documentation

## 2020-10-01 DIAGNOSIS — E669 Obesity, unspecified: Secondary | ICD-10-CM | POA: Diagnosis not present

## 2020-10-01 HISTORY — PX: ROBOTIC ASSISTED TOTAL HYSTERECTOMY WITH BILATERAL SALPINGO OOPHERECTOMY: SHX6086

## 2020-10-01 HISTORY — PX: LYSIS OF ADHESION: SHX5961

## 2020-10-01 LAB — ABO/RH: ABO/RH(D): O POS

## 2020-10-01 SURGERY — HYSTERECTOMY, TOTAL, ROBOT-ASSISTED, LAPAROSCOPIC, WITH BILATERAL SALPINGO-OOPHORECTOMY
Anesthesia: General | Site: Abdomen

## 2020-10-01 MED ORDER — MIDAZOLAM HCL 2 MG/2ML IJ SOLN
INTRAMUSCULAR | Status: AC
  Filled 2020-10-01: qty 2

## 2020-10-01 MED ORDER — SCOPOLAMINE 1 MG/3DAYS TD PT72: 1.0000 | MEDICATED_PATCH | TRANSDERMAL | Status: DC

## 2020-10-01 MED ORDER — EPHEDRINE SULFATE-NACL 50-0.9 MG/10ML-% IV SOSY
PREFILLED_SYRINGE | INTRAVENOUS | Status: DC | PRN
  Administered 2020-10-01: 10 mg via INTRAVENOUS
  Administered 2020-10-01: 5 mg via INTRAVENOUS

## 2020-10-01 MED ORDER — DEXAMETHASONE SODIUM PHOSPHATE 10 MG/ML IJ SOLN
INTRAMUSCULAR | Status: DC | PRN
  Administered 2020-10-01: 10 mg via INTRAVENOUS

## 2020-10-01 MED ORDER — BUPIVACAINE HCL 0.25 % IJ SOLN
INTRAMUSCULAR | Status: DC | PRN
  Administered 2020-10-01: 19 mL

## 2020-10-01 MED ORDER — DEXAMETHASONE SODIUM PHOSPHATE 4 MG/ML IJ SOLN: 4.0000 mg | INTRAMUSCULAR | Status: DC

## 2020-10-01 MED ORDER — SCOPOLAMINE 1 MG/3DAYS TD PT72
MEDICATED_PATCH | TRANSDERMAL | Status: AC
  Administered 2020-10-01: 1.5 mg via TRANSDERMAL
  Filled 2020-10-01: qty 1

## 2020-10-01 MED ORDER — KETAMINE HCL 10 MG/ML IJ SOLN
INTRAMUSCULAR | Status: DC | PRN
  Administered 2020-10-01: 40 mg via INTRAVENOUS

## 2020-10-01 MED ORDER — ONDANSETRON HCL 4 MG/2ML IJ SOLN: 4.0000 mg | Freq: Once | INTRAMUSCULAR | Status: DC | PRN

## 2020-10-01 MED ORDER — SODIUM CHLORIDE 0.9% FLUSH: 3.0000 mL | Freq: Two times a day (BID) | INTRAVENOUS | Status: DC

## 2020-10-01 MED ORDER — FENTANYL CITRATE (PF) 100 MCG/2ML IJ SOLN: 25.0000 ug | INTRAMUSCULAR | Status: DC | PRN

## 2020-10-01 MED ORDER — MIDAZOLAM HCL 5 MG/5ML IJ SOLN
INTRAMUSCULAR | Status: DC | PRN
  Administered 2020-10-01: 2 mg via INTRAVENOUS

## 2020-10-01 MED ORDER — ONDANSETRON HCL 4 MG/2ML IJ SOLN
INTRAMUSCULAR | Status: AC
  Filled 2020-10-01: qty 2

## 2020-10-01 MED ORDER — OXYCODONE HCL 5 MG/5ML PO SOLN: 5.0000 mg | Freq: Once | ORAL | Status: DC | PRN

## 2020-10-01 MED ORDER — PHENYLEPHRINE 40 MCG/ML (10ML) SYRINGE FOR IV PUSH (FOR BLOOD PRESSURE SUPPORT)
PREFILLED_SYRINGE | INTRAVENOUS | Status: AC
  Filled 2020-10-01: qty 30

## 2020-10-01 MED ORDER — LIDOCAINE 2% (20 MG/ML) 5 ML SYRINGE
INTRAMUSCULAR | Status: AC
  Filled 2020-10-01: qty 5

## 2020-10-01 MED ORDER — BUPIVACAINE HCL 0.25 % IJ SOLN
INTRAMUSCULAR | Status: AC
  Filled 2020-10-01: qty 1

## 2020-10-01 MED ORDER — STERILE WATER FOR IRRIGATION IR SOLN
Status: DC | PRN
  Administered 2020-10-01: 1000 mL

## 2020-10-01 MED ORDER — ORAL CARE MOUTH RINSE: 15.0000 mL | Freq: Once | OROMUCOSAL | Status: AC

## 2020-10-01 MED ORDER — PROPOFOL 10 MG/ML IV BOLUS
INTRAVENOUS | Status: AC
  Filled 2020-10-01: qty 20

## 2020-10-01 MED ORDER — CHLORHEXIDINE GLUCONATE 0.12 % MT SOLN
15.0000 mL | Freq: Once | OROMUCOSAL | Status: AC
  Administered 2020-10-01: 15 mL via OROMUCOSAL

## 2020-10-01 MED ORDER — OXYCODONE HCL 5 MG PO TABS: 5.0000 mg | ORAL_TABLET | Freq: Once | ORAL | Status: DC | PRN

## 2020-10-01 MED ORDER — PROPOFOL 10 MG/ML IV BOLUS
INTRAVENOUS | Status: DC | PRN
  Administered 2020-10-01: 200 mg via INTRAVENOUS

## 2020-10-01 MED ORDER — ENOXAPARIN SODIUM 40 MG/0.4ML ~~LOC~~ SOLN
SUBCUTANEOUS | Status: AC
  Administered 2020-10-01: 40 mg via SUBCUTANEOUS
  Filled 2020-10-01: qty 0.4

## 2020-10-01 MED ORDER — ROCURONIUM BROMIDE 10 MG/ML (PF) SYRINGE
PREFILLED_SYRINGE | INTRAVENOUS | Status: DC | PRN
  Administered 2020-10-01: 60 mg via INTRAVENOUS

## 2020-10-01 MED ORDER — ACETAMINOPHEN 500 MG PO TABS
ORAL_TABLET | ORAL | Status: AC
  Administered 2020-10-01: 1000 mg via ORAL
  Filled 2020-10-01: qty 2

## 2020-10-01 MED ORDER — ROCURONIUM BROMIDE 10 MG/ML (PF) SYRINGE
PREFILLED_SYRINGE | INTRAVENOUS | Status: AC
  Filled 2020-10-01: qty 10

## 2020-10-01 MED ORDER — SUGAMMADEX SODIUM 200 MG/2ML IV SOLN
INTRAVENOUS | Status: DC | PRN
  Administered 2020-10-01: 200 mg via INTRAVENOUS

## 2020-10-01 MED ORDER — GABAPENTIN 100 MG PO CAPS: 200.0000 mg | ORAL_CAPSULE | ORAL | Status: AC

## 2020-10-01 MED ORDER — EPHEDRINE 5 MG/ML INJ
INTRAVENOUS | Status: AC
  Filled 2020-10-01: qty 30

## 2020-10-01 MED ORDER — ACETAMINOPHEN 500 MG PO TABS: 1000.0000 mg | ORAL_TABLET | ORAL | Status: AC

## 2020-10-01 MED ORDER — LACTATED RINGERS IV SOLN: INTRAVENOUS | Status: DC

## 2020-10-01 MED ORDER — DEXAMETHASONE SODIUM PHOSPHATE 10 MG/ML IJ SOLN
INTRAMUSCULAR | Status: AC
  Filled 2020-10-01: qty 1

## 2020-10-01 MED ORDER — CEFAZOLIN SODIUM-DEXTROSE 2-4 GM/100ML-% IV SOLN
INTRAVENOUS | Status: AC
  Filled 2020-10-01: qty 100

## 2020-10-01 MED ORDER — LIDOCAINE 2% (20 MG/ML) 5 ML SYRINGE
INTRAMUSCULAR | Status: DC | PRN
  Administered 2020-10-01: 60 mg via INTRAVENOUS
  Administered 2020-10-01: 1 mg/kg/h via INTRAVENOUS

## 2020-10-01 MED ORDER — FENTANYL CITRATE (PF) 100 MCG/2ML IJ SOLN
INTRAMUSCULAR | Status: DC | PRN
  Administered 2020-10-01: 100 ug via INTRAVENOUS

## 2020-10-01 MED ORDER — ONDANSETRON HCL 4 MG/2ML IJ SOLN
INTRAMUSCULAR | Status: DC | PRN
  Administered 2020-10-01: 4 mg via INTRAVENOUS

## 2020-10-01 MED ORDER — GABAPENTIN 100 MG PO CAPS
ORAL_CAPSULE | ORAL | Status: AC
  Administered 2020-10-01: 200 mg via ORAL
  Filled 2020-10-01: qty 2

## 2020-10-01 MED ORDER — ENOXAPARIN SODIUM 40 MG/0.4ML ~~LOC~~ SOLN: 40.0000 mg | SUBCUTANEOUS | Status: AC

## 2020-10-01 MED ORDER — FENTANYL CITRATE (PF) 100 MCG/2ML IJ SOLN
INTRAMUSCULAR | Status: AC
  Filled 2020-10-01: qty 2

## 2020-10-01 MED ORDER — KETAMINE HCL 10 MG/ML IJ SOLN
INTRAMUSCULAR | Status: AC
  Filled 2020-10-01: qty 1

## 2020-10-01 MED ORDER — CEFAZOLIN SODIUM-DEXTROSE 2-4 GM/100ML-% IV SOLN
2.0000 g | INTRAVENOUS | Status: AC
  Administered 2020-10-01: 2 g via INTRAVENOUS

## 2020-10-01 MED ORDER — LACTATED RINGERS IR SOLN
Status: DC | PRN
  Administered 2020-10-01: 1000 mL

## 2020-10-01 SURGICAL SUPPLY — 69 items
ADH SKN CLS APL DERMABOND .7 (GAUZE/BANDAGES/DRESSINGS) ×1
AGENT HMST KT MTR STRL THRMB (HEMOSTASIS)
APL ESCP 34 STRL LF DISP (HEMOSTASIS)
APPLICATOR SURGIFLO ENDO (HEMOSTASIS) IMPLANT
BACTOSHIELD CHG 4% 4OZ (MISCELLANEOUS) ×1
BAG LAPAROSCOPIC 12 15 PORT 16 (BASKET) IMPLANT
BAG RETRIEVAL 12/15 (BASKET) ×2
BAG SPEC RTRVL LRG 6X4 10 (ENDOMECHANICALS)
BLADE SURG SZ10 CARB STEEL (BLADE) IMPLANT
COVER BACK TABLE 60X90IN (DRAPES) ×2 IMPLANT
COVER TIP SHEARS 8 DVNC (MISCELLANEOUS) ×1 IMPLANT
COVER TIP SHEARS 8MM DA VINCI (MISCELLANEOUS) ×2
COVER WAND RF STERILE (DRAPES) IMPLANT
DECANTER SPIKE VIAL GLASS SM (MISCELLANEOUS) IMPLANT
DERMABOND ADVANCED (GAUZE/BANDAGES/DRESSINGS) ×1
DERMABOND ADVANCED .7 DNX12 (GAUZE/BANDAGES/DRESSINGS) ×1 IMPLANT
DRAPE ARM DVNC X/XI (DISPOSABLE) ×4 IMPLANT
DRAPE COLUMN DVNC XI (DISPOSABLE) ×1 IMPLANT
DRAPE DA VINCI XI ARM (DISPOSABLE) ×8
DRAPE DA VINCI XI COLUMN (DISPOSABLE) ×2
DRAPE SHEET LG 3/4 BI-LAMINATE (DRAPES) ×2 IMPLANT
DRAPE SURG IRRIG POUCH 19X23 (DRAPES) ×2 IMPLANT
DRSG OPSITE POSTOP 4X6 (GAUZE/BANDAGES/DRESSINGS) IMPLANT
DRSG OPSITE POSTOP 4X8 (GAUZE/BANDAGES/DRESSINGS) IMPLANT
ELECT PENCIL ROCKER SW 15FT (MISCELLANEOUS) ×1 IMPLANT
ELECT REM PT RETURN 15FT ADLT (MISCELLANEOUS) ×2 IMPLANT
GLOVE BIO SURGEON STRL SZ 6 (GLOVE) ×8 IMPLANT
GLOVE BIO SURGEON STRL SZ 6.5 (GLOVE) ×4 IMPLANT
GOWN STRL REUS W/ TWL LRG LVL3 (GOWN DISPOSABLE) ×4 IMPLANT
GOWN STRL REUS W/TWL LRG LVL3 (GOWN DISPOSABLE) ×8
HOLDER FOLEY CATH W/STRAP (MISCELLANEOUS) ×2 IMPLANT
IRRIG SUCT STRYKERFLOW 2 WTIP (MISCELLANEOUS) ×2
IRRIGATION SUCT STRKRFLW 2 WTP (MISCELLANEOUS) ×1 IMPLANT
KIT PROCEDURE DA VINCI SI (MISCELLANEOUS)
KIT PROCEDURE DVNC SI (MISCELLANEOUS) IMPLANT
KIT TURNOVER KIT A (KITS) IMPLANT
MANIPULATOR UTERINE 4.5 ZUMI (MISCELLANEOUS) ×2 IMPLANT
NDL SPNL 18GX3.5 QUINCKE PK (NEEDLE) IMPLANT
NEEDLE HYPO 22GX1.5 SAFETY (NEEDLE) ×2 IMPLANT
NEEDLE SPNL 18GX3.5 QUINCKE PK (NEEDLE) IMPLANT
OBTURATOR OPTICAL STANDARD 8MM (TROCAR) ×2
OBTURATOR OPTICAL STND 8 DVNC (TROCAR) ×1
OBTURATOR OPTICALSTD 8 DVNC (TROCAR) ×1 IMPLANT
PACK ROBOT GYN CUSTOM WL (TRAY / TRAY PROCEDURE) ×2 IMPLANT
PAD POSITIONING PINK XL (MISCELLANEOUS) ×2 IMPLANT
PORT ACCESS TROCAR AIRSEAL 12 (TROCAR) ×1 IMPLANT
PORT ACCESS TROCAR AIRSEAL 5M (TROCAR) ×1
POUCH SPECIMEN RETRIEVAL 10MM (ENDOMECHANICALS) IMPLANT
SCRUB CHG 4% DYNA-HEX 4OZ (MISCELLANEOUS) ×1 IMPLANT
SEAL CANN UNIV 5-8 DVNC XI (MISCELLANEOUS) ×3 IMPLANT
SEAL XI 5MM-8MM UNIVERSAL (MISCELLANEOUS) ×6
SET TRI-LUMEN FLTR TB AIRSEAL (TUBING) ×2 IMPLANT
SPONGE LAP 18X18 RF (DISPOSABLE) IMPLANT
SURGIFLO W/THROMBIN 8M KIT (HEMOSTASIS) IMPLANT
SUT MNCRL AB 4-0 PS2 18 (SUTURE) ×2 IMPLANT
SUT PDS AB 1 TP1 96 (SUTURE) IMPLANT
SUT VIC AB 0 CT1 27 (SUTURE)
SUT VIC AB 0 CT1 27XBRD ANTBC (SUTURE) IMPLANT
SUT VIC AB 2-0 CT1 27 (SUTURE)
SUT VIC AB 2-0 CT1 TAPERPNT 27 (SUTURE) IMPLANT
SUT VIC AB 4-0 PS2 18 (SUTURE) ×4 IMPLANT
SYR 10ML LL (SYRINGE) IMPLANT
SYR 20ML LL LF (SYRINGE) IMPLANT
TOWEL OR NON WOVEN STRL DISP B (DISPOSABLE) ×2 IMPLANT
TRAP SPECIMEN MUCUS 40CC (MISCELLANEOUS) ×1 IMPLANT
TRAY FOLEY MTR SLVR 16FR STAT (SET/KITS/TRAYS/PACK) ×2 IMPLANT
TROCAR XCEL NON-BLD 5MMX100MML (ENDOMECHANICALS) IMPLANT
UNDERPAD 30X36 HEAVY ABSORB (UNDERPADS AND DIAPERS) ×2 IMPLANT
WATER STERILE IRR 1000ML POUR (IV SOLUTION) ×2 IMPLANT

## 2020-10-01 NOTE — Discharge Instructions (Signed)
10/01/2020  Return to work: 2-4 weeks  Activity: 1. Be up and out of the bed during the day.  Take a nap if needed.  You may walk up steps but be careful and use the hand rail.  Stair climbing will tire you more than you think, you may need to stop part way and rest.   2. No lifting or straining for 6 weeks.  3. No driving for 1 week.  Do Not drive if you are taking narcotic pain medicine.  4. Shower daily.  Use soap and water on your incision and pat dry; don't rub.   5. No sexual activity and nothing in the vagina for 8 weeks.  Medications:  - Take ibuprofen and tylenol first line for pain control. Take these regularly (every 6 hours) to decrease the build up of pain.  - If necessary, for severe pain not relieved by ibuprofen, take oxycodone.  - While taking oxycodone you should take sennakot every night to reduce the likelihood of constipation. If this causes diarrhea, stop its use.  Diet: 1. Low sodium Heart Healthy Diet is recommended.  2. It is safe to use a laxative if you have difficulty moving your bowels.   Wound Care: 1. Keep clean and dry.  Shower daily.  Reasons to call the Doctor:   Fever - Oral temperature greater than 100.4 degrees Fahrenheit  Foul-smelling vaginal discharge  Difficulty urinating  Nausea and vomiting  Increased pain at the site of the incision that is unrelieved with pain medicine.  Difficulty breathing with or without chest pain  New calf pain especially if only on one side  Sudden, continuing increased vaginal bleeding with or without clots.   Follow-up: 1. See Everitt Amber in 3-4 weeks.  Contacts: For questions or concerns you should contact:  Dr. Everitt Amber at [phone] After hours and on week-ends call [phone] and ask to speak to the physician on call for Gynecologic Oncology   After Your Surgery  The information in this section will tell you what to expect after your surgery, both during your stay and after you  leave. You will learn how to safely recover from your surgery. Write down any questions you have and be sure to ask your doctor or nurse.  What to Expect When you wake up after your surgery, you will be in the [facility] (PACU) or your recovery room. A nurse will be monitoring your body temperature, blood pressure, pulse, and oxygen levels. You may have a urinary catheter in your bladder to help monitor the amount of urine you are making. It should come out before you go home. You will also have compression boots on your lower legs to help your circulation. Your pain medication will be given through an IV line or in tablet form. If you are having pain, tell your nurse. Your nurse will tell you how to recover from your surgery. Below are examples of ways you can help yourself recover safely. . You will be encouraged to walk with the help of your nurse or physical therapist. We will give you medication to relieve pain. Walking helps reduce the risk for blood clots and pneumonia. It also helps to stimulate your bowels so they begin working again. . Use your incentive spirometer. This will help your lungs expand, which prevents pneumonia.   Commonly Asked Questions  Will I have pain after surgery? Yes, you will have some pain after your surgery, especially in the first few days.  Your doctor and nurse will ask you about your pain often. You will be given medication to manage your pain as needed. If your pain is not relieved, please tell your doctor or nurse. It is important to control your pain so you can cough, breathe deeply, use your incentive spirometer, and get out of bed and walk.  Will I be able to eat? Yes, you will be able to eat a regular diet or eat as tolerated. You should start with foods that are soft and easy to digest such as apple sauce and chicken noodle soup. Eat small meals frequently, and then advance to regular foods. If you experience bloating, gas, or cramps,  limit high-fiber foods, including whole grain breads and cereal, nuts, seeds, salads, fresh fruit, broccoli, cabbage, and cauliflower. Will I have pain when I am home? The length of time each person has pain or discomfort varies. You may still have some pain when you go home and will probably be taking pain medication. Follow the guidelines below. . Take your medications as directed and as needed. . Call your doctor if the medication prescribed for you doesn't relieve your pain. . Don't drive or drink alcohol while you're taking prescription pain medication. . As your incision heals, you will have less pain and need less pain medication. A mild pain reliever such as acetaminophen (Tylenol) or ibuprofen (Advil) will relieve aches and discomfort. However, large quantities of acetaminophen may be harmful to your liver. Don't take more acetaminophen than the amount directed on the bottle or as instructed by your doctor or nurse. . Pain medication should help you as you resume your normal activities. Take enough medication to do your exercises comfortably. Pain medication is most effective 30 to 45 minutes after taking it. Marland Kitchen Keep track of when you take your pain medication. Taking it when your pain first begins is more effective than waiting for the pain to get worse. Pain medication may cause constipation (having fewer bowel movements than what is normal for you).  How can I prevent constipation? . Go to the bathroom at the same time every day. Your body will get used to going at that time. . If you feel the urge to go, don't put it off. Try to use the bathroom 5 to 15 minutes after meals. . After breakfast is a good time to move your bowels. The reflexes in your colon are strongest at this time. . Exercise, if you can. Walking is an excellent form of exercise. . Drink 8 (8-ounce) glasses (2 liters) of liquids daily, if you can. Drink water, juices, soups, ice cream shakes, and other drinks that don't  have caffeine. Drinks with caffeine, such as coffee and soda, pull fluid out of the body. . Slowly increase the fiber in your diet to 25 to 35 grams per day. Fruits, vegetables, whole grains, and cereals contain fiber. If you have an ostomy or have had recent bowel surgery, check with your doctor or nurse before making any changes in your diet. . Both over-the-counter and prescription medications are available to treat constipation. Start with 1 of the following over-the-counter medications first: o Docusate sodium (Colace) 100 mg. Take ___1__ capsules _2____ times a day. This is a stool softener that causes few side effects. Don't take it with mineral oil. o Polyethylene glycol (MiraLAX) 17 grams daily. o Senna (Senokot) 2 tablets at bedtime. This is a stimulant laxative, which can cause cramping. . If you haven't had a bowel movement in 2  days, call your doctor or nurse.  Can I shower? Yes, you should shower 24 hours after your surgery. Be sure to shower every day. Taking a warm shower is relaxing and can help decrease muscle aches. Use soap when you shower and gently wash your incision. Pat the areas dry with a towel after showering, and leave your incision uncovered (unless there is drainage). Call your doctor if you see any redness or drainage from your incision. Don't take tub baths until you discuss it with your doctor at the first appointment after your surgery. How do I care for my incisions? You will have several small incisions on your abdomen. The incisions are closed with Steri-Strips or Dermabond. You may also have square white dressings on your incisions ([place]). You can remove these in the shower 24 hours after your surgery. You should clean your incisions with soap and water. If you go home with Steri-Strips on your incision, they will loosen and may fall off by themselves. If they haven't fallen off within 10 days, you can remove them. If you go home with Dermabond over your  sutures (stitches), it will also loosen and peel off.  What are the most common symptoms after a hysterectomy? It's common for you to have some vaginal spotting or light bleeding. You should monitor this with a pad or a panty liner. If you have having heavy bleeding (bleeding through a pad or liner every 1 to 2 hours), call your doctor right away. It's also common to have some discomfort after surgery from the air that was pumped into your abdomen during surgery. To help with this, walk, drink plenty of liquids and make sure to take the stool softeners you received.  When is it safe for me to drive? You may resume driving 2 weeks after surgery, as long as you are not taking pain medication that may make you drowsy.  When can I resume sexual activity? Do not place anything in your vagina or have vaginal intercourse for 8 weeks after your surgery. Some people will need to wait longer than 8 weeks, so speak with your doctor before resuming sexual intercourse.  Will I be able to travel? Yes, you can travel. If you are traveling by plane within a few weeks after your surgery, make sure you get up and walk every hour. Be sure to stretch your legs, drink plenty of liquids, and keep your feet elevated when possible.  Will I need any supplies? Most people do not need any supplies after the surgery. In the rare case that you do need supplies, such as tubes or drains, your nurse will order them for you.  When can I return to work? The time it takes to return to work depends on the type of work you do, the type of surgery you had, and how fast your body heals. Most people can return to work about 2 to 4 weeks after the surgery.  What exercises can I do? Exercise will help you gain strength and feel better. Walking and stair climbing are excellent forms of exercise. Gradually increase the distance you walk. Climb stairs slowly, resting or stopping as needed. Ask your doctor or nurse before starting more  strenuous exercises.  When can I lift heavy objects? Most people should not lift anything heavier than 10 pounds (4.5 kilograms) for at least 4 weeks after surgery. Speak with your doctor about when you can do heavy lifting.  How can I cope with my feelings? After surgery  for a serious illness, you may have new and upsetting feelings. Many people say they felt weepy, sad, worried, nervous, irritable, and angry at one time or another. You may find that you can't control some of these feelings. If this happens, it's a good idea to seek emotional support. The first step in coping is to talk about how you feel. Family and friends can help. Your nurse, doctor, and social worker can reassure, support, and guide you. It's always a good idea to let these professionals know how you, your family, and your friends are feeling emotionally. Many resources are available to patients and their families. Whether you're in the hospital or at home, the nurses, doctors, and social workers are here to help you and your family and friends handle the emotional aspects of your illness.  When is my first appointment after surgery? Your first appointment after surgery will be 2 to 4 weeks after surgery. Your nurse will give you instructions on how to make this appointment, including the phone number to call.  What if I have other questions? If you have any questions or concerns, please talk with your doctor or nurse. You can reach them Monday through Friday from 9:00 am to 5:00 pm. After 5:00 pm, during the weekend, and on holidays, call [phone] and ask for the doctor on call for your doctor.  . Have a temperature of 101 F (38.3 C) or higher . Have pain that does not get better with pain medication . Have redness, drainage, or swelling from your  incisions

## 2020-10-01 NOTE — Anesthesia Preprocedure Evaluation (Addendum)
Anesthesia Evaluation  Patient identified by MRN, date of birth, ID band Patient awake    Reviewed: Allergy & Precautions, NPO status , Patient's Chart, lab work & pertinent test results  History of Anesthesia Complications (+) PONV and history of anesthetic complications  Airway Mallampati: II  TM Distance: >3 FB Neck ROM: Full    Dental  (+) Dental Advisory Given, Teeth Intact   Pulmonary neg pulmonary ROS,    Pulmonary exam normal        Cardiovascular hypertension, Pt. on medications Normal cardiovascular exam     Neuro/Psych negative neurological ROS  negative psych ROS   GI/Hepatic negative GI ROS, Neg liver ROS,   Endo/Other   Obesity   Renal/GU negative Renal ROS     Musculoskeletal  (+) Arthritis ,   Abdominal   Peds  Hematology negative hematology ROS (+)   Anesthesia Other Findings Covid test negative   Reproductive/Obstetrics                            Anesthesia Physical Anesthesia Plan  ASA: II  Anesthesia Plan: General   Post-op Pain Management:    Induction: Intravenous  PONV Risk Score and Plan: 4 or greater and Treatment may vary due to age or medical condition, Ondansetron, Scopolamine patch - Pre-op, Midazolam and Dexamethasone  Airway Management Planned: Oral ETT  Additional Equipment: None  Intra-op Plan:   Post-operative Plan: Extubation in OR  Informed Consent: I have reviewed the patients History and Physical, chart, labs and discussed the procedure including the risks, benefits and alternatives for the proposed anesthesia with the patient or authorized representative who has indicated his/her understanding and acceptance.     Dental advisory given  Plan Discussed with: CRNA, Anesthesiologist and Surgeon  Anesthesia Plan Comments:        Anesthesia Quick Evaluation

## 2020-10-01 NOTE — Interval H&P Note (Signed)
History and Physical Interval Note:  10/01/2020 4:40 PM  Candice Ayala  has presented today for surgery, with the diagnosis of LEFT OVARIAN MASS,ELEVATED CA 125.  The various methods of treatment have been discussed with the patient and family. After consideration of risks, benefits and other options for treatment, the patient has consented to  Procedure(s): XI ROBOTIC ASSISTED TOTAL HYSTERECTOMY WITH UNILATERL  SALPINGO OOPHORECTOMY (N/A) LYSIS OF ADHESION (N/A) as a surgical intervention.  The patient's history has been reviewed, patient examined, no change in status, stable for surgery.  I have reviewed the patient's chart and labs.  Questions were answered to the patient's satisfaction.     Thereasa Solo

## 2020-10-01 NOTE — Anesthesia Procedure Notes (Signed)
Procedure Name: Intubation Date/Time: 10/01/2020 3:12 PM Performed by: Gerald Leitz, CRNA Pre-anesthesia Checklist: Patient identified, Patient being monitored, Timeout performed, Emergency Drugs available and Suction available Patient Re-evaluated:Patient Re-evaluated prior to induction Oxygen Delivery Method: Circle system utilized Preoxygenation: Pre-oxygenation with 100% oxygen Induction Type: IV induction Ventilation: Mask ventilation without difficulty Laryngoscope Size: Mac and 3 Grade View: Grade I Tube type: Oral Tube size: 8.0 mm Number of attempts: 1 Placement Confirmation: ETT inserted through vocal cords under direct vision,  positive ETCO2 and breath sounds checked- equal and bilateral Secured at: 21 cm Tube secured with: Tape Dental Injury: Teeth and Oropharynx as per pre-operative assessment

## 2020-10-01 NOTE — Anesthesia Postprocedure Evaluation (Signed)
Anesthesia Post Note  Patient: Candice Ayala  Procedure(s) Performed: XI ROBOTIC ASSISTED TOTAL HYSTERECTOMY WITH UNILATERL  SALPINGO OOPHORECTOMY (N/A Abdomen) LYSIS OF ADHESION (N/A Abdomen)     Patient location during evaluation: PACU Anesthesia Type: General Level of consciousness: awake and alert Pain management: pain level controlled Vital Signs Assessment: post-procedure vital signs reviewed and stable Respiratory status: spontaneous breathing, nonlabored ventilation, respiratory function stable and patient connected to nasal cannula oxygen Cardiovascular status: blood pressure returned to baseline and stable Postop Assessment: no apparent nausea or vomiting Anesthetic complications: no   No complications documented.  Last Vitals:  Vitals:   10/01/20 1730 10/01/20 1745  BP: (!) 174/90 (!) 170/91  Pulse: (!) 59 64  Resp: (!) 21 (!) 21  Temp:  36.4 C  SpO2: 100% 96%    Last Pain:  Vitals:   10/01/20 1745  TempSrc:   PainSc: [name]

## 2020-10-01 NOTE — Progress Notes (Signed)
Left phone message for Candice Ayala (listed in patient contacts) that surgery went well and no cancer was found.  Thereasa Solo, MD'

## 2020-10-01 NOTE — Transfer of Care (Signed)
Immediate Anesthesia Transfer of Care Note  Patient: Candice Ayala  Procedure(s) Performed: Procedure(s): XI ROBOTIC ASSISTED TOTAL HYSTERECTOMY WITH UNILATERL  SALPINGO OOPHORECTOMY (N/A) LYSIS OF ADHESION (N/A)  Patient Location: PACU  Anesthesia Type:General  Level of Consciousness: Alert, Awake, Oriented  Airway & Oxygen Therapy: Patient Spontanous Breathing  Post-op Assessment: Report given to RN  Post vital signs: Reviewed and stable  Last Vitals:  Vitals:   10/01/20 1228  BP: (!) 170/99  Pulse: 79  Resp: 16  Temp: 37 C  SpO2: 397%    Complications: No apparent anesthesia complications

## 2020-10-01 NOTE — Op Note (Signed)
OPERATIVE NOTE 10/01/20  Surgeon: Donaciano Eva   Assistants: Dr Lahoma Crocker (an MD assistant was necessary for tissue manipulation, management of robotic instrumentation, retraction and positioning due to the complexity of the case and hospital policies).   Anesthesia: General endotracheal anesthesia  ASA Class: 3   Pre-operative Diagnosis: adnexal mass  Post-operative Diagnosis: left ovarian dermoid cyst  Operation: Robotic-assisted laparoscopic total hysterectomy with bilateral salpingoophorectomy   Surgeon: Donaciano Eva  Assistant Surgeon: Lahoma Crocker MD  Anesthesia: GET  Urine Output: 1000cc  Operative Findings:  : 12cm complex left ovarian cyst (smooth walled) containing sebaceous material, dermoid on frozen section. Fibroid uterus weighing 192gm, surgically absent right ovary. Surgically absent appendix. Normal upper abdomen.  Estimated Blood Loss:  10cc      Total IV Fluids: 800 ml         Specimens: washings, uterus and cervix, left tube and ovary.         Complications:  None; patient tolerated the procedure well.         Disposition: PACU - hemodynamically stable.  Procedure Details  The patient was seen in the Holding Room. The risks, benefits, complications, treatment options, and expected outcomes were discussed with the patient.  The patient concurred with the proposed plan, giving informed consent.  The site of surgery properly noted/marked. The patient was identified as Candice Ayala and the procedure verified as a Robotic-assisted hysterectomy with bilateral salpingo oophorectomy. A Time Out was held and the above information confirmed.  After induction of anesthesia, the patient was draped and prepped in the usual sterile manner. Pt was placed in supine position after anesthesia and draped and prepped in the usual sterile manner. The abdominal drape was placed after the CholoraPrep had been allowed to dry for 3 minutes.  Her  arms were tucked to her side with all appropriate precautions.  The shoulders were stabilized with padded shoulder blocks applied to the acromium processes.  The patient was placed in the semi-lithotomy position in [place].  The perineum was prepped with Betadine. The patient was then prepped. Foley catheter was placed.  A sterile speculum was placed in the vagina.  The cervix was grasped with a single-tooth tenaculum and dilated with Kennon Rounds dilators.  The ZUMI uterine manipulator with a medium colpotomizer ring was placed without difficulty.  A pneum occluder balloon was placed over the manipulator.  OG tube placement was confirmed and to suction.   Next, a 5 mm skin incision was made 1 cm below the subcostal margin in the midclavicular line.  The 5 mm Optiview port and scope was used for direct entry.  Opening pressure was under 10 mm CO2.  The abdomen was insufflated and the findings were noted as above.   At this point and all points during the procedure, the patient's intra-abdominal pressure did not exceed 15 mmHg. Next, a 10 mm skin incision was made above the umbilicus in the site of a previous incision and a right and left port was placed about 10 cm lateral to the robot port on the right and left side.  A fourth arm was placed in the left lower quadrant 2 cm above and superior and medial to the anterior superior iliac spine.  All ports were placed under direct visualization.  The patient was placed in steep Trendelenburg.  Bowel was folded away into the upper abdomen.  The robot was docked in the normal manner.  The hysterectomy was started after the round ligament on  the right side was incised and the retroperitoneum was entered and the pararectal space was developed.  The ureter was noted to be on the medial leaf of the broad ligament.  The peritoneum above the ureter was incised and stretched and the infundibulopelvic ligament was skeletonized, cauterized and cut.  The posterior peritoneum was  taken down to the level of the KOH ring.  The anterior peritoneum was also taken down.  The bladder flap was created to the level of the KOH ring.  The uterine artery on the right side was skeletonized, cauterized and cut in the normal manner.  A similar procedure was performed on the left. However,the left adnexa, being bulky, was separated from the uterus and the ovary was placed in an endocatch bag for separate retrieval.  The colpotomy was made and the uterus, cervix, left ovary and tube were amputated and delivered through the vagina (the left ovary was decompressed within the bag via the vagina to facilitate removal, no intraperitoneal spillage).  Pedicles were inspected and excellent hemostasis was achieved.    The colpotomy at the vaginal cuff was closed with Vicryl on a CT1 needle in a running manner.  Irrigation was used and excellent hemostasis was achieved.  At this point in the procedure was completed.  Robotic instruments were removed under direct visulaization.  The robot was undocked. The 10 mm ports were closed with Vicryl on a UR-5 needle and the fascia was closed with 0 Vicryl on a UR-5 needle.  The skin was closed with 4-0 Vicryl in a subcuticular manner.  Dermabond was applied.  Sponge, lap and needle counts correct x 2.  The patient was taken to the recovery room in stable condition.  The vagina was swabbed with  minimal bleeding noted.   All instrument and needle counts were correct x  3.   The patient was transferred to the recovery room in a stable condition.  Donaciano Eva, MD

## 2020-10-02 ENCOUNTER — Telehealth: Payer: Self-pay

## 2020-10-02 ENCOUNTER — Encounter (HOSPITAL_COMMUNITY): Payer: Self-pay | Admitting: Gynecologic Oncology

## 2020-10-02 NOTE — Telephone Encounter (Signed)
ENCOUNTER OPENED IN ERROR

## 2020-10-02 NOTE — Telephone Encounter (Signed)
Ms Hasegawa states that she is eating,drinking, and urinating well.  She had some nausea this am but is decreasing as the morning progresses. She has not passed gas. She feels like she needs to but it is not coming out. She experiences increased abdominal pain when she is tolieting. She will begin using Miralax 1 capful bid until she has a good BM.  Told her to call the office if she experiences an increase in nausea and vomiting and or increased abdominal pain as her bowel may not be waking up from surgery. Afebrile. Incisions are D&I. Pain currently controled with Ibuprofen and tylenol. She is aware of her post op appointments as well as the office number [phone] or after hours number [phone] to call if she has any questions or concerns.

## 2020-10-03 LAB — CYTOLOGY - NON PAP

## 2020-10-05 ENCOUNTER — Other Ambulatory Visit: Payer: Self-pay | Admitting: Nurse Practitioner

## 2020-10-07 ENCOUNTER — Telehealth: Payer: Self-pay

## 2020-10-07 NOTE — Telephone Encounter (Addendum)
Ms Fuhrmann states that she feels rectal pressure when she urinates. Pressure is 3-4/10. Once she stands up. She is fine. She experiences this pressure intermittently when she is sitting as well. Bowels have moved well x 3. No pain/pressure in rectum with BM. Afebrile.  Abdominal pain controled with intermittent ibuprofen. Will review with Dr. Denman George.

## 2020-10-07 NOTE — Telephone Encounter (Signed)
Told Candice Ayala that Dr. Denman George said that this pressure is normal post operatively and will gradually decrease as you heal from surgery. Pt verbalized understanding.

## 2020-10-14 ENCOUNTER — Other Ambulatory Visit: Payer: Self-pay | Admitting: Oncology

## 2020-10-14 LAB — SURGICAL PATHOLOGY

## 2020-10-14 NOTE — Progress Notes (Signed)
Gynecologic Oncology Multi-Disciplinary Disposition Conference Note  Date of the Conference: 10/14/2020  Patient Name: Candice Ayala  Referring Provider: Laurin Coder, NP Primary GYN Oncologist: Dr. Denman George  Stage/Disposition:  Clinical stage 1. Disposition is for BRAF testing and ultrasound of the thyroid.  If testing is negative, then follow up with serial thyroglobulins.   This Multidisciplinary conference took place involving physicians from [place], [place], Radiation Oncology, Pathology, Radiology along with the Gynecologic Oncology Nurse Practitioner and RN.  Comprehensive assessment of the patient's malignancy, staging, need for surgery, chemotherapy, radiation therapy, and need for further testing were reviewed. Supportive measures, both inpatient and following discharge were also discussed. The recommended plan of care is documented. Greater than 35 minutes were spent correlating and coordinating this patient's care.

## 2020-10-15 ENCOUNTER — Telehealth: Payer: Self-pay | Admitting: *Deleted

## 2020-10-15 NOTE — Telephone Encounter (Signed)
Patient called and stated "I went to the bathroom last night, after I urinated and wiped there was a small peddle like thing on the tissue. I'm not sure if it is a kidney of gallbladder stone. Is this something I should be worried about?"

## 2020-10-15 NOTE — Telephone Encounter (Signed)
Told Ms Gutknecht that she just needs to monitor for more pebbles with urination.  She did not have any flank pain or pain with urination yesterday.  If she notices anymore pebbles and or begins to have severe flank pain or pain with urination or blood in urine to notify her PCP to be evaluated for possible kidney stones. Pt verbalized understanding.

## 2020-10-21 ENCOUNTER — Telehealth: Payer: Self-pay

## 2020-10-21 NOTE — Telephone Encounter (Signed)
LM for patient to call the office tomorrow morning to review meaningful use questions for appointment.

## 2020-10-22 ENCOUNTER — Inpatient Hospital Stay (HOSPITAL_BASED_OUTPATIENT_CLINIC_OR_DEPARTMENT_OTHER): Payer: Medicare Other | Admitting: Gynecologic Oncology

## 2020-10-22 ENCOUNTER — Other Ambulatory Visit: Payer: Self-pay

## 2020-10-22 ENCOUNTER — Inpatient Hospital Stay: Payer: Medicare Other

## 2020-10-22 ENCOUNTER — Encounter: Payer: Self-pay | Admitting: Gynecologic Oncology

## 2020-10-22 VITALS — BP 172/93 | HR 72 | Temp 97.4°F | Resp 18 | Wt 213.2 lb

## 2020-10-22 DIAGNOSIS — D271 Benign neoplasm of left ovary: Secondary | ICD-10-CM | POA: Diagnosis not present

## 2020-10-22 DIAGNOSIS — C562 Malignant neoplasm of left ovary: Secondary | ICD-10-CM

## 2020-10-22 DIAGNOSIS — Z90722 Acquired absence of ovaries, bilateral: Secondary | ICD-10-CM

## 2020-10-22 DIAGNOSIS — Z7189 Other specified counseling: Secondary | ICD-10-CM

## 2020-10-22 DIAGNOSIS — Z90710 Acquired absence of both cervix and uterus: Secondary | ICD-10-CM | POA: Diagnosis not present

## 2020-10-22 HISTORY — DX: Malignant neoplasm of left ovary: C56.2

## 2020-10-22 NOTE — Progress Notes (Signed)
Follow-up Note: Gyn-Onc  Consult was requested by Laurin Coder, NP for the evaluation of Candice Ayala 64 y.o. female  CC:  Chief Complaint  Patient presents with  . Dermoid cyst of ovary, left    Assessment/Plan:  Candice Ayala  is a 64 y.o.  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  Recommendation is for BRAF testing of the tumor (to determine if high or low risk), thyroid ultrasound (to ensure no synchronous sites) and thyroglobulin testing. If negative, she can assume annual thyroglobulin checks and pelvic exams with me for 5 years.   HPI: Candice Ayala is a 64 year old P0 who was seen in consultation at the request Marciano Sequin NP for evaluation of a left-sided ovarian mass and postmenopausal bleeding.  The patient reported noting postmenopausal bleeding in September 2021.  She has a history of receiving regular gynecologic care, and had had an examination within the prior year, however scheduled a symptom driven appointment with Laurin Coder, her provider, given this new symptom.  This evaluation was performed on September 09, 2020.  At that time a transvaginal ultrasound scan was performed which revealed multiple calcified fibroids noted with a uterus measuring 8 x 6 x 4.8 cm.  The endometrium measured 3.6 mm.  The left ovary contained a hypoechoic mass with posterior shadowing measuring 4.5 x 4.2 x 4 cm with peripheral blood flow noted.  It was consistent with possibly an endometrioma versus dermoid.  Ca1 25 was drawn on September 06, 2020 and was elevated at 96.5.  Patient had a history of a Pap smear performed in February 2020 which was normal with negative high-risk HPV testing.  She had a preoperative endometrial sampling that was benign.  Interval Hx:  On 10/01/20 she underwent a robotic assisted total hysterectomy with BSO. Intraoperative findings were significant for a 12 cm complex left ovarian cyst that was  smooth walled and contained sebaceous material.  It was removed intact and was consistent with a dermoid cyst on frozen section.  The uterus was fibroid containing and weighed approximately 192 g.  The right ovary was surgically absent.  There is a surgically absent appendix.  The peritoneal cavity was otherwise abnormal with no abnormalities or extraovarian lesion seen. Surgery was uncomplicated.  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  The papillary thyroid carcinoma component measured 1.8 cm in greatest dimension.  There was no ovarian surface involvement identified. Her tumor was determined to be low risk based on classic papillary architecture, no extra-ovarian extension and tumor <4cm. BRAF testing was requested.   Since surgery she has done well with no specific complaints.   Current Meds:  Outpatient Encounter Medications as of 10/22/2020  Medication Sig  . b complex vitamins capsule Take 1 capsule by mouth daily.  Marland Kitchen BENADRYL ALLERGY 25 MG capsule SMARTSIG:By Mouth  . cholecalciferol (VITAMIN D) 1000 UNITS tablet Take 1,000 Units by mouth daily.  . diphenhydrAMINE (BENADRYL) 50 MG tablet 50 mg of benadryl orally administered 1 hour prior to your CT scan. Take at 7 am on 09/23/20.  . fluticasone (FLONASE) 50 MCG/ACT nasal spray SPRAY 2 SPRAYS INTO EACH NOSTRIL EVERY DAY (Patient taking differently: Place 2 sprays into both nostrils daily as needed for allergies. )  . ibuprofen (ADVIL) 200 MG tablet Take 200 mg by mouth every 6 (six) hours as needed  for headache or moderate pain.  Marland Kitchen ibuprofen (ADVIL) 600 MG tablet Take 1 tablet (600 mg total) by mouth every 6 (six) hours as needed for moderate pain. For AFTER surgery only  . loratadine (CLARITIN) 10 MG tablet Take 10 mg by mouth daily as needed for allergies.  . Multiple Vitamins-Minerals (MULTIVITAMIN WITH MINERALS) tablet Take 1 tablet by mouth daily.  .  predniSONE (DELTASONE) 50 MG tablet Take three doses of 50 mg oral prednisone administered 13 (7pm on 10/31), 7 (1am in the morning of Nov 1) , and 1 hour (7am on Nov 1) prior to your CT scan  . traMADol (ULTRAM) 50 MG tablet Take 1 tablet (50 mg total) by mouth every 6 (six) hours as needed for severe pain. For AFTER surgery, do not take and drive  . valsartan-hydrochlorothiazide (DIOVAN-HCT) 160-12.5 MG tablet Take 1 tablet by mouth daily. Patient needs an appointment  . vitamin C (ASCORBIC ACID) 250 MG tablet Take 250 mg by mouth daily.   No facility-administered encounter medications on file as of 10/22/2020.    Allergy:  Allergies  Allergen Reactions  . Contrast Media [Iodinated Diagnostic Agents] Hives    Social Hx:   Social History   Socioeconomic History  . Marital status: Divorced    Spouse name: Not on file  . Number of children: Not on file  . Years of education: Not on file  . Highest education level: Not on file  Occupational History  . Not on file  Tobacco Use  . Smoking status: Never Smoker  . Smokeless tobacco: Never Used  . Tobacco comment: She is a non-smoker  Vaping Use  . Vaping Use: Never used  Substance and Sexual Activity  . Alcohol use: No  . Drug use: No  . Sexual activity: Not Currently  Other Topics Concern  . Not on file  Social History Narrative  . Not on file   Social Determinants of Health   Financial Resource Strain:   . Difficulty of Paying Living Expenses: Not on file  Food Insecurity:   . Worried About Charity fundraiser in the Last Year: Not on file  . Ran Out of Food in the Last Year: Not on file  Transportation Needs:   . Lack of Transportation (Medical): Not on file  . Lack of Transportation (Non-Medical): Not on file  Physical Activity:   . Days of Exercise per Week: Not on file  . Minutes of Exercise per Session: Not on file  Stress:   . Feeling of Stress : Not on file  Social Connections:   . Frequency of Communication  with Friends and Family: Not on file  . Frequency of Social Gatherings with Friends and Family: Not on file  . Attends Religious Services: Not on file  . Active Member of Clubs or Organizations: Not on file  . Attends Archivist Meetings: Not on file  . Marital Status: Not on file  Intimate Partner Violence:   . Fear of Current or Ex-Partner: Not on file  . Emotionally Abused: Not on file  . Physically Abused: Not on file  . Sexually Abused: Not on file    Past Surgical Hx:  Past Surgical History:  Procedure Laterality Date  . BREAST BIOPSY Right 11/27/2013   Procedure: RIGHT BREAST BIOPSY WITH NEEDLE LOCALIZATION;  Surgeon: Odis Hollingshead, MD;  Location: [facility];  Service: General;  Laterality: Right;  . COLONOSCOPY    . JOINT REPLACEMENT  2004  right total hip  . LYSIS OF ADHESION N/A 10/01/2020   Procedure: LYSIS OF ADHESION;  Surgeon: Everitt Amber, MD;  Location: [facility];  Service: Gynecology;  Laterality: N/A;  . OVARIAN CYST REMOVAL  1980  . ROBOTIC ASSISTED TOTAL HYSTERECTOMY WITH BILATERAL SALPINGO OOPHERECTOMY N/A 10/01/2020   Procedure: XI ROBOTIC ASSISTED TOTAL HYSTERECTOMY WITH UNILATERL  SALPINGO OOPHORECTOMY;  Surgeon: Everitt Amber, MD;  Location: [facility];  Service: Gynecology;  Laterality: N/A;  . TOTAL HIP ARTHROPLASTY Right 2004    Past Medical Hx:  Past Medical History:  Diagnosis Date  . Allergy   . Arthritis   . Hypertension   . Malignant struma ovarii of left ovary ([place]) 10/22/2020  . PONV (postoperative nausea and vomiting)     Past Gynecological History:  See HPI No LMP recorded. Patient is postmenopausal.  Family Hx:  Family History  Problem Relation Age of Onset  . Cancer Mother        colon  . Early death Father   . Hypertension Brother   . Congestive Heart Failure Brother     Review of Systems:  Constitutional  Feels well,   ENT Normal appearing ears and nares bilaterally Skin/Breast  No rash, sores,  jaundice, itching, dryness Cardiovascular  No chest pain, shortness of breath, or edema  Pulmonary  No cough or wheeze.  Gastro Intestinal  No nausea, vomitting, or diarrhoea. No bright red blood per rectum, no abdominal pain, change in bowel movement, or constipation.  Genito Urinary  No frequency, urgency, dysuria, no bleeding Musculo Skeletal  No myalgia, arthralgia, joint swelling or pain  Neurologic  No weakness, numbness, change in gait,  Psychology  +  anxiety  Vitals:  Blood pressure (!) 172/93, pulse 72, temperature (!) 97.4 F (36.3 C), temperature source Tympanic, resp. rate 18, weight 213 lb 3.2 oz (96.7 kg), SpO2 100 %.  Physical Exam: WD in NAD Neck  Supple NROM, without any enlargements.  Lymph Node Survey No cervical supraclavicular or inguinal adenopathy Cardiovascular  Pulse normal rate, regularity and rhythm. S1 and S2 normal.  Lungs  Clear to auscultation bilateraly, without wheezes/crackles/rhonchi. Good air movement.  Skin  No rash/lesions/breakdown  Psychiatry  Alert and oriented to person, place, and time  Abdomen  Normoactive bowel sounds, abdomen soft, non-tender and obese without evidence of hernia.  Incisions well healed.  Back No CVA tenderness Genito Urinary  Well healed vaginal cuff, sutures in tact, no lesions, no masses Rectal  Good tone, no masses no cul de sac nodularity.  Extremities  No bilateral cyanosis, clubbing or edema.  30 minutes of direct face to face counseling time was spent with the patient. This included discussion about prognosis, therapy recommendations and postoperative side effects and are beyond the scope of routine postoperative care.    Thereasa Solo, MD  10/22/2020, 4:32 PM

## 2020-10-22 NOTE — Patient Instructions (Addendum)
Inside of the left ovary was a cyst called a dermoid cyst. This is a benign, noncancerous type of cyst. Dermoid cysts are made up of multiple types of tissue such as hair, skin, bone, and often thyroid tissue. In your dermoid cyst thyroid tissue was present. In this thyroid tissue within the dermoid cyst there were cancerous changes called papillary carcinoma. This is not the same as having ovarian cancer.  No additional treatment is recommended for this cancerous change of the ectopic thyroid tissue that was inside of the ovary. However, Dr. Denman George recommends an ultrasound of your thyroid to ensure that it is normal. She also recommends checking your thyroglobulin levels which she will do today. And these are recommended to be checked once a year.  Please contact Dr Serita Grit office (at [phone]) in July, 2022 to request an appointment with her for November, 2022. You will have your thyroglobulin level checked at that time and a pelvic exam.

## 2020-10-23 LAB — THYROGLOBULIN ANTIBODY: Thyroglobulin Antibody: <1 IU/mL (ref 0.0–0.9)

## 2020-10-25 ENCOUNTER — Telehealth: Payer: Self-pay

## 2020-10-25 NOTE — Telephone Encounter (Signed)
Told Candice Ayala that the lab was with in normal range.  Will let her know how the thyroid US looks when the results are back per Melissa Cross,NP. Pt verbalized understanding.

## 2020-10-28 ENCOUNTER — Ambulatory Visit (HOSPITAL_COMMUNITY)
Admission: RE | Admit: 2020-10-28 | Discharge: 2020-10-28 | Disposition: A | Discharge status: Home / Self Care | Payer: Medicare Other | Source: Ambulatory Visit | Attending: Gynecologic Oncology | Admitting: Gynecologic Oncology

## 2020-10-28 ENCOUNTER — Other Ambulatory Visit: Payer: Self-pay

## 2020-10-28 ENCOUNTER — Other Ambulatory Visit: Payer: Self-pay | Admitting: Gynecologic Oncology

## 2020-10-28 DIAGNOSIS — C562 Malignant neoplasm of left ovary: Secondary | ICD-10-CM | POA: Diagnosis not present

## 2020-10-28 DIAGNOSIS — E041 Nontoxic single thyroid nodule: Secondary | ICD-10-CM

## 2020-10-28 NOTE — Progress Notes (Signed)
Thyroid biopsy ordered due to thyroid US findings of a nodule.

## 2020-10-29 ENCOUNTER — Other Ambulatory Visit: Payer: Self-pay | Admitting: Gynecologic Oncology

## 2020-10-29 DIAGNOSIS — E041 Nontoxic single thyroid nodule: Secondary | ICD-10-CM

## 2020-10-31 ENCOUNTER — Other Ambulatory Visit: Payer: Self-pay | Admitting: Internal Medicine

## 2020-11-06 ENCOUNTER — Ambulatory Visit
Admission: RE | Admit: 2020-11-06 | Discharge: 2020-11-06 | Disposition: A | Discharge status: Home / Self Care | Payer: Medicare Other | Source: Ambulatory Visit | Attending: Gynecologic Oncology | Admitting: Gynecologic Oncology

## 2020-11-06 ENCOUNTER — Other Ambulatory Visit (HOSPITAL_COMMUNITY)
Admission: RE | Admit: 2020-11-06 | Discharge: 2020-11-06 | Disposition: A | Discharge status: Home / Self Care | Payer: Medicare Other | Source: Ambulatory Visit | Attending: Gynecologic Oncology | Admitting: Gynecologic Oncology

## 2020-11-06 DIAGNOSIS — D34 Benign neoplasm of thyroid gland: Secondary | ICD-10-CM | POA: Insufficient documentation

## 2020-11-06 DIAGNOSIS — E041 Nontoxic single thyroid nodule: Secondary | ICD-10-CM

## 2020-11-07 LAB — CYTOLOGY - NON PAP

## 2020-11-14 ENCOUNTER — Telehealth: Payer: Self-pay

## 2020-11-14 NOTE — Telephone Encounter (Signed)
Told Ms Wisler that the biopsy of the thyroid nodule was benign. No cancer per Melissa Cross,NP. Call in July of 2022 to schedule a lab appointment and visit with Dr. Denman George in November of 2022. Pt verbalized understanding.

## 2020-11-25 ENCOUNTER — Other Ambulatory Visit: Payer: Self-pay | Admitting: Internal Medicine

## 2020-12-22 ENCOUNTER — Other Ambulatory Visit: Payer: Self-pay | Admitting: Internal Medicine

## 2020-12-26 ENCOUNTER — Ambulatory Visit: Payer: Medicare Other

## 2020-12-26 ENCOUNTER — Ambulatory Visit: Payer: Medicare Other | Admitting: Internal Medicine

## 2021-01-01 ENCOUNTER — Telehealth: Payer: Self-pay | Admitting: Internal Medicine

## 2021-01-01 NOTE — Telephone Encounter (Signed)
Left message for patient to call back and schedule Medicare Annual Wellness Visit (AWV) either virtually or in office.   Last  Welcome to medicare 12/19/19 please schedule at anytime with [facility]    This should be a 45 minute visit.

## 2021-01-18 ENCOUNTER — Other Ambulatory Visit: Payer: Self-pay | Admitting: Internal Medicine

## 2021-01-24 ENCOUNTER — Ambulatory Visit (INDEPENDENT_AMBULATORY_CARE_PROVIDER_SITE_OTHER): Payer: Medicare Other | Admitting: Nurse Practitioner

## 2021-01-24 ENCOUNTER — Other Ambulatory Visit: Payer: Self-pay

## 2021-01-24 ENCOUNTER — Encounter: Payer: Self-pay | Admitting: Nurse Practitioner

## 2021-01-24 VITALS — BP 138/70 | HR 70 | Temp 98.1°F | Ht 66.0 in | Wt 213.2 lb

## 2021-01-24 DIAGNOSIS — J0100 Acute maxillary sinusitis, unspecified: Secondary | ICD-10-CM

## 2021-01-24 DIAGNOSIS — I10 Essential (primary) hypertension: Secondary | ICD-10-CM

## 2021-01-24 DIAGNOSIS — Z6836 Body mass index (BMI) 36.0-36.9, adult: Secondary | ICD-10-CM

## 2021-01-24 DIAGNOSIS — E6609 Other obesity due to excess calories: Secondary | ICD-10-CM

## 2021-01-24 MED ORDER — AMOXICILLIN-POT CLAVULANATE 875-125 MG PO TABS: 1.0000 | ORAL_TABLET | Freq: Two times a day (BID) | ORAL | 0 refills | Status: AC

## 2021-01-24 MED ORDER — VALSARTAN-HYDROCHLOROTHIAZIDE 160-12.5 MG PO TABS
1.0000 | ORAL_TABLET | Freq: Every day | ORAL | 0 refills | Status: DC
Start: 2021-01-24 — End: 2021-02-17

## 2021-01-24 NOTE — Progress Notes (Signed)
I,Tianna Badgett,acting as a Education administrator for [employer], NP.,have documented all relevant documentation on the behalf of [employer], NP,as directed by  Bary Castilla, NP while in the presence of Bary Castilla, NP.  This visit occurred during the SARS-CoV-2 public health emergency.  Safety protocols were in place, including screening questions prior to the visit, additional usage of staff PPE, and extensive cleaning of exam room while observing appropriate contact time as indicated for disinfecting solutions.  Subjective:     Patient ID: Candice Ayala , female    DOB: 10-19-1956 , 65 y.o.   MRN: [id number]   Chief Complaint  Patient presents with  . Hypertension    HPI  Patient is here for htn follow up. She is compliant with medications. No other concerns expect that she is experiencing sinus pressure today which she expereinces every year around this time with her allergies. She does not have a fever. She a a little bit of HA and congestion.    Hypertension This is a chronic problem. The current episode started more than 1 year ago. The problem is unchanged. The problem is controlled (fairly controlled). Associated symptoms include headaches. Pertinent negatives include no anxiety, chest pain, palpitations or shortness of breath. Risk factors for coronary artery disease include obesity.     Past Medical History:  Diagnosis Date  . Allergy   . Arthritis   . Hypertension   . Malignant struma ovarii of left ovary ([place]) 10/22/2020  . PONV (postoperative nausea and vomiting)      Family History  Problem Relation Age of Onset  . Cancer Mother        colon  . Early death Father   . Hypertension Brother   . Congestive Heart Failure Brother      Current Outpatient Medications:  .  amoxicillin-clavulanate (AUGMENTIN) 875-125 MG tablet, Take 1 tablet by mouth 2 (two) times daily for 7 days., Disp: 14 tablet, Rfl: 0 .  b complex vitamins capsule, Take 1 capsule by  mouth daily., Disp: , Rfl:  .  BENADRYL ALLERGY 25 MG capsule, SMARTSIG:By Mouth, Disp: , Rfl:  .  cholecalciferol (VITAMIN D) 1000 UNITS tablet, Take 1,000 Units by mouth daily., Disp: , Rfl:  .  diphenhydrAMINE (BENADRYL) 50 MG tablet, 50 mg of benadryl orally administered 1 hour prior to your CT scan. Take at 7 am on 09/23/20., Disp: 1 tablet, Rfl: 0 .  ibuprofen (ADVIL) 200 MG tablet, Take 200 mg by mouth every 6 (six) hours as needed for headache or moderate pain., Disp: , Rfl:  .  ibuprofen (ADVIL) 600 MG tablet, Take 1 tablet (600 mg total) by mouth every 6 (six) hours as needed for moderate pain. For AFTER surgery only, Disp: 30 tablet, Rfl: 0 .  loratadine (CLARITIN) 10 MG tablet, Take 10 mg by mouth daily as needed for allergies., Disp: , Rfl:  .  Multiple Vitamins-Minerals (MULTIVITAMIN WITH MINERALS) tablet, Take 1 tablet by mouth daily., Disp: , Rfl:  .  vitamin C (ASCORBIC ACID) 250 MG tablet, Take 250 mg by mouth daily., Disp: , Rfl:  .  fluticasone (FLONASE) 50 MCG/ACT nasal spray, SPRAY 2 SPRAYS INTO EACH NOSTRIL EVERY DAY (Patient taking differently: Place 2 sprays into both nostrils daily as needed for allergies. ), Disp: 48 mL, Rfl: 2 .  valsartan-hydrochlorothiazide (DIOVAN-HCT) 160-12.5 MG tablet, Take 1 tablet by mouth daily. Patient needs an appointment, Disp: 30 tablet, Rfl: 0   Allergies  Allergen Reactions  . Contrast Media [Iodinated  Diagnostic Agents] Hives     Review of Systems  Constitutional: Negative for chills, fatigue and fever.  HENT: Positive for congestion and sinus pressure. Negative for sneezing.   Respiratory: Negative for chest tightness, shortness of breath and wheezing.   Cardiovascular: Negative for chest pain and palpitations.  Gastrointestinal: Negative for constipation, diarrhea, nausea and vomiting.  Musculoskeletal: Negative for myalgias.  Neurological: Positive for headaches.     Today's Vitals   01/24/21 0929  BP: 138/70  Pulse: 70   Temp: 98.1 F (36.7 C)  Weight: 213 lb 3.2 oz (96.7 kg)  Height: 5\' 6"  (1.676 m)   Body mass index is 34.41 kg/m.  Wt Readings from Last 3 Encounters:  01/24/21 213 lb 3.2 oz (96.7 kg)  10/22/20 213 lb 3.2 oz (96.7 kg)  10/01/20 216 lb (98 kg)    Objective:  Physical Exam Constitutional:      Appearance: Normal appearance. [name]  HENT:     Head: Normocephalic and atraumatic.     Right Ear: Tympanic membrane normal.     Left Ear: Tympanic membrane normal.     Nose: Nose normal.  Cardiovascular:     Rate and Rhythm: Normal rate and regular rhythm.     Pulses: Normal pulses.     Heart sounds: Normal heart sounds. No murmur heard.   Pulmonary:     Effort: Pulmonary effort is normal. No respiratory distress.     Breath sounds: Normal breath sounds. No wheezing.  Musculoskeletal:     Cervical back: Normal range of motion and neck supple.  Skin:    General: Skin is warm and dry.     Capillary Refill: Capillary refill takes less than 2 seconds.  Neurological:     Mental Status: She is alert and oriented to person, place, and time.  Psychiatric:        Mood and Affect: Mood normal.        Behavior: Behavior normal.        Thought Content: Thought content normal.        Judgment: Judgment normal.         Assessment And Plan:     1. Essential hypertension -Chronic, Stable -Continue meds; refilled today  - valsartan-hydrochlorothiazide (DIOVAN-HCT) 160-12.5 MG tablet; Take 1 tablet by mouth daily. Patient needs an appointment  Dispense: 30 tablet; Refill: 0 -Educated patient about a heart healthy diet consisting of low fat and low sodium diet.   2. Acute maxillary sinusitis,recurrence not specified - amoxicillin-clavulanate (AUGM ENTIN) 875-125 MG tablet; Take 1 tablet by mouth 2 (two) times daily for 7 days.  Dispense: 14 tablet; Refill: 0 -Advised patient to continue to use her flonase as needed   3. Class 2 obesity due to excess calories without serious  comorbidity with body mass index (BMI) of 36.0 to 36.9 in adult  -Educated patient about a healthy diet consisting of low fat, low sugar diet. Exercise for 30-45 min. Daily.     Patient was given opportunity to ask questions. Patient verbalized understanding of the plan and was able to repeat key elements of the plan. All questions were answered to their satisfaction.  Bary Castilla, NP   I, Bary Castilla, NP, have reviewed all documentation for this visit. The documentation on 01/24/21 for the exam, diagnosis, procedures, and orders are all accurate and complete.  THE PATIENT IS ENCOURAGED TO PRACTICE SOCIAL DISTANCING DUE TO THE COVID-19 PANDEMIC.

## 2021-01-24 NOTE — Patient Instructions (Signed)

## 2021-01-27 ENCOUNTER — Ambulatory Visit: Payer: Medicare Other | Admitting: Nurse Practitioner

## 2021-02-15 ENCOUNTER — Other Ambulatory Visit: Payer: Self-pay | Admitting: Internal Medicine

## 2021-02-15 DIAGNOSIS — I10 Essential (primary) hypertension: Secondary | ICD-10-CM

## 2021-02-26 ENCOUNTER — Other Ambulatory Visit: Payer: Self-pay

## 2021-02-26 ENCOUNTER — Ambulatory Visit (INDEPENDENT_AMBULATORY_CARE_PROVIDER_SITE_OTHER): Payer: Medicare Other

## 2021-02-26 VITALS — BP 138/84 | HR 76 | Temp 97.6°F | Ht 64.6 in | Wt 215.0 lb

## 2021-02-26 DIAGNOSIS — Z23 Encounter for immunization: Secondary | ICD-10-CM | POA: Diagnosis not present

## 2021-02-26 DIAGNOSIS — Z0000 Encounter for general adult medical examination without abnormal findings: Secondary | ICD-10-CM | POA: Diagnosis not present

## 2021-02-26 MED ORDER — PREVNAR 20 0.5 ML IM SUSY: 0.5000 mL | PREFILLED_SYRINGE | INTRAMUSCULAR | 0 refills | Status: AC

## 2021-02-26 MED ORDER — BOOSTRIX 5-2.5-18.5 LF-MCG/0.5 IM SUSP: 0.5000 mL | Freq: Once | INTRAMUSCULAR | 0 refills | Status: AC

## 2021-02-26 NOTE — Progress Notes (Signed)
This visit occurred during the SARS-CoV-2 public health emergency.  Safety protocols were in place, including screening questions prior to the visit, additional usage of staff PPE, and extensive cleaning of exam room while observing appropriate contact time as indicated for disinfecting solutions.   Subjective:   Candice Ayala is a 65 y.o. female who presents for an Initial Medicare Annual Wellness Visit.  Review of Systems     Cardiac Risk Factors include: advanced age (>63men, >20 women);obesity (BMI >30kg/m2);sedentary lifestyle     Objective:    Today's Vitals   02/26/21 1125  BP: 138/84  Pulse: 76  Temp: 97.6 F (36.4 C)  TempSrc: Oral  SpO2: 97%  Weight: 215 lb (97.5 kg)  Height: 5' 4.6" (1.641 m)   Body mass index is 36.22 kg/m.  Advanced Directives 02/26/2021 10/22/2020 09/20/2020 09/16/2020 12/19/2019 11/07/2013  Does Patient Have a Medical Advance Directive? No No No No No Patient does not have advance directive;Patient would not like information  Would patient like information on creating a medical advance directive? No - Patient declined No - Patient declined - No - Patient declined Yes (ED - Information included in AVS) -    Current Medications (verified) Outpatient Encounter Medications as of 02/26/2021  Medication Sig  . b complex vitamins capsule Take 1 capsule by mouth daily.  Marland Kitchen BENADRYL ALLERGY 25 MG capsule SMARTSIG:By Mouth  . cholecalciferol (VITAMIN D) 1000 UNITS tablet Take 1,000 Units by mouth daily.  . fluticasone (FLONASE) 50 MCG/ACT nasal spray SPRAY 2 SPRAYS INTO EACH NOSTRIL EVERY DAY (Patient taking differently: Place 2 sprays into both nostrils daily as needed for allergies.)  . ibuprofen (ADVIL) 200 MG tablet Take 200 mg by mouth every 6 (six) hours as needed for headache or moderate pain.  Marland Kitchen ibuprofen (ADVIL) 600 MG tablet Take 1 tablet (600 mg total) by mouth every 6 (six) hours as needed for moderate pain. For AFTER surgery only  .  loratadine (CLARITIN) 10 MG tablet Take 10 mg by mouth daily as needed for allergies.  . Multiple Vitamins-Minerals (MULTIVITAMIN WITH MINERALS) tablet Take 1 tablet by mouth daily.  . valsartan-hydrochlorothiazide (DIOVAN-HCT) 160-12.5 MG tablet TAKE 1 TABLET BY MOUTH DAILY. PATIENT NEEDS AN APPOINTMENT  . vitamin C (ASCORBIC ACID) 250 MG tablet Take 250 mg by mouth daily.  . diphenhydrAMINE (BENADRYL) 50 MG tablet 50 mg of benadryl orally administered 1 hour prior to your CT scan. Take at 7 am on 09/23/20. (Patient not taking: Reported on 02/26/2021)   No facility-administered encounter medications on file as of 02/26/2021.    Allergies (verified) Contrast media [iodinated diagnostic agents]   History: Past Medical History:  Diagnosis Date  . Allergy   . Arthritis   . Hypertension   . 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 PONV (postoperative nausea and vomiting)    Past Surgical History:  Procedure Laterality Date  . BREAST BIOPSY Right 11/27/2013   Procedure: RIGHT BREAST BIOPSY WITH NEEDLE LOCALIZATION;  Surgeon: Odis Hollingshead, MD;  Location: [facility];  Service: General;  Laterality: Right;  . COLONOSCOPY    . JOINT REPLACEMENT  2004   right total hip  . LYSIS OF ADHESION N/A 10/01/2020   Procedure: LYSIS OF ADHESION;  Surgeon: Everitt Amber, MD;  Location: [facility];  Service: Gynecology;  Laterality: N/A;  . OVARIAN CYST REMOVAL  1980  . ROBOTIC ASSISTED TOTAL HYSTERECTOMY WITH BILATERAL SALPINGO OOPHERECTOMY N/A 10/01/2020   Procedure: XI ROBOTIC ASSISTED TOTAL  HYSTERECTOMY WITH UNILATERL  SALPINGO OOPHORECTOMY;  Surgeon: Everitt Amber, MD;  Location: [facility];  Service: Gynecology;  Laterality: N/A;  . TOTAL HIP ARTHROPLASTY Right 2004   Family History  Problem Relation Age of Onset  . Cancer Mother        colon  . Early death Father   . Hypertension Brother   . Congestive Heart Failure Brother    Social History   Socioeconomic History  .  Marital status: Divorced    Spouse name: Not on file  . Number of children: Not on file  . Years of education: Not on file  . Highest education level: Not on file  Occupational History  . Not on file  Tobacco Use  . Smoking status: Never Smoker  . Smokeless tobacco: Never Used  . Tobacco comment: She is a non-smoker  Vaping Use  . Vaping Use: Never used  Substance and Sexual Activity  . Alcohol use: No  . Drug use: No  . Sexual activity: Not Currently  Other Topics Concern  . Not on file  Social History Narrative  . Not on file   Social Determinants of Health   Financial Resource Strain: Low Risk   . Difficulty of Paying Living Expenses: Not hard at all  Food Insecurity: No Food Insecurity  . Worried About Charity fundraiser in the Last Year: Never true  . Ran Out of Food in the Last Year: Never true  Transportation Needs: No Transportation Needs  . Lack of Transportation (Medical): No  . Lack of Transportation (Non-Medical): No  Physical Activity: Inactive  . Days of Exercise per Week: 0 days  . Minutes of Exercise per Session: 0 min  Stress: No Stress Concern Present  . Feeling of Stress : Not at all  Social Connections: Not on file    Tobacco Counseling Counseling given: Not Answered Comment: She is a non-smoker   Clinical Intake:  Pre-visit preparation completed: Yes  Pain : No/denies pain     Nutritional Status: BMI > 30  Obese Nutritional Risks: None Diabetes: No  How often do you need to have someone help you when you read instructions, pamphlets, or other written materials from your doctor or pharmacy?: 1 - Never What is the last grade level you completed in school?: some college  Diabetic? no  Interpreter Needed?: No  Information entered by :: NAllen LPN   Activities of Daily Living In your present state of health, do you have any difficulty performing the following activities: 02/26/2021 09/20/2020  Hearing? N N  Vision? N N  Difficulty  concentrating or making decisions? N N  Walking or climbing stairs? N N  Dressing or bathing? N N  Doing errands, shopping? N N  Preparing Food and eating ? N -  Using the Toilet? N -  In the past six months, have you accidently leaked urine? Y -  Comment with coughing or sneezing -  Do you have problems with loss of bowel control? N -  Managing your Medications? N -  Managing your Finances? N -  Housekeeping or managing your Housekeeping? N -  Some recent data might be hidden    Patient Care Team: Glendale Chard, MD as PCP - General (Internal Medicine)  Indicate any recent Medical Services you may have received from other than [facility] providers in the past year (date may be approximate).     Assessment:   This is a routine wellness examination for Candice Ayala.  Hearing/Vision screen No  exam data present  Dietary issues and exercise activities discussed: Current Exercise Habits: The patient does not participate in regular exercise at present  Goals    . Exercise 150 min/wk Moderate Activity    . Patient Stated     02/26/2021, wants to lose 15 pounds and get BP under better control    . Weight (lb) < 200 lb (90.7 kg)      Depression Screen PHQ 2/9 Scores 02/26/2021 01/24/2021 12/19/2019  PHQ - 2 Score 0 0 0    Fall Risk Fall Risk  02/26/2021 01/24/2021 12/19/2019  Falls in the past year? 0 0 0  Number falls in past yr: - 0 -  Injury with Fall? - 0 -  Risk for fall due to : Medication side effect - -  Follow up Education provided;Falls prevention discussed;Falls evaluation completed - -    FALL RISK PREVENTION PERTAINING TO THE HOME:  Any stairs in or around the home? No  If so, are there any without handrails? n/a Home free of loose throw rugs in walkways, pet beds, electrical cords, etc? Yes  Adequate lighting in your home to reduce risk of falls? Yes   ASSISTIVE DEVICES UTILIZED TO PREVENT FALLS:  Life alert? No  Use of a cane, walker or w/c? No  Grab bars in the bathroom?  No  Shower chair or bench in shower? No  Elevated toilet seat or a handicapped toilet? No   TIMED UP AND GO:  Was the test performed? No .   Gait steady and fast without use of assistive device  Cognitive Function:     6CIT Screen 02/26/2021 12/19/2019  What Year? 0 points 0 points  What month? 0 points 0 points  What time? 0 points 0 points  Count back from 20 0 points 0 points  Months in reverse 0 points 0 points  Repeat phrase 0 points 0 points  Total Score 0 0    Immunizations Immunization History  Administered Date(s) Administered  . Fluad Quad(high Dose 65+) 07/31/2020  . Influenza,inj,Quad PF,6+ Mos 10/03/2018  . Influenza-Unspecified 08/22/2019  . PFIZER(Purple Top)SARS-COV-2 Vaccination 12/14/2019, 01/04/2020  . Pneumococcal Polysaccharide-23 08/22/2019    TDAP status: Due, Education has been provided regarding the importance of this vaccine. Advised may receive this vaccine at local pharmacy or Health Dept. Aware to provide a copy of the vaccination record if obtained from local pharmacy or Health Dept. Verbalized acceptance and understanding.  Flu Vaccine status: Up to date  Pneumococcal vaccine status: Up to date  Covid-19 vaccine status: Completed vaccines  Qualifies for Shingles Vaccine? Yes   Zostavax completed No   Shingrix Completed?: No.    Education has been provided regarding the importance of this vaccine. Patient has been advised to call insurance company to determine out of pocket expense if they have not yet received this vaccine. Advised may also receive vaccine at local pharmacy or Health Dept. Verbalized acceptance and understanding.  Screening Tests Health Maintenance  Topic Date Due  . TETANUS/TDAP  Never done  . COVID-19 Vaccine (3 - Pfizer risk 4-dose series) 02/01/2020  . PNA vac Low Risk Adult (2 of 2 - PCV13) 08/21/2020  . INFLUENZA VACCINE  06/23/2021  . MAMMOGRAM  03/29/2022  . COLONOSCOPY (Pts 45-39yrs Insurance coverage will need  to be confirmed)  09/04/2026  . DEXA SCAN  Completed  . Hepatitis C Screening  Completed  . HPV VACCINES  Aged Out    Health Maintenance  Health Maintenance Due  Topic Date Due  . TETANUS/TDAP  Never done  . COVID-19 Vaccine (3 - Pfizer risk 4-dose series) 02/01/2020  . PNA vac Low Risk Adult (2 of 2 - PCV13) 08/21/2020    Colorectal cancer screening: Type of screening: Colonoscopy. Completed 09/04/2016. Repeat every 10 years  Mammogram status: Completed 03/29/2020. Repeat every year  Bone Density status: Completed 03/29/2020.   Lung Cancer Screening: (Low Dose CT Chest recommended if Age 87-80 years, 30 pack-year currently smoking OR have quit w/in 15years.) does not qualify.   Lung Cancer Screening Referral: no  Additional Screening:  Hepatitis C Screening: does qualify; Completed 06/30/2016  Vision Screening: Recommended annual ophthalmology exams for early detection of glaucoma and other disorders of the eye. Is the patient up to date with their annual eye exam?  No  Who is the provider or what is the name of the office in which the patient attends annual eye exams? Don't remember If pt is not established with a provider, would they like to be referred to a provider to establish care? No .   Dental Screening: Recommended annual dental exams for proper oral hygiene  Community Resource Referral / Chronic Care Management: CRR required this visit?  No   CCM required this visit?  No      Plan:     I have personally reviewed and noted the following in the patient's chart:   . Medical and social history . Use of alcohol, tobacco or illicit drugs  . Current medications and supplements . Functional ability and status . Nutritional status . Physical activity . Advanced directives . List of other physicians . Hospitalizations, surgeries, and ER visits in previous 12 months . Vitals . Screenings to include cognitive, depression, and falls . Referrals and  appointments  In addition, I have reviewed and discussed with patient certain preventive protocols, quality metrics, and best practice recommendations. A written personalized care plan for preventive services as well as general preventive health recommendations were provided to patient.     Kellie Simmering, LPN   06/30/8675   Nurse Notes:

## 2021-02-26 NOTE — Patient Instructions (Signed)
Candice Ayala , Thank you for taking time to come for your Medicare Wellness Visit. I appreciate your ongoing commitment to your health goals. Please review the following plan we discussed and let me know if I can assist you in the future.   Screening recommendations/referrals: Colonoscopy: completed 09/04/2016 Mammogram: completed 03/29/2020 Bone Density: completed 03/29/2020 Recommended yearly ophthalmology/optometry visit for glaucoma screening and checkup Recommended yearly dental visit for hygiene and checkup  Vaccinations: Influenza vaccine: completed 07/31/2020, due 06/23/2021 Pneumococcal vaccine: sent to pharmacy Tdap vaccine: sent to pharmacy Shingles vaccine: discussed   Covid-19: 08/30/2020, 01/04/2020, 12/14/2019  Advanced directives: Advance directive discussed with you today. Even though you declined this today please call our office should you change your mind and we can give you the proper paperwork for you to fill out.  Conditions/risks identified: none  Next appointment: Follow up in one year for your annual wellness visit    Preventive Care 65 Years and Older, Female Preventive care refers to lifestyle choices and visits with your health care provider that can promote health and wellness. What does preventive care include?  A yearly physical exam. This is also called an annual well check.  Dental exams once or twice a year.  Routine eye exams. Ask your health care provider how often you should have your eyes checked.  Personal lifestyle choices, including:  Daily care of your teeth and gums.  Regular physical activity.  Eating a healthy diet.  Avoiding tobacco and drug use.  Limiting alcohol use.  Practicing safe sex.  Taking low-dose aspirin every day.  Taking vitamin and mineral supplements as recommended by your health care provider. What happens during an annual well check? The services and screenings done by your health care provider during your annual  well check will depend on your age, overall health, lifestyle risk factors, and family history of disease. Counseling  Your health care provider may ask you questions about your:  Alcohol use.  Tobacco use.  Drug use.  Emotional well-being.  Home and relationship well-being.  Sexual activity.  Eating habits.  History of falls.  Memory and ability to understand (cognition).  Work and work Statistician.  Reproductive health. Screening  You may have the following tests or measurements:  Height, weight, and BMI.  Blood pressure.  Lipid and cholesterol levels. These may be checked every 5 years, or more frequently if you are over 26 years old.  Skin check.  Lung cancer screening. You may have this screening every year starting at age 22 if you have a 30-pack-year history of smoking and currently smoke or have quit within the past 15 years.  Fecal occult blood test (FOBT) of the stool. You may have this test every year starting at age 64.  Flexible sigmoidoscopy or colonoscopy. You may have a sigmoidoscopy every 5 years or a colonoscopy every 10 years starting at age 52.  Hepatitis C blood test.  Hepatitis B blood test.  Sexually transmitted disease (STD) testing.  Diabetes screening. This is done by checking your blood sugar (glucose) after you have not eaten for a while (fasting). You may have this done every 1-3 years.  Bone density scan. This is done to screen for osteoporosis. You may have this done starting at age 73.  Mammogram. This may be done every 1-2 years. Talk to your health care provider about how often you should have regular mammograms. Talk with your health care provider about your test results, treatment options, and if necessary, the need for  more tests. Vaccines  Your health care provider may recommend certain vaccines, such as:  Influenza vaccine. This is recommended every year.  Tetanus, diphtheria, and acellular pertussis (Tdap, Td) vaccine.  You may need a Td booster every 10 years.  Zoster vaccine. You may need this after age 39.  Pneumococcal 13-valent conjugate (PCV13) vaccine. One dose is recommended after age 35.  Pneumococcal polysaccharide (PPSV23) vaccine. One dose is recommended after age 74. Talk to your health care provider about which screenings and vaccines you need and how often you need them. This information is not intended to replace advice given to you by your health care provider. Make sure you discuss any questions you have with your health care provider. Document Released: 12/06/2015 Document Revised: 07/29/2016 Document Reviewed: 09/10/2015 Elsevier Interactive Patient Education  2017 [place] Prevention in the Home Falls can cause injuries. They can happen to people of all ages. There are many things you can do to make your home safe and to help prevent falls. What can I do on the outside of my home?  Regularly fix the edges of walkways and driveways and fix any cracks.  Remove anything that might make you trip as you walk through a door, such as a raised step or threshold.  Trim any bushes or trees on the path to your home.  Use bright outdoor lighting.  Clear any walking paths of anything that might make someone trip, such as rocks or tools.  Regularly check to see if handrails are loose or broken. Make sure that both sides of any steps have handrails.  Any raised decks and porches should have guardrails on the edges.  Have any leaves, snow, or ice cleared regularly.  Use sand or salt on walking paths during winter.  Clean up any spills in your garage right away. This includes oil or grease spills. What can I do in the bathroom?  Use night lights.  Install grab bars by the toilet and in the tub and shower. Do not use towel bars as grab bars.  Use non-skid mats or decals in the tub or shower.  If you need to sit down in the shower, use a plastic, non-slip stool.  Keep the  floor dry. Clean up any water that spills on the floor as soon as it happens.  Remove soap buildup in the tub or shower regularly.  Attach bath mats securely with double-sided non-slip rug tape.  Do not have throw rugs and other things on the floor that can make you trip. What can I do in the bedroom?  Use night lights.  Make sure that you have a light by your bed that is easy to reach.  Do not use any sheets or blankets that are too big for your bed. They should not hang down onto the floor.  Have a firm chair that has side arms. You can use this for support while you get dressed.  Do not have throw rugs and other things on the floor that can make you trip. What can I do in the kitchen?  Clean up any spills right away.  Avoid walking on wet floors.  Keep items that you use a lot in easy-to-reach places.  If you need to reach something above you, use a strong step stool that has a grab bar.  Keep electrical cords out of the way.  Do not use floor polish or wax that makes floors slippery. If you must use wax, use non-skid  floor wax.  Do not have throw rugs and other things on the floor that can make you trip. What can I do with my stairs?  Do not leave any items on the stairs.  Make sure that there are handrails on both sides of the stairs and use them. Fix handrails that are broken or loose. Make sure that handrails are as long as the stairways.  Check any carpeting to make sure that it is firmly attached to the stairs. Fix any carpet that is loose or worn.  Avoid having throw rugs at the top or bottom of the stairs. If you do have throw rugs, attach them to the floor with carpet tape.  Make sure that you have a light switch at the top of the stairs and the bottom of the stairs. If you do not have them, ask someone to add them for you. What else can I do to help prevent falls?  Wear shoes that:  Do not have high heels.  Have rubber bottoms.  Are comfortable and fit  you well.  Are closed at the toe. Do not wear sandals.  If you use a stepladder:  Make sure that it is fully opened. Do not climb a closed stepladder.  Make sure that both sides of the stepladder are locked into place.  Ask someone to hold it for you, if possible.  Clearly mark and make sure that you can see:  Any grab bars or handrails.  First and last steps.  Where the edge of each step is.  Use tools that help you move around (mobility aids) if they are needed. These include:  Canes.  Walkers.  Scooters.  Crutches.  Turn on the lights when you go into a dark area. Replace any light bulbs as soon as they burn out.  Set up your furniture so you have a clear path. Avoid moving your furniture around.  If any of your floors are uneven, fix them.  If there are any pets around you, be aware of where they are.  Review your medicines with your doctor. Some medicines can make you feel dizzy. This can increase your chance of falling. Ask your doctor what other things that you can do to help prevent falls. This information is not intended to replace advice given to you by your health care provider. Make sure you discuss any questions you have with your health care provider. Document Released: 09/05/2009 Document Revised: 04/16/2016 Document Reviewed: 12/14/2014 Elsevier Interactive Patient Education  2017 [employer].

## 2021-02-27 ENCOUNTER — Telehealth: Payer: Self-pay | Admitting: *Deleted

## 2021-02-27 NOTE — Telephone Encounter (Signed)
Patient called and stated "Dr Denman George did my surgery. Is there anything I can take for these hot flashes. Something over the counter."

## 2021-02-28 ENCOUNTER — Telehealth: Payer: Self-pay | Admitting: *Deleted

## 2021-02-28 NOTE — Telephone Encounter (Signed)
Returned call to patient regarding her hot flashes since surgery in November.  Patient states the hot flashes started in January and became much worse in March, she is having trouble sleeping and they are very bothersome to her.  She would prefer to take something over the counter, has been taking black cohash but "it doesn't seem to be working."  Per M. Cross NP patient advised to look up "over the counter medications for hot flashes," there are some herbal supplements which could be helpful.  Patient also advised to avoid caffeine, hot drinks and hot spicy foods.  Also advised patient to call back if these products are not working, [phone].  Patient verbalizes understanding.

## 2021-03-10 ENCOUNTER — Telehealth: Payer: Self-pay

## 2021-03-10 ENCOUNTER — Telehealth (INDEPENDENT_AMBULATORY_CARE_PROVIDER_SITE_OTHER): Payer: Medicare Other | Admitting: Nurse Practitioner

## 2021-03-10 VITALS — Temp 97.5°F

## 2021-03-10 DIAGNOSIS — R059 Cough, unspecified: Secondary | ICD-10-CM

## 2021-03-10 DIAGNOSIS — R0981 Nasal congestion: Secondary | ICD-10-CM

## 2021-03-10 MED ORDER — BENZONATATE 100 MG PO CAPS
100.0000 mg | ORAL_CAPSULE | Freq: Three times a day (TID) | ORAL | 0 refills | Status: AC | PRN
Start: 2021-03-10 — End: 2022-03-10

## 2021-03-10 MED ORDER — AMOXICILLIN-POT CLAVULANATE 875-125 MG PO TABS
1.0000 | ORAL_TABLET | Freq: Two times a day (BID) | ORAL | 0 refills | Status: AC
Start: 2021-03-10 — End: 2021-03-17

## 2021-03-10 NOTE — Progress Notes (Signed)
Virtual Visit via phone due to failed video.    This visit type was conducted due to national recommendations for restrictions regarding the COVID-19 Pandemic (e.g. social distancing) in an effort to limit this patient's exposure and mitigate transmission in our community.  Due to her co-morbid illnesses, this patient is at least at moderate risk for complications without adequate follow up.  This format is felt to be most appropriate for this patient at this time.  All issues noted in this document were discussed and addressed.  A limited physical exam was performed with this format.    This visit type was conducted due to national recommendations for restrictions regarding the COVID-19 Pandemic (e.g. social distancing) in an effort to limit this patient's exposure and mitigate transmission in our community.  Patients identity confirmed using two different identifiers.  This format is felt to be most appropriate for this patient at this time.  All issues noted in this document were discussed and addressed.  No physical exam was performed (except for noted visual exam findings with Video Visits).    Date:  03/10/2021   ID:  KESTREL MIS, DOB [birth year], MRN [id number]  Patient Location: home  Provider location:   Office    Chief Complaint:  Sinus congestion   History of Present Illness:    Candice Ayala is a 65 y.o. female who presents via video conferencing for a telehealth visit today.    The patient has cough, congestion, HA, post nasal drip   Conducted this visit via phone call due to failed video call. She started feeling sick last week but since then she is feeling little better. She still has a headache. Her appetite has returned. She is taking mucinex which is helping her. She is coughing up mucus. Her mucus was yellow yesterday and today it was normal. No fever. She has a ear ache in her right ear. She states she does have some tenderness near her nose when she presses.  She does have some congestion and post nasal drip.  She does not have any SOB. No chest pain.    Past Medical History:  Diagnosis Date  . Allergy   . Arthritis   . Hypertension   . 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 PONV (postoperative nausea and vomiting)    Past Surgical History:  Procedure Laterality Date  . BREAST BIOPSY Right 11/27/2013   Procedure: RIGHT BREAST BIOPSY WITH NEEDLE LOCALIZATION;  Surgeon: Odis Hollingshead, MD;  Location: [facility];  Service: General;  Laterality: Right;  . COLONOSCOPY    . JOINT REPLACEMENT  2004   right total hip  . LYSIS OF ADHESION N/A 10/01/2020   Procedure: LYSIS OF ADHESION;  Surgeon: Everitt Amber, MD;  Location: [facility];  Service: Gynecology;  Laterality: N/A;  . OVARIAN CYST REMOVAL  1980  . ROBOTIC ASSISTED TOTAL HYSTERECTOMY WITH BILATERAL SALPINGO OOPHERECTOMY N/A 10/01/2020   Procedure: XI ROBOTIC ASSISTED TOTAL HYSTERECTOMY WITH UNILATERL  SALPINGO OOPHORECTOMY;  Surgeon: Everitt Amber, MD;  Location: [facility];  Service: Gynecology;  Laterality: N/A;  . TOTAL HIP ARTHROPLASTY Right 2004     Current Meds  Medication Sig  . amoxicillin-clavulanate (AUGMENTIN) 875-125 MG tablet Take 1 tablet by mouth 2 (two) times daily for 7 days.  . benzonatate (TESSALON PERLES) 100 MG capsule Take 1 capsule (100 mg total) by mouth 3 (three) times daily as needed for cough.     Allergies:   Contrast  media [iodinated diagnostic agents]   Social History   Tobacco Use  . Smoking status: Never Smoker  . Smokeless tobacco: Never Used  . Tobacco comment: She is a non-smoker  Vaping Use  . Vaping Use: Never used  Substance Use Topics  . Alcohol use: No  . Drug use: No     Family Hx: The patient's family history includes Cancer in her mother; Congestive Heart Failure in her brother; Early death in her father; Hypertension in her brother.  ROS:   Please see the history of present illness.    Review of Systems   Constitutional: Negative for chills and fever.  HENT: Positive for congestion, ear pain and sinus pain. Negative for ear discharge and sore throat.   Respiratory: Positive for cough.   Cardiovascular: Negative for chest pain.  Neurological: Positive for headaches.    All other systems reviewed and are negative.   Labs/Other Tests and Data Reviewed:    Recent Labs: 09/20/2020: ALT 17; BUN 13; Creatinine, Ser 0.80; Hemoglobin 12.6; Platelets 354; Potassium 4.6; Sodium 138   Recent Lipid Panel Lab Results  Component Value Date/Time   CHOL 250 (H) 12/19/2019 12:01 PM   TRIG 123 12/19/2019 12:01 PM   HDL 70 12/19/2019 12:01 PM   CHOLHDL 3.6 12/19/2019 12:01 PM   LDLCALC 158 (H) 12/19/2019 12:01 PM    Wt Readings from Last 3 Encounters:  02/26/21 215 lb (97.5 kg)  01/24/21 213 lb 3.2 oz (96.7 kg)  10/22/20 213 lb 3.2 oz (96.7 kg)     Exam:    Vital Signs:  Temp (!) 97.5 F (36.4 C) (Oral)     Physical Exam Vitals and nursing note reviewed.  Pulmonary:     Effort: Pulmonary effort is normal.  Neurological:     Mental Status: She is oriented to person, place, and time.  Psychiatric:        Mood and Affect: Affect normal.     ASSESSMENT & PLAN:     There are no diagnoses linked to this encounter.   1) Sinus congestion  -Due to patient having congestion and headache with complaints of tenderness near her sinus will go ahead and treat her with antx.  -Augmentin BID x 7 days sent to pharmacy  -Patient vaccinated with [place] patient to continue to use Mucinex as well as pain relievers as needed.  -Keep herself hydrated with plenty of water  -Instructed patient to rest, take vitamin C, D and zinc -Instructed patient if symptoms get worse to follow up   2) Cough  -Sent in prescription for Tessalon pearls to take TID PRN for cough  -Take OTC delsym as needed for cough  -Instructed patient to follow up if cough gets worse.   Follow up: as needed.     COVID-19 Education: The signs and symptoms of COVID-19 were discussed with the patient and how to seek care for testing (follow up with PCP or arrange E-visit).  The importance of social distancing was discussed today.  Patient Risk:   After full review of this patients clinical status, I feel that they are at least moderate risk at this time.  Time:   Today, I have spent 15 minutes/ seconds with the patient with telehealth technology discussing above diagnoses.     Medication Adjustments/Labs and Tests Ordered: Current medicines are reviewed at length with the patient today.  Concerns regarding medicines are outlined above.   Tests Ordered: No orders of the defined types were placed  in this encounter.   Medication Changes: Meds ordered this encounter  Medications  . amoxicillin-clavulanate (AUGMENTIN) 875-125 MG tablet    Sig: Take 1 tablet by mouth 2 (two) times daily for 7 days.    Dispense:  14 tablet    Refill:  0  . benzonatate (TESSALON PERLES) 100 MG capsule    Sig: Take 1 capsule (100 mg total) by mouth 3 (three) times daily as needed for cough.    Dispense:  30 capsule    Refill:  0    Disposition:  Follow up as needed.   Signed, Bary Castilla, NP

## 2021-03-10 NOTE — Telephone Encounter (Signed)
The pt agreed to a virtual appt for evaluation of sinus infection.  The pt was asked if she was tested for covid and she said no because she knows she doesn't have covid.  The pt was told that she has covid symptoms and that she should be tested for covid.

## 2021-03-10 NOTE — Patient Instructions (Signed)
Sinus Headache  A sinus headache occurs when your sinuses become clogged or swollen. Sinuses are air-filled spaces in your skull that are behind the bones of your face and forehead. Sinus headaches can range from mild to severe. What are the causes? A sinus headache can result from various conditions that affect the sinuses. Common causes include:  Colds.  Sinus infections.  Allergies. Many people confuse sinus headaches with migraines or tension headaches because both headaches can cause facial pain and nasal symptoms. What are the signs or symptoms? The main symptom of this condition is a headache that may feel like pain or pressure in your face, forehead, ears, or upper teeth. People who have a sinus headache often have other symptoms, such as:  Congested or runny nose.  Fever.  Inability to smell. Weather changes can make symptoms worse. How is this diagnosed? This condition may be diagnosed based on:  A physical exam and medical history.  Imaging tests, such as a CT scan or MRI, to check for problems with the sinuses.  Examination of the sinuses using a thin tool with a camera that is inserted through your nose (endoscopy). How is this treated? Treatment for this condition depends on the cause.  Sinus pain that is caused by a sinus infection may be treated with antibiotic medicine.  Sinus pain that is caused by congestion may be helped by rinsing out (flushing) the nose and sinuses with saline solution.  Sinus pain that is caused by allergies may be helped by allergy medicines (antihistamines) and medicated nasal sprays.  Sinus surgery may be needed in some cases if other treatments do not help. Follow these instructions at home: General instructions  If directed: ? Apply a warm, moist washcloth to your face to help relieve pain. ? Use a nasal saline wash. Hydrate and humidify  Drink enough water to keep your urine clear or pale yellow. Staying hydrated will help  to thin your mucus.  Use a cool mist humidifier to keep the humidity level in your home above 50%.  Inhale steam for 10-15 minutes, 3-4 times a day or as told by your health care provider. You can do this in the bathroom while a hot shower is running.  Limit your exposure to cool or dry air. Medicines  Take over-the-counter and prescription medicines only as told by your health care provider.  If you were prescribed an antibiotic medicine, take it as told by your health care provider. Do not stop taking the antibiotic even if you start to feel better.  If you have congestion, use a nasal spray to help lessen pressure.   Contact a health care provider if:  You have a headache more than one time a week.  You have sensitivity to light or sound.  You develop a fever.  You feel nauseous or you vomit.  Your headaches do not get better with treatment. Many people think that they have a sinus headache when they actually have a migraine or a tension headache. Get help right away if:  You have vision problems.  You have sudden, severe pain in your face or head.  You have a seizure.  You are confused.  You have a stiff neck. Summary  A sinus headache occurs when your sinuses become clogged or swollen.  A sinus headache can result from various conditions that affect the sinuses, such as a cold, a sinus infection, or an allergy.  Treatment for this condition depends on the cause. It may include  medicine, such as antibiotics or antihistamines. This information is not intended to replace advice given to you by your health care provider. Make sure you discuss any questions you have with your health care provider. Document Revised: 08/20/2020 Document Reviewed: 08/20/2020 Elsevier Patient Education  2021 [employer].

## 2021-03-13 ENCOUNTER — Other Ambulatory Visit: Payer: Self-pay | Admitting: Nurse Practitioner

## 2021-03-13 DIAGNOSIS — I10 Essential (primary) hypertension: Secondary | ICD-10-CM

## 2021-04-04 LAB — HM MAMMOGRAPHY

## 2021-04-07 ENCOUNTER — Encounter: Payer: Self-pay | Admitting: Internal Medicine

## 2021-04-08 ENCOUNTER — Other Ambulatory Visit: Payer: Self-pay | Admitting: Nurse Practitioner

## 2021-04-08 DIAGNOSIS — I10 Essential (primary) hypertension: Secondary | ICD-10-CM

## 2021-05-03 ENCOUNTER — Other Ambulatory Visit: Payer: Self-pay | Admitting: Nurse Practitioner

## 2021-05-03 DIAGNOSIS — I10 Essential (primary) hypertension: Secondary | ICD-10-CM

## 2021-06-03 ENCOUNTER — Other Ambulatory Visit: Payer: Self-pay | Admitting: Nurse Practitioner

## 2021-06-03 DIAGNOSIS — I10 Essential (primary) hypertension: Secondary | ICD-10-CM

## 2021-07-22 ENCOUNTER — Other Ambulatory Visit: Payer: Self-pay | Admitting: Nurse Practitioner

## 2021-07-22 DIAGNOSIS — I10 Essential (primary) hypertension: Secondary | ICD-10-CM

## 2021-08-22 IMAGING — CT CT ABD-PELV W/ CM
2 of 5 series · 14 of 46 positions shown, 16 images · IV contrast (OMNIPAQUE)
Comparison: Report of an abdominal MR 07/29/1999.

CLINICAL DATA: Left ovarian mass on ultrasound. Elevated CA 125.
vaginal bleeding. Postmenopausal. Hysterectomy planned for
10/01/2020.

EXAM:
CT ABDOMEN AND PELVIS WITH CONTRAST
TECHNIQUE: Multidetector CT imaging of the abdomen and pelvis was performed
using the standard protocol following bolus administration of
intravenous contrast.
CONTRAST:  100mL OMNIPAQUE IOHEXOL 300 MG/ML  SOLN

[Series 2: axial st · axial · 0.98mm/px · z∈[+1028,+1428]mm · 11 of 97 slices shown, 13 images]
[im 9/97  soft-tissue]
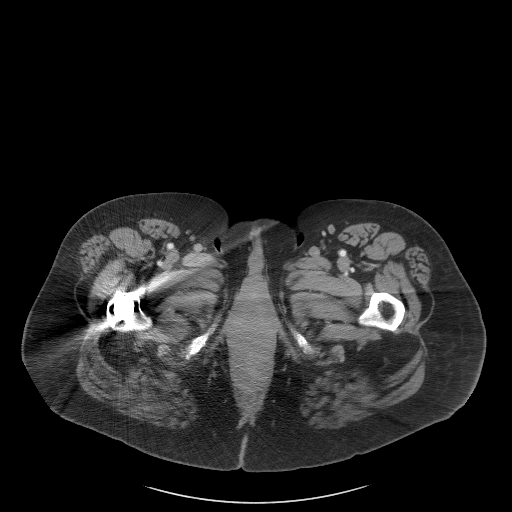
[im 9/97  bone]
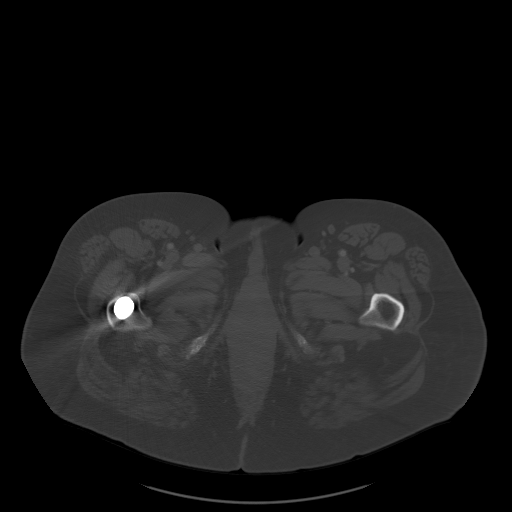
[im 17/97  soft-tissue]
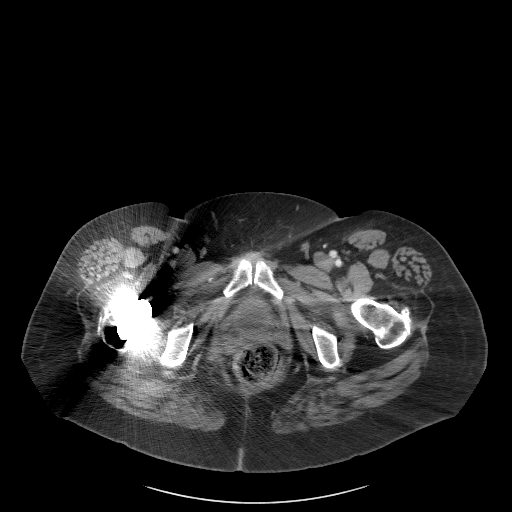
[im 25/97  soft-tissue]
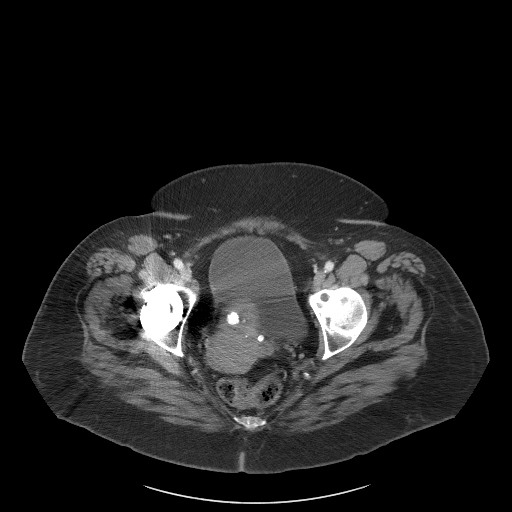
[im 33/97  soft-tissue]
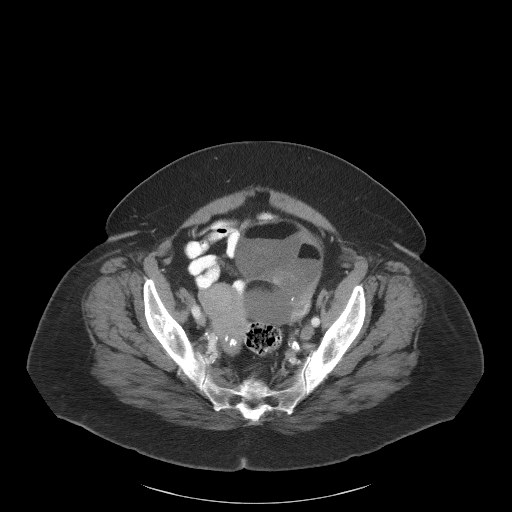
[im 41/97  soft-tissue]
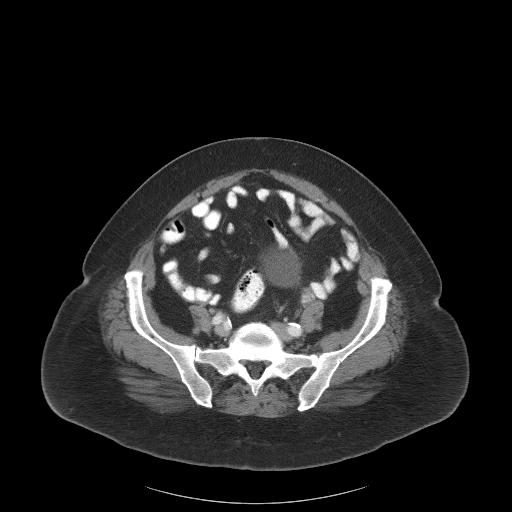
[im 49/97  soft-tissue]
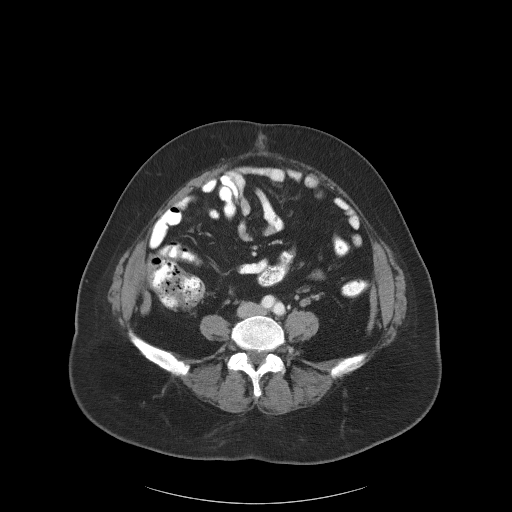
[im 57/97  soft-tissue]
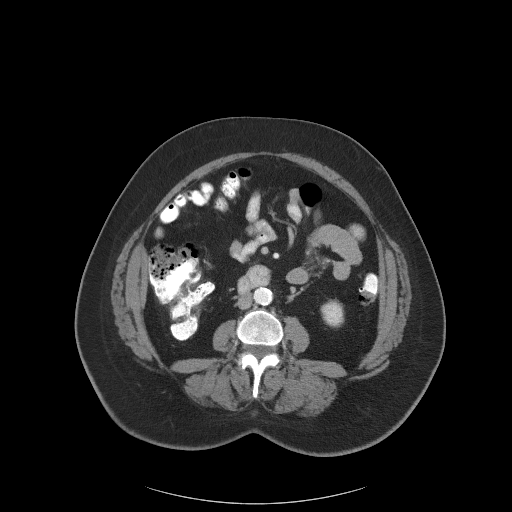
[im 65/97  soft-tissue]
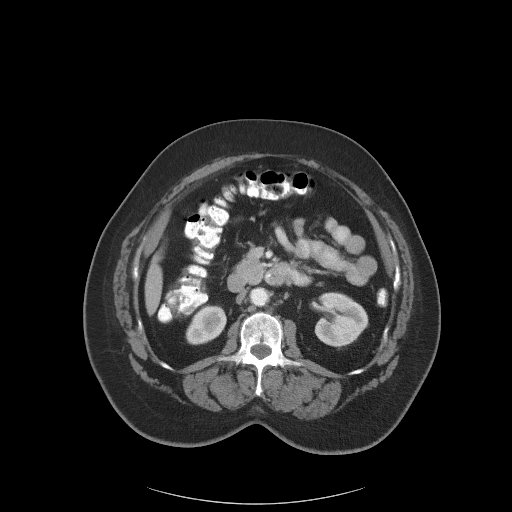
[im 73/97  soft-tissue]
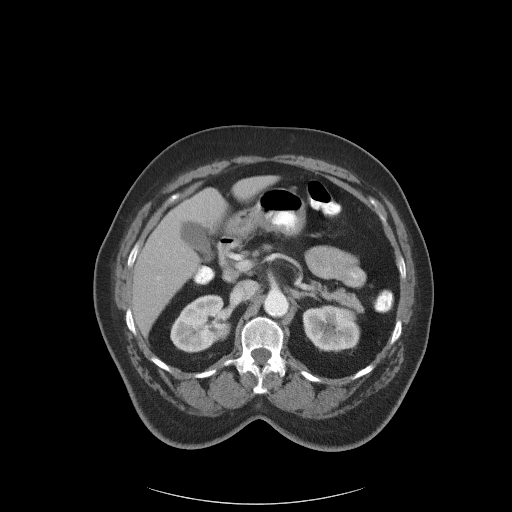
[im 73/97  bone]
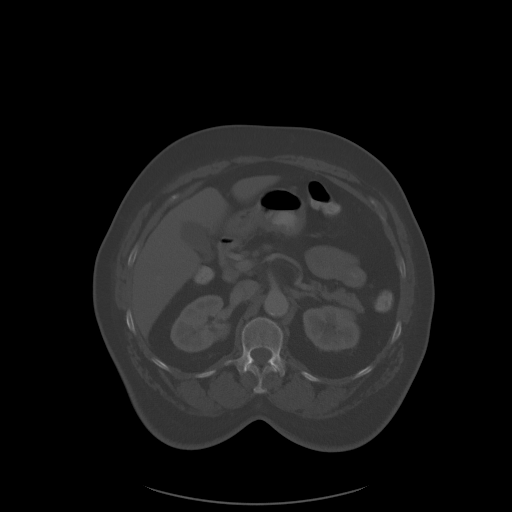
[im 81/97  soft-tissue]
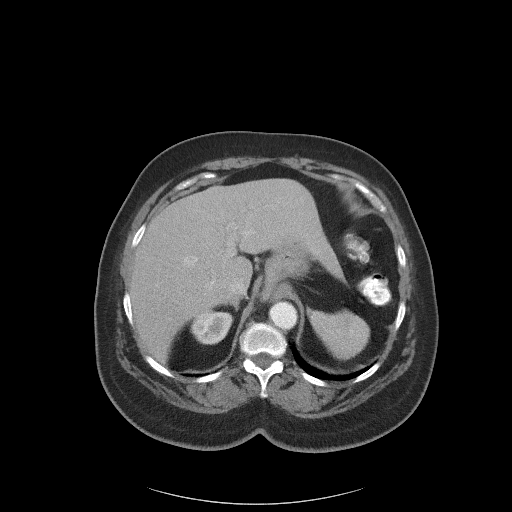
[im 89/97  soft-tissue]
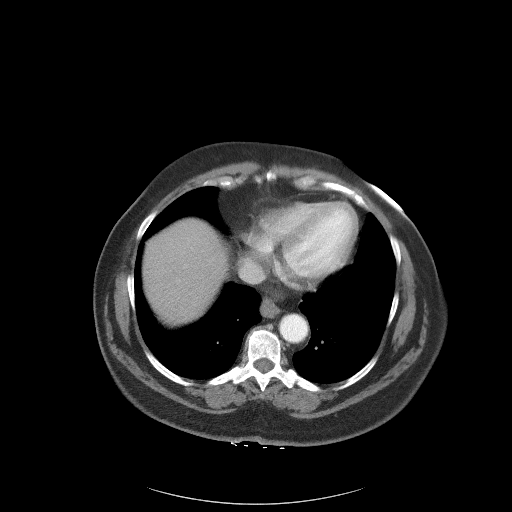

[Series 5: coronal st · coronal · 0.68mm/px · 3 of 120 slices shown]
[im 40/120  soft-tissue]
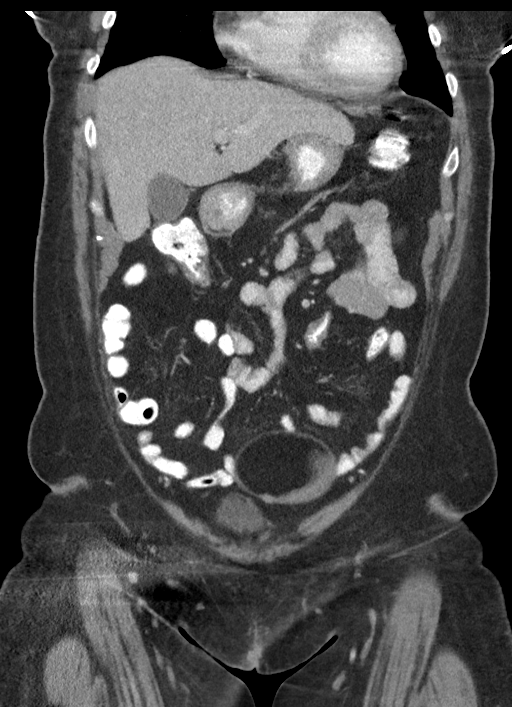
[im 53/120  soft-tissue]
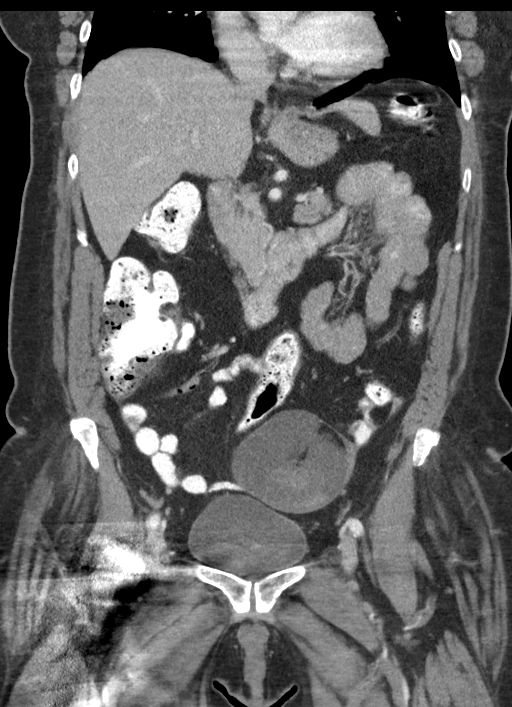
[im 67/120  soft-tissue]
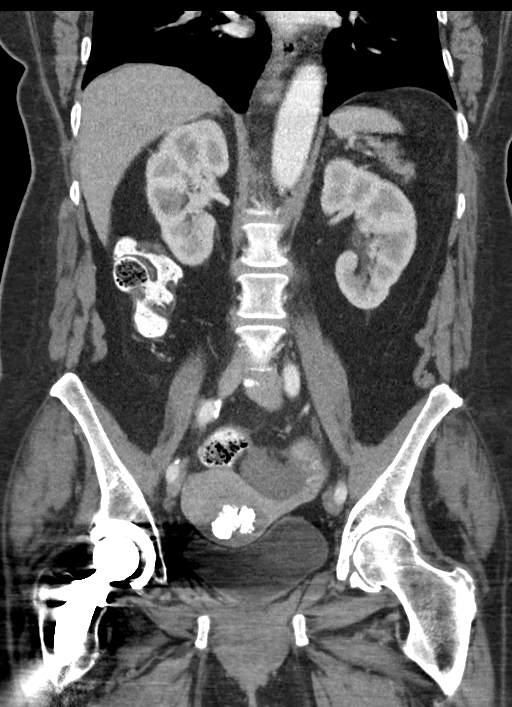

[14 of 46 positions shown; findings below may reference images not displayed]

FINDINGS: Lower chest: Clear lung bases. Normal heart size without pericardial
or pleural effusion. Small hiatal hernia.

Hepatobiliary: High left hepatic lobe subcentimeter cyst. Normal
gallbladder, without biliary ductal dilatation.

Pancreas: Normal, without mass or ductal dilatation.

Spleen: Normal in size, without focal abnormality.

Adrenals/Urinary Tract: Normal adrenal glands. Right renal fat
density lesions consistent with angiomyolipomas. The larger is at
1.3 cm in the upper pole . An inter/lower pole 9 mm lesion is also
identified. No hydronephrosis. Degraded evaluation of the pelvis,
secondary to beam hardening artifact from right hip arthroplasty.
Grossly normal urinary bladder.

Stomach/Bowel: Normal remainder of the stomach. Scattered colonic
diverticula. Normal terminal ileum and appendix. Normal small bowel.

Vascular/Lymphatic: Advanced aortic and branch vessel
atherosclerosis. No abdominopelvic adenopathy.

Reproductive: Fibroid uterus, with a fundal partially calcified mass
measuring 5.2 x 3.5 cm on 70/2.

No right ovarian/adnexal mass identified.

Left ovarian mass measures 10.3 x 8.7 by 6.8 cm. Demonstrates areas
of fat and ossific/calcific density within. Example 63/2 and image
67 sagittal.

Other: No significant free fluid.

Musculoskeletal: Right hip arthroplasty. Moderate left hip
osteoarthritis. Lumbosacral spondylosis.
IMPRESSION: 1. Dominant left ovarian/adnexal mass, consistent with a mature
teratoma/dermoid cyst.
2. Right renal angiomyolipomas, which can be seen with tuberous
sclerosis.
3. Fibroid uterus.
4. Small hiatal hernia.
5.  Aortic Atherosclerosis ([id number]).

## 2021-12-12 ENCOUNTER — Telehealth: Payer: Self-pay

## 2021-12-12 ENCOUNTER — Other Ambulatory Visit: Payer: Self-pay | Admitting: Nurse Practitioner

## 2021-12-12 DIAGNOSIS — I10 Essential (primary) hypertension: Secondary | ICD-10-CM

## 2021-12-12 NOTE — Telephone Encounter (Signed)
The pt was scheduled for an appt for a bp f/u and notified that she must keep the appt to receive future refills.

## 2022-01-05 ENCOUNTER — Encounter: Payer: Self-pay | Admitting: Internal Medicine

## 2022-01-05 ENCOUNTER — Other Ambulatory Visit: Payer: Self-pay

## 2022-01-05 ENCOUNTER — Ambulatory Visit (INDEPENDENT_AMBULATORY_CARE_PROVIDER_SITE_OTHER): Payer: Medicare Other | Admitting: Nurse Practitioner

## 2022-01-05 ENCOUNTER — Encounter: Payer: Self-pay | Admitting: Nurse Practitioner

## 2022-01-05 VITALS — BP 162/100 | HR 74 | Temp 98.1°F | Ht 64.0 in | Wt 217.0 lb

## 2022-01-05 DIAGNOSIS — I10 Essential (primary) hypertension: Secondary | ICD-10-CM | POA: Diagnosis not present

## 2022-01-05 DIAGNOSIS — Z6836 Body mass index (BMI) 36.0-36.9, adult: Secondary | ICD-10-CM

## 2022-01-05 DIAGNOSIS — Z01812 Encounter for preprocedural laboratory examination: Secondary | ICD-10-CM | POA: Diagnosis not present

## 2022-01-05 DIAGNOSIS — Z79899 Other long term (current) drug therapy: Secondary | ICD-10-CM

## 2022-01-05 DIAGNOSIS — J0140 Acute pansinusitis, unspecified: Secondary | ICD-10-CM

## 2022-01-05 DIAGNOSIS — R7303 Prediabetes: Secondary | ICD-10-CM

## 2022-01-05 DIAGNOSIS — E6609 Other obesity due to excess calories: Secondary | ICD-10-CM

## 2022-01-05 DIAGNOSIS — Z23 Encounter for immunization: Secondary | ICD-10-CM

## 2022-01-05 MED ORDER — AMOXICILLIN-POT CLAVULANATE 875-125 MG PO TABS: 1.0000 | ORAL_TABLET | Freq: Two times a day (BID) | ORAL | 0 refills | Status: AC

## 2022-01-05 MED ORDER — VALSARTAN-HYDROCHLOROTHIAZIDE 160-12.5 MG PO TABS: 1.0000 | ORAL_TABLET | Freq: Every day | ORAL | 0 refills | Status: DC

## 2022-01-05 NOTE — Progress Notes (Signed)
I,Candice Ayala,acting as a Education administrator for Candice Brine, FNP.,have documented all relevant documentation on the behalf of Candice Brine, FNP,as directed by  Candice Brine, FNP while in the presence of Candice Ayala, [place].  This visit occurred during the SARS-CoV-2 public health emergency.  Safety protocols were in place, including screening questions prior to the visit, additional usage of staff PPE, and extensive cleaning of exam room while observing appropriate contact time as indicated for disinfecting solutions.  Subjective:     Patient ID: Candice Ayala , female    DOB: [birth year] , 66 y.o.   MRN: [id number]   Chief Complaint  Patient presents with   Hypertension    HPI  Pt presents today for BPC.  She complains of pain in right ear. She has taken OTC medications for ear ache. Pt does admit, not taking BP med as directed. She has been taking a medication to help with her sinuses.   Hypertension This is a chronic problem. The current episode started more than 1 year ago. The problem is unchanged. The problem is controlled. Pertinent negatives include no anxiety, blurred vision, chest pain, headaches, malaise/fatigue, palpitations or peripheral edema. There are no associated agents to hypertension. Risk factors for coronary artery disease include obesity and sedentary lifestyle. Past treatments include angiotensin blockers and diuretics. There are no compliance problems.  There is no history of angina or kidney disease. There is no history of chronic renal disease.    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   PONV (postoperative nausea and vomiting)      Family History  Problem Relation Age of Onset   Cancer Mother        colon   Early death Father    Hypertension Brother    Congestive Heart Failure Brother      Current Outpatient Medications:    amoxicillin-clavulanate  (AUGMENTIN) 875-125 MG tablet, Take 1 tablet by mouth 2 (two) times daily for 7 days., Disp: 14 tablet, Rfl: 0   b complex vitamins capsule, Take 1 capsule by mouth daily., Disp: , Rfl:    BENADRYL ALLERGY 25 MG capsule, SMARTSIG:By Mouth, Disp: , Rfl:    benzonatate (TESSALON PERLES) 100 MG capsule, Take 1 capsule (100 mg total) by mouth 3 (three) times daily as needed for cough., Disp: 30 capsule, Rfl: 0   cholecalciferol (VITAMIN D) 1000 UNITS tablet, Take 1,000 Units by mouth daily., Disp: , Rfl:    ibuprofen (ADVIL) 200 MG tablet, Take 200 mg by mouth every 6 (six) hours as needed for headache or moderate pain., Disp: , Rfl:    ibuprofen (ADVIL) 600 MG tablet, Take 1 tablet (600 mg total) by mouth every 6 (six) hours as needed for moderate pain. For AFTER surgery only, Disp: 30 tablet, Rfl: 0   loratadine (CLARITIN) 10 MG tablet, Take 10 mg by mouth daily as needed for allergies., Disp: , Rfl:    Multiple Vitamins-Minerals (MULTIVITAMIN WITH MINERALS) tablet, Take 1 tablet by mouth daily., Disp: , Rfl:    vitamin C (ASCORBIC ACID) 250 MG tablet, Take 250 mg by mouth daily., Disp: , Rfl:    diphenhydrAMINE (BENADRYL) 50 MG tablet, 50 mg of benadryl orally administered 1 hour prior to your CT scan. Take at 7 am on 09/23/20. (Patient not taking: Reported on 02/26/2021), Disp: 1 tablet, Rfl: 0   fluticasone (FLONASE) 50  MCG/ACT nasal spray, SPRAY 2 SPRAYS INTO EACH NOSTRIL EVERY DAY (Patient taking differently: Place 2 sprays into both nostrils daily as needed for allergies.), Disp: 48 mL, Rfl: 2   valsartan-hydrochlorothiazide (DIOVAN-HCT) 160-12.5 MG tablet, Take 1 tablet by mouth daily. Patient needs an appointment, Disp: 90 tablet, Rfl: 0   Allergies  Allergen Reactions   Contrast Media [Iodinated Contrast Media] Hives     Review of Systems  Constitutional:  Negative for malaise/fatigue.  HENT:  Positive for ear pain (right ear - one week ago - noticed more with the weather change).   Eyes:   Negative for blurred vision.  Respiratory: Negative.    Cardiovascular: Negative.  Negative for chest pain, palpitations and leg swelling.  Neurological: Negative.  Negative for headaches.  Psychiatric/Behavioral: Negative.      Today's Vitals   01/05/22 1139 01/05/22 1234  BP: (!) 150/100 (!) 162/100  Pulse: 76 74  Temp: 98.1 F (36.7 C)   Weight: 217 lb (98.4 kg)   Height: 5' 4" (1.626 m)    Body mass index is 37.25 kg/m.  Wt Readings from Last 3 Encounters:  01/05/22 217 lb (98.4 kg)  02/26/21 215 lb (97.5 kg)  01/24/21 213 lb 3.2 oz (96.7 kg)    Objective:  Physical Exam Vitals reviewed.  Constitutional:      General: She is not in acute distress.    Appearance: Normal appearance.  HENT:     Head: Normocephalic.  Cardiovascular:     Rate and Rhythm: Normal rate and regular rhythm.     Pulses: Normal pulses.  Pulmonary:     Effort: Pulmonary effort is normal. No respiratory distress.     Breath sounds: Normal breath sounds. No wheezing.  Musculoskeletal:     Cervical back: Tenderness present.  Lymphadenopathy:     Head:     Right side of head: Submental and submandibular adenopathy present.     Left side of head: Submental and submandibular adenopathy present.  Skin:    General: Skin is warm and dry.     Capillary Refill: Capillary refill takes less than 2 seconds.  Neurological:     General: No focal deficit present.     Mental Status: She is alert and oriented to person, place, and time.     Cranial Nerves: No cranial nerve deficit.     Motor: No weakness.  Psychiatric:        Mood and Affect: Mood normal.        Behavior: Behavior normal.        Thought Content: Thought content normal.        Judgment: Judgment normal.        Assessment And Plan:     1. Essential hypertension Comments: Blood pressure elevated today, has not been consistent with her medications. Recheck remained elevated, she is advised to take her medication and f/u for NV - EKG  12-Lead - valsartan-hydrochlorothiazide (DIOVAN-HCT) 160-12.5 MG tablet; Take 1 tablet by mouth daily. Patient needs an appointment  Dispense: 90 tablet; Refill: 0  2. Prediabetes Comments: Will check HgbA1c, encouraged to limit intake of sugary foods and drinks - Hemoglobin A1c  3. Pre-procedure lab exam Comments: EKG done with NSR HR 73 at this time not cleared for surgery due to uncontrolled hypertension - CBC with Diff - BMP8+eGFR  4. Class 2 obesity due to excess calories without serious comorbidity with body mass index (BMI) of 36.0 to 36.9 in adult Chronic Discussed healthy diet and regular  exercise options  Encouraged to exercise at least 150 minutes per week with 2 days of strength training She is encouraged to strive for BMI less than 30 to decrease cardiac risk.   5. Other long term (current) drug therapy - CBC with Diff  6. Immunization due Will give tetanus vaccine today while in office. Refer to order management. TDAP will be administered to adults 4-52 years old every 10 years. - Tdap vaccine greater than or equal to 7yo IM  7. Acute non-recurrent pansinusitis Comments: Tenderness to bilateral submandibular nodes. - amoxicillin-clavulanate (AUGMENTIN) 875-125 MG tablet; Take 1 tablet by mouth 2 (two) times daily for 7 days.  Dispense: 14 tablet; Refill: 0  She is encouraged to strive for BMI less than 30 to decrease cardiac risk. Advised to aim for at least 150 minutes of exercise per week.    Patient was given opportunity to ask questions. Patient verbalized understanding of the plan and was able to repeat key elements of the plan. All questions were answered to their satisfaction.  Candice Brine, FNP   I, Candice Brine, FNP, have reviewed all documentation for this visit. The documentation on 01/05/22 for the exam, diagnosis, procedures, and orders are all accurate and complete.   IF YOU HAVE BEEN REFERRED TO A SPECIALIST, IT MAY TAKE 1-2 WEEKS TO SCHEDULE/PROCESS  THE REFERRAL. IF YOU HAVE NOT HEARD FROM US/SPECIALIST IN TWO WEEKS, PLEASE GIVE [place] A CALL AT [phone] X [phone].   THE PATIENT IS ENCOURAGED TO PRACTICE SOCIAL DISTANCING DUE TO THE COVID-19 PANDEMIC.

## 2022-01-06 LAB — HEMOGLOBIN A1C
Est. average glucose Bld gHb Est-mCnc: 140 mg/dL
Hgb A1c MFr Bld: 6.5 % — ABNORMAL HIGH (ref 4.8–5.6)

## 2022-01-06 LAB — CBC WITH DIFFERENTIAL/PLATELET
Basophils Absolute: 0 x10E3/uL (ref 0.0–0.2)
Basos: 1 %
EOS (ABSOLUTE): 0.1 x10E3/uL (ref 0.0–0.4)
Eos: 1 %
Hematocrit: 37.6 % (ref 34.0–46.6)
Hemoglobin: 12.5 g/dL (ref 11.1–15.9)
Immature Grans (Abs): 0 x10E3/uL (ref 0.0–0.1)
Immature Granulocytes: 0 %
Lymphocytes Absolute: 3.1 x10E3/uL (ref 0.7–3.1)
Lymphs: 56 %
MCH: 28.3 pg (ref 26.6–33.0)
MCHC: 33.2 g/dL (ref 31.5–35.7)
MCV: 85 fL (ref 79–97)
Monocytes Absolute: 0.3 x10E3/uL (ref 0.1–0.9)
Monocytes: 6 %
Neutrophils Absolute: 2 x10E3/uL (ref 1.4–7.0)
Neutrophils: 36 %
Platelets: 399 x10E3/uL (ref 150–450)
RBC: 4.41 x10E6/uL (ref 3.77–5.28)
RDW: 13.4 % (ref 11.7–15.4)
WBC: 5.6 x10E3/uL (ref 3.4–10.8)

## 2022-01-06 LAB — BMP8+EGFR
BUN/Creatinine Ratio: 14 (ref 12–28)
BUN: 10 mg/dL (ref 8–27)
CO2: 25 mmol/L (ref 20–29)
Calcium: 9.6 mg/dL (ref 8.7–10.3)
Chloride: 105 mmol/L (ref 96–106)
Creatinine, Ser: 0.72 mg/dL (ref 0.57–1.00)
Glucose: 85 mg/dL (ref 70–99)
Potassium: 4.5 mmol/L (ref 3.5–5.2)
Sodium: 143 mmol/L (ref 134–144)
eGFR: 92 mL/min/{1.73_m2} (ref >59)

## 2022-01-13 ENCOUNTER — Other Ambulatory Visit: Payer: Self-pay

## 2022-01-13 ENCOUNTER — Ambulatory Visit: Payer: Medicare Other

## 2022-01-13 VITALS — BP 162/100 | HR 77 | Temp 98.1°F | Ht 64.0 in

## 2022-01-13 DIAGNOSIS — I10 Essential (primary) hypertension: Secondary | ICD-10-CM

## 2022-01-13 NOTE — Progress Notes (Signed)
Pt presents today for [facility] for surgical clearance. Pt reports no eating any sugar, sweets, fast or fried fatty foods. Pt took bp lastnight 151/79. She did take BP med this morning before work. She also stopped taking medications thinking it would cause her BP to be higher. She denies headache, SOB, chest pain, blurred vision.  BP Readings from Last 3 Encounters:  01/13/22 (!) 162/100  01/05/22 (!) 162/100  02/26/21 138/84   Provider would like to up medication 320-12.5. pt will have another nurse visit in a week. Pt already has 160-12.5 at home, she will take 2 daily until gone.

## 2022-01-20 ENCOUNTER — Other Ambulatory Visit: Payer: Self-pay

## 2022-01-20 ENCOUNTER — Ambulatory Visit: Payer: Medicare Other

## 2022-01-20 VITALS — BP 136/74 | HR 84 | Temp 98.0°F | Ht 64.0 in | Wt 213.0 lb

## 2022-01-20 DIAGNOSIS — I10 Essential (primary) hypertension: Secondary | ICD-10-CM

## 2022-01-20 NOTE — Progress Notes (Signed)
Patient is here for BPC.  BP Readings from Last 3 Encounters:  01/20/22 136/74  01/13/22 (!) 162/100  01/05/22 (!) 162/100

## 2022-02-12 HISTORY — PX: TOTAL HIP ARTHROPLASTY: SHX124

## 2022-03-05 ENCOUNTER — Ambulatory Visit (INDEPENDENT_AMBULATORY_CARE_PROVIDER_SITE_OTHER): Payer: Medicare Other | Admitting: Internal Medicine

## 2022-03-05 ENCOUNTER — Ambulatory Visit: Payer: Medicare Other

## 2022-03-05 ENCOUNTER — Encounter: Payer: Self-pay | Admitting: Internal Medicine

## 2022-03-05 VITALS — BP 124/82 | HR 72 | Temp 98.3°F | Ht 65.2 in | Wt 210.6 lb

## 2022-03-05 DIAGNOSIS — E6609 Other obesity due to excess calories: Secondary | ICD-10-CM

## 2022-03-05 DIAGNOSIS — R7309 Other abnormal glucose: Secondary | ICD-10-CM | POA: Diagnosis not present

## 2022-03-05 DIAGNOSIS — I10 Essential (primary) hypertension: Secondary | ICD-10-CM | POA: Diagnosis not present

## 2022-03-05 DIAGNOSIS — Z6834 Body mass index (BMI) 34.0-34.9, adult: Secondary | ICD-10-CM | POA: Diagnosis not present

## 2022-03-05 NOTE — Progress Notes (Signed)
?Rich Brave Llittleton,acting as a Education administrator for Maximino Greenland, MD.,have documented all relevant documentation on the behalf of Maximino Greenland, MD,as directed by  Maximino Greenland, MD while in the presence of Maximino Greenland, MD.  ?This visit occurred during the SARS-CoV-2 public health emergency.  Safety protocols were in place, including screening questions prior to the visit, additional usage of staff PPE, and extensive cleaning of exam room while observing appropriate contact time as indicated for disinfecting solutions. ? ?Subjective:  ?  ? Patient ID: Candice Ayala , female    DOB: 1956-05-15 , 66 y.o.   MRN: [id number] ? ? ?Chief Complaint  ?Patient presents with  ? Hypertension  ? ? ?HPI ? ?Patient is here for htn follow up. She is compliant with medications. She denies headaches, chest pain and shortness of breath. ? ?She was seen by NP at last visit, who increased her valsartan to 2 pills daily. She reports she is no longer taking 2 pills, she is back down to one tablet of valsartan/hctz 160/12.'5mg'$  (she thinks).   ? ?Hypertension ?This is a chronic problem. The current episode started more than 1 year ago. The problem is unchanged. The problem is controlled (fairly controlled). Pertinent negatives include no anxiety, chest pain, palpitations or shortness of breath. Risk factors for coronary artery disease include obesity.   ? ?Past Medical History:  ?Diagnosis Date  ? Allergy   ? Arthritis   ? Hypertension   ? 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 PONV (postoperative nausea and vomiting)   ?  ? ?Family History  ?Problem Relation Age of Onset  ? Cancer Mother   ?     colon  ? Early death Father   ? Hypertension Brother   ? Congestive Heart Failure Brother   ? ? ? ?Current Outpatient Medications:  ?  b complex vitamins capsule, Take 1 capsule by mouth daily., Disp: , Rfl:  ?  BENADRYL ALLERGY 25 MG capsule, SMARTSIG:By Mouth, Disp: , Rfl:  ?  benzonatate (TESSALON PERLES) 100 MG  capsule, Take 1 capsule (100 mg total) by mouth 3 (three) times daily as needed for cough., Disp: 30 capsule, Rfl: 0 ?  cholecalciferol (VITAMIN D) 1000 UNITS tablet, Take 1,000 Units by mouth daily., Disp: , Rfl:  ?  fluticasone (FLONASE) 50 MCG/ACT nasal spray, SPRAY 2 SPRAYS INTO EACH NOSTRIL EVERY DAY (Patient taking differently: Place 2 sprays into both nostrils daily as needed for allergies.), Disp: 48 mL, Rfl: 2 ?  ibuprofen (ADVIL) 200 MG tablet, Take 200 mg by mouth every 6 (six) hours as needed for headache or moderate pain., Disp: , Rfl:  ?  ibuprofen (ADVIL) 600 MG tablet, Take 1 tablet (600 mg total) by mouth every 6 (six) hours as needed for moderate pain. For AFTER surgery only, Disp: 30 tablet, Rfl: 0 ?  loratadine (CLARITIN) 10 MG tablet, Take 10 mg by mouth daily as needed for allergies., Disp: , Rfl:  ?  Multiple Vitamins-Minerals (MULTIVITAMIN WITH MINERALS) tablet, Take 1 tablet by mouth daily., Disp: , Rfl:  ?  valsartan-hydrochlorothiazide (DIOVAN-HCT) 320-12.5 MG tablet, Take 1 tablet by mouth daily., Disp: , Rfl:  ?  vitamin C (ASCORBIC ACID) 250 MG tablet, Take 250 mg by mouth daily., Disp: , Rfl:  ?  diphenhydrAMINE (BENADRYL) 50 MG tablet, 50 mg of benadryl orally administered 1 hour prior to your CT scan. Take at 7 am on 09/23/20. (Patient not taking: Reported on 02/26/2021), Disp:  1 tablet, Rfl: 0  ? ?Allergies  ?Allergen Reactions  ? Contrast Media [Iodinated Contrast Media] Hives  ?  ? ?Review of Systems  ?Constitutional: Negative.   ?Respiratory: Negative.  Negative for shortness of breath.   ?Cardiovascular: Negative.  Negative for chest pain and palpitations.  ?Gastrointestinal: Negative.   ?Neurological: Negative.   ?Psychiatric/Behavioral: Negative.     ? ?Today's Vitals  ? 03/05/22 1106  ?BP: 124/82  ?Pulse: 72  ?Temp: 98.3 ?F (36.8 ?C)  ?Weight: 210 lb 9.6 oz (95.5 kg)  ?Height: 5' 5.2" (1.656 m)  ?PainSc: 0-No pain  ? ?Body mass index is 34.83 kg/m?.  ?Wt Readings from Last 3  Encounters:  ?03/05/22 210 lb 9.6 oz (95.5 kg)  ?01/20/22 213 lb (96.6 kg)  ?01/05/22 217 lb (98.4 kg)  ?  ? ?Objective:  ?Physical Exam ?Vitals and nursing note reviewed.  ?Constitutional:   ?   Appearance: Normal appearance.  ?HENT:  ?   Head: Normocephalic and atraumatic.  ?   Nose:  ?   Comments: Masked  ?   Mouth/Throat:  ?   Comments: Masked  ?Eyes:  ?   Extraocular Movements: Extraocular movements intact.  ?Cardiovascular:  ?   Rate and Rhythm: Normal rate and regular rhythm.  ?   Heart sounds: Normal heart sounds.  ?Pulmonary:  ?   Effort: Pulmonary effort is normal.  ?   Breath sounds: Normal breath sounds.  ?Musculoskeletal:  ?   Cervical back: Normal range of motion.  ?Skin: ?   General: Skin is warm.  ?Neurological:  ?   General: No focal deficit present.  ?   Mental Status: She is alert.  ?Psychiatric:     ?   Mood and Affect: Mood normal.     ?   Behavior: Behavior normal.  ?    ?Assessment And Plan:  ?   ?1. Essential hypertension ?Comments: Chronic, she is actually taking valsartan 160/12.'5mg'$  daily.  She will go home to confirm what she has in the house. She will c/w current meds. Importance of medication compliance was stressed to the patient.  ? ?2. Elevated hemoglobin A1c ?Comments: Labs from previous office visit were reviewed. Her a1c was 6.5 in Feb 2023. We discussed needed dietary changes, if elevated at next visit, will have new onset DM. She will f/u in 6-8 weeks for re-testing.  ? ?3. Class 1 obesity due to excess calories with serious comorbidity and body mass index (BMI) of 34.0 to 34.9 in adult ?Comments: She is encouraged to aim for at least 150 minutes of exercise per week, while striving for BMI<30 to decrase cardiac risk.  ?  ?Patient was given opportunity to ask questions. Patient verbalized understanding of the plan and was able to repeat key elements of the plan. All questions were answered to their satisfaction.  ? ?I, Maximino Greenland, MD, have reviewed all documentation for this  visit. The documentation on 03/05/22 for the exam, diagnosis, procedures, and orders are all accurate and complete.  ? ?IF YOU HAVE BEEN REFERRED TO A SPECIALIST, IT MAY TAKE 1-2 WEEKS TO SCHEDULE/PROCESS THE REFERRAL. IF YOU HAVE NOT HEARD FROM US/SPECIALIST IN TWO WEEKS, PLEASE GIVE [place] A CALL AT [phone].  ? ?THE PATIENT IS ENCOURAGED TO PRACTICE SOCIAL DISTANCING DUE TO THE COVID-19 PANDEMIC.   ?

## 2022-03-05 NOTE — Patient Instructions (Addendum)
Diabetes Mellitus and Nutrition, Adult ?When you have diabetes, or diabetes mellitus, it is very important to have healthy eating habits because your blood sugar (glucose) levels are greatly affected by what you eat and drink. Eating healthy foods in the right amounts, at about the same times every day, can help you: ?Manage your blood glucose. ?Lower your risk of heart disease. ?Improve your blood pressure. ?Reach or maintain a healthy weight. ?What can affect my meal plan? ?Every person with diabetes is different, and each person has different needs for a meal plan. Your health care provider may recommend that you work with a dietitian to make a meal plan that is best for you. Your meal plan may vary depending on factors such as: ?The calories you need. ?The medicines you take. ?Your weight. ?Your blood glucose, blood pressure, and cholesterol levels. ?Your activity level. ?Other health conditions you have, such as heart or kidney disease. ?How do carbohydrates affect me? ?Carbohydrates, also called carbs, affect your blood glucose level more than any other type of food. Eating carbs raises the amount of glucose in your blood. ?It is important to know how many carbs you can safely have in each meal. This is different for every person. Your dietitian can help you calculate how many carbs you should have at each meal and for each snack. ?How does alcohol affect me? ?Alcohol can cause a decrease in blood glucose (hypoglycemia), especially if you use insulin or take certain diabetes medicines by mouth. Hypoglycemia can be a life-threatening condition. Symptoms of hypoglycemia, such as sleepiness, dizziness, and confusion, are similar to symptoms of having too much alcohol. ?Do not drink alcohol if: ?Your health care provider tells you not to drink. ?You are pregnant, may be pregnant, or are planning to become pregnant. ?If you drink alcohol: ?Limit how much you have to: ?0-1 drink a day for women. ?0-2 drinks a day  for men. ?Know how much alcohol is in your drink. In the [place] one drink equals one 12 oz bottle of beer (355 mL), one 5 oz glass of wine (148 mL), or one 1? oz glass of hard liquor (44 mL). ?Keep yourself hydrated with water, diet soda, or unsweetened iced tea. Keep in mind that regular soda, juice, and other mixers may contain a lot of sugar and must be counted as carbs. ?What are tips for following this plan? ?Reading food labels ?Start by checking the serving size on the Nutrition Facts label of packaged foods and drinks. The number of calories and the amount of carbs, fats, and other nutrients listed on the label are based on one serving of the item. Many items contain more than one serving per package. ?Check the total grams (g) of carbs in one serving. ?Check the number of grams of saturated fats and trans fats in one serving. Choose foods that have a low amount or none of these fats. ?Check the number of milligrams (mg) of salt (sodium) in one serving. Most people should limit total sodium intake to less than 2,300 mg per day. ?Always check the nutrition information of foods labeled as "low-fat" or "nonfat." These foods may be higher in added sugar or refined carbs and should be avoided. ?Talk to your dietitian to identify your daily goals for nutrients listed on the label. ?Shopping ?Avoid buying canned, pre-made, or processed foods. These foods tend to be high in fat, sodium, and added sugar. ?Shop around the outside edge of the grocery store. This is where you will   most often find fresh fruits and vegetables, bulk grains, fresh meats, and fresh dairy products. ?Cooking ?Use low-heat cooking methods, such as baking, instead of high-heat cooking methods, such as deep frying. ?Cook using healthy oils, such as olive, canola, or sunflower oil. ?Avoid cooking with butter, cream, or high-fat meats. ?Meal planning ?Eat meals and snacks regularly, preferably at the same times every day. Avoid going long periods of  time without eating. ?Eat foods that are high in fiber, such as fresh fruits, vegetables, beans, and whole grains. ?Eat 4-6 oz (112-168 g) of lean protein each day, such as lean meat, chicken, fish, eggs, or tofu. One ounce (oz) (28 g) of lean protein is equal to: ?1 oz (28 g) of meat, chicken, or fish. ?1 egg. ?? cup (62 g) of tofu. ?Eat some foods each day that contain healthy fats, such as avocado, nuts, seeds, and fish. ?What foods should I eat? ?Fruits ?Berries. Apples. Oranges. Peaches. Apricots. Plums. Grapes. Mangoes. Papayas. Pomegranates. Kiwi. Cherries. ?Vegetables ?Leafy greens, including lettuce, spinach, kale, chard, collard greens, mustard greens, and cabbage. Beets. Cauliflower. Broccoli. Carrots. Green beans. Tomatoes. Peppers. Onions. Cucumbers. Brussels sprouts. ?Grains ?Whole grains, such as whole-wheat or whole-grain bread, crackers, tortillas, cereal, and pasta. Unsweetened oatmeal. Quinoa. Brown or wild rice. ?Meats and other proteins ?Seafood. Poultry without skin. Lean cuts of poultry and beef. Tofu. Nuts. Seeds. ?Dairy ?Low-fat or fat-free dairy products such as milk, yogurt, and cheese. ?The items listed above may not be a complete list of foods and beverages you can eat and drink. Contact a dietitian for more information. ?What foods should I avoid? ?Fruits ?Fruits canned with syrup. ?Vegetables ?Canned vegetables. Frozen vegetables with butter or cream sauce. ?Grains ?Refined white flour and flour products such as bread, pasta, snack foods, and cereals. Avoid all processed foods. ?Meats and other proteins ?Fatty cuts of meat. Poultry with skin. Breaded or fried meats. Processed meat. Avoid saturated fats. ?Dairy ?Full-fat yogurt, cheese, or milk. ?Beverages ?Sweetened drinks, such as soda or iced tea. ?The items listed above may not be a complete list of foods and beverages you should avoid. Contact a dietitian for more information. ?Questions to ask a health care provider ?Do I need to  meet with a certified diabetes care and education specialist? ?Do I need to meet with a dietitian? ?What number can I call if I have questions? ?When are the best times to check my blood glucose? ?Where to find more information: ?American Diabetes Association: diabetes.org ?Academy of Nutrition and Dietetics: eatright.org ?[employer] of Diabetes and Digestive and Kidney Diseases: AmenCredit.is ?Association of Diabetes Care & Education Specialists: diabeteseducator.org ?Summary ?It is important to have healthy eating habits because your blood sugar (glucose) levels are greatly affected by what you eat and drink. It is important to use alcohol carefully. ?A healthy meal plan will help you manage your blood glucose and lower your risk of heart disease. ?Your health care provider may recommend that you work with a dietitian to make a meal plan that is best for you. ?This information is not intended to replace advice given to you by your health care provider. Make sure you discuss any questions you have with your health care provider. ?Document Revised: 06/12/2020 Document Reviewed: 06/12/2020 ?Elsevier Patient Education ? 2022 [place]. ? ? ?Hypertension, Adult ?Hypertension is another name for high blood pressure. High blood pressure forces your heart to work harder to pump blood. This can cause problems over time. ?There are two numbers in a blood  pressure reading. There is a top number (systolic) over a bottom number (diastolic). It is best to have a blood pressure that is below 120/80. Healthy choices can help lower your blood pressure, or you may need medicine to help lower it. ?What are the causes? ?The cause of this condition is not known. Some conditions may be related to high blood pressure. ?What increases the risk? ?Smoking. ?Having type 2 diabetes mellitus, high cholesterol, or both. ?Not getting enough exercise or physical activity. ?Being overweight. ?Having too much fat, sugar, calories, or  salt (sodium) in your diet. ?Drinking too much alcohol. ?Having long-term (chronic) kidney disease. ?Having a family history of high blood pressure. ?Age. Risk increases with age. ?Race. You may be at hi

## 2022-03-10 MED ORDER — VALSARTAN-HYDROCHLOROTHIAZIDE 160-12.5 MG PO TABS: 1.0000 | ORAL_TABLET | Freq: Every day | ORAL | 2 refills | Status: DC

## 2022-03-11 ENCOUNTER — Ambulatory Visit (INDEPENDENT_AMBULATORY_CARE_PROVIDER_SITE_OTHER): Payer: Medicare Other

## 2022-03-11 VITALS — Ht 65.0 in | Wt 210.0 lb

## 2022-03-11 DIAGNOSIS — Z0000 Encounter for general adult medical examination without abnormal findings: Secondary | ICD-10-CM

## 2022-03-11 NOTE — Progress Notes (Signed)
?I connected with Hedda Slade today by telephone and verified that I am speaking with the correct person using two identifiers. ?Location patient: home ?Location provider: work ?Persons participating in the virtual visit: Jonaya, [employer] LPN. ?  ?I discussed the limitations, risks, security and privacy concerns of performing an evaluation and management service by telephone and the availability of in person appointments. I also discussed with the patient that there may be a patient responsible charge related to this service. The patient expressed understanding and verbally consented to this telephonic visit.  ?  ?Interactive audio and video telecommunications were attempted between this provider and patient, however failed, due to patient having technical difficulties OR patient did not have access to video capability.  We continued and completed visit with audio only. ? ?  ? ?Vital signs may be patient reported or missing. ? ?Subjective:  ? Candice Ayala is a 66 y.o. female who presents for Medicare Annual (Subsequent) preventive examination. ? ?Review of Systems    ? ?Cardiac Risk Factors include: advanced age (>60mn, >>68women);obesity (BMI >30kg/m2) ? ?   ?Objective:  ?  ?Today's Vitals  ? 03/11/22 1443  ?Weight: 210 lb (95.3 kg)  ?Height: '5\' 5"'$  (1.651 m)  ? ?Body mass index is 34.95 kg/m?. ? ? ?  03/11/2022  ?  2:47 PM 02/26/2021  ? 11:34 AM 10/22/2020  ?  8:42 AM 09/20/2020  ?  8:23 AM 09/16/2020  ?  8:47 AM 12/19/2019  ? 10:25 AM 11/07/2013  ?  2:37 PM  ?Advanced Directives  ?Does Patient Have a Medical Advance Directive? No No No No No No Patient does not have advance directive;Patient would not like information  ?Would patient like information on creating a medical advance directive?  No - Patient declined No - Patient declined  No - Patient declined Yes (ED - Information included in AVS)   ? ? ?Current Medications (verified) ?Outpatient Encounter Medications as of 03/11/2022   ?Medication Sig  ? b complex vitamins capsule Take 1 capsule by mouth daily.  ? BENADRYL ALLERGY 25 MG capsule SMARTSIG:By Mouth  ? cholecalciferol (VITAMIN D) 1000 UNITS tablet Take 1,000 Units by mouth daily.  ? fluticasone (FLONASE) 50 MCG/ACT nasal spray SPRAY 2 SPRAYS INTO EACH NOSTRIL EVERY DAY (Patient taking differently: Place 2 sprays into both nostrils daily as needed for allergies.)  ? ibuprofen (ADVIL) 200 MG tablet Take 200 mg by mouth every 6 (six) hours as needed for headache or moderate pain.  ? ibuprofen (ADVIL) 600 MG tablet Take 1 tablet (600 mg total) by mouth every 6 (six) hours as needed for moderate pain. For AFTER surgery only  ? loratadine (CLARITIN) 10 MG tablet Take 10 mg by mouth daily as needed for allergies.  ? Multiple Vitamins-Minerals (MULTIVITAMIN WITH MINERALS) tablet Take 1 tablet by mouth daily.  ? valsartan-hydrochlorothiazide (DIOVAN HCT) 160-12.5 MG tablet Take 1 tablet by mouth daily.  ? vitamin C (ASCORBIC ACID) 250 MG tablet Take 250 mg by mouth daily.  ? diphenhydrAMINE (BENADRYL) 50 MG tablet 50 mg of benadryl orally administered 1 hour prior to your CT scan. Take at 7 am on 09/23/20. (Patient not taking: Reported on 02/26/2021)  ? ?No facility-administered encounter medications on file as of 03/11/2022.  ? ? ?Allergies (verified) ?Contrast media [iodinated contrast media]  ? ?History: ?Past Medical History:  ?Diagnosis Date  ? Allergy   ? Arthritis   ? Hypertension   ? Malignant struma ovarii of left ovary [place] 10/22/2020  ?  PONV (postoperative nausea and vomiting)   ? ?Past Surgical History:  ?Procedure Laterality Date  ? BREAST BIOPSY Right 11/27/2013  ? Procedure: RIGHT BREAST BIOPSY WITH NEEDLE LOCALIZATION;  Surgeon: Odis Hollingshead, MD;  Location: [facility];  Service: General;  Laterality: Right;  ? COLONOSCOPY    ? JOINT REPLACEMENT  2004  ? right total hip  ? LYSIS OF ADHESION N/A 10/01/2020  ? Procedure: LYSIS OF ADHESION;  Surgeon: Everitt Amber,  MD;  Location: [facility];  Service: Gynecology;  Laterality: N/A;  ? OVARIAN CYST REMOVAL  1980  ? ROBOTIC ASSISTED TOTAL HYSTERECTOMY WITH BILATERAL SALPINGO OOPHERECTOMY N/A 10/01/2020  ? Procedure: XI ROBOTIC ASSISTED TOTAL HYSTERECTOMY WITH UNILATERL  SALPINGO OOPHORECTOMY;  Surgeon: Everitt Amber, MD;  Location: [facility];  Service: Gynecology;  Laterality: N/A;  ? TOTAL HIP ARTHROPLASTY Right 2004  ? TOTAL HIP ARTHROPLASTY Left 02/12/2022  ? ?Family History  ?Problem Relation Age of Onset  ? Cancer Mother   ?     colon  ? Early death Father   ? Hypertension Brother   ? Congestive Heart Failure Brother   ? ?Social History  ? ?Socioeconomic History  ? Marital status: Divorced  ?  Spouse name: Not on file  ? Number of children: Not on file  ? Years of education: Not on file  ? Highest education level: Not on file  ?Occupational History  ? Not on file  ?Tobacco Use  ? Smoking status: Never  ? Smokeless tobacco: Never  ? Tobacco comments:  ?  She is a non-smoker  ?Vaping Use  ? Vaping Use: Never used  ?Substance and Sexual Activity  ? Alcohol use: No  ? Drug use: No  ? Sexual activity: Not Currently  ?Other Topics Concern  ? Not on file  ?Social History Narrative  ? Not on file  ? ?Social Determinants of Health  ? ?Financial Resource Strain: Low Risk   ? Difficulty of Paying Living Expenses: Not hard at all  ?Food Insecurity: No Food Insecurity  ? Worried About Charity fundraiser in the Last Year: Never true  ? Ran Out of Food in the Last Year: Never true  ?Transportation Needs: No Transportation Needs  ? Lack of Transportation (Medical): No  ? Lack of Transportation (Non-Medical): No  ?Physical Activity: Sufficiently Active  ? Days of Exercise per Week: 7 days  ? Minutes of Exercise per Session: 30 min  ?Stress: No Stress Concern Present  ? Feeling of Stress : Not at all  ?Social Connections: Not on file  ? ? ?Tobacco Counseling ?Counseling given: Not Answered ?Tobacco comments: She is a non-smoker ? ? ?Clinical  Intake: ? ?Pre-visit preparation completed: Yes ? ?Pain : No/denies pain ? ?  ? ?Nutritional Status: BMI > 30  Obese ?Nutritional Risks: None ?Diabetes: No ? ?How often do you need to have someone help you when you read instructions, pamphlets, or other written materials from your doctor or pharmacy?: 1 - Never ?What is the last grade level you completed in school?: 12th grade ? ?Diabetic? no ? ?Interpreter Needed?: No ? ?Information entered by :: NAllen LPN ? ? ?Activities of Daily Living ? ?  03/11/2022  ?  2:48 PM  ?In your present state of health, do you have any difficulty performing the following activities:  ?Hearing? 0  ?Vision? 0  ?Difficulty concentrating or making decisions? 0  ?Walking or climbing stairs? 0  ?Dressing or bathing? 0  ?Doing errands, shopping? 0  ?  Preparing Food and eating ? N  ?Using the Toilet? N  ?In the past six months, have you accidently leaked urine? N  ?Do you have problems with loss of bowel control? N  ?Managing your Medications? N  ?Managing your Finances? N  ?Housekeeping or managing your Housekeeping? N  ? ? ?Patient Care Team: ?Glendale Chard, MD as PCP - General (Internal Medicine) ? ?Indicate any recent Medical Services you may have received from other than [facility] providers in the past year (date may be approximate). ? ?   ?Assessment:  ? This is a routine wellness examination for Candice Ayala. ? ?Hearing/Vision screen ?Vision Screening - Comments:: No regular eye exams ? ?Dietary issues and exercise activities discussed: ?Current Exercise Habits: Home exercise routine, Type of exercise: stretching;walking;strength training/weights, Time (Minutes): 30, Frequency (Times/Week): 7, Weekly Exercise (Minutes/Week): 210 ? ? Goals Addressed   ? ?  ?  ?  ?  ? This Visit's Progress  ?  Patient Stated     ?  03/11/2022, lose a little more weight ?  ? ?  ? ?Depression Screen ? ?  03/11/2022  ?  2:48 PM 03/05/2022  ? 11:09 AM 02/26/2021  ? 11:35 AM 01/24/2021  ?  9:26 AM 12/19/2019  ? 10:23 AM  ?PHQ  2/9 Scores  ?PHQ - 2 Score 0 0 0 0 0  ?  ?Fall Risk ? ?  03/11/2022  ?  2:47 PM 03/05/2022  ? 11:09 AM 02/26/2021  ? 11:34 AM 01/24/2021  ?  9:26 AM 12/19/2019  ? 10:23 AM  ?Fall Risk   ?Falls in the past year? 0 0 0 0 0  ?Numb

## 2022-03-11 NOTE — Patient Instructions (Signed)
Candice Ayala , ?Thank you for taking time to come for your Medicare Wellness Visit. I appreciate your ongoing commitment to your health goals. Please review the following plan we discussed and let me know if I can assist you in the future.  ? ?Screening recommendations/referrals: ?Colonoscopy: completed 09/04/2016, due 09/04/2026 ?Mammogram: completed 04/04/2021, due 04/05/2022 ?Bone Density: completed 03/29/2020 ?Recommended yearly ophthalmology/optometry visit for glaucoma screening and checkup ?Recommended yearly dental visit for hygiene and checkup ? ?Vaccinations: ?Influenza vaccine: due 06/23/2022 ?Pneumococcal vaccine: completed 08/22/2021 ?Tdap vaccine: completed 01/05/2021, due 01/05/2031 ?Shingles vaccine: discussed   ?Covid-19: 08/22/2021, 08/30/2020, 01/04/2020, 12/14/2019 ? ?Advanced directives: Advance directive discussed with you today. ? ?Conditions/risks identified: none ? ?Next appointment: Follow up in one year for your annual wellness visit  ? ? ?Preventive Care 4 Years and Older, Female ?Preventive care refers to lifestyle choices and visits with your health care provider that can promote health and wellness. ?What does preventive care include? ?A yearly physical exam. This is also called an annual well check. ?Dental exams once or twice a year. ?Routine eye exams. Ask your health care provider how often you should have your eyes checked. ?Personal lifestyle choices, including: ?Daily care of your teeth and gums. ?Regular physical activity. ?Eating a healthy diet. ?Avoiding tobacco and drug use. ?Limiting alcohol use. ?Practicing safe sex. ?Taking low-dose aspirin every day. ?Taking vitamin and mineral supplements as recommended by your health care provider. ?What happens during an annual well check? ?The services and screenings done by your health care provider during your annual well check will depend on your age, overall health, lifestyle risk factors, and family history of disease. ?Counseling  ?Your  health care provider may ask you questions about your: ?Alcohol use. ?Tobacco use. ?Drug use. ?Emotional well-being. ?Home and relationship well-being. ?Sexual activity. ?Eating habits. ?History of falls. ?Memory and ability to understand (cognition). ?Work and work Statistician. ?Reproductive health. ?Screening  ?You may have the following tests or measurements: ?Height, weight, and BMI. ?Blood pressure. ?Lipid and cholesterol levels. These may be checked every 5 years, or more frequently if you are over 65 years old. ?Skin check. ?Lung cancer screening. You may have this screening every year starting at age 59 if you have a 30-pack-year history of smoking and currently smoke or have quit within the past 15 years. ?Fecal occult blood test (FOBT) of the stool. You may have this test every year starting at age 52. ?Flexible sigmoidoscopy or colonoscopy. You may have a sigmoidoscopy every 5 years or a colonoscopy every 10 years starting at age 35. ?Hepatitis C blood test. ?Hepatitis B blood test. ?Sexually transmitted disease (STD) testing. ?Diabetes screening. This is done by checking your blood sugar (glucose) after you have not eaten for a while (fasting). You may have this done every 1-3 years. ?Bone density scan. This is done to screen for osteoporosis. You may have this done starting at age 59. ?Mammogram. This may be done every 1-2 years. Talk to your health care provider about how often you should have regular mammograms. ?Talk with your health care provider about your test results, treatment options, and if necessary, the need for more tests. ?Vaccines  ?Your health care provider may recommend certain vaccines, such as: ?Influenza vaccine. This is recommended every year. ?Tetanus, diphtheria, and acellular pertussis (Tdap, Td) vaccine. You may need a Td booster every 10 years. ?Zoster vaccine. You may need this after age 54. ?Pneumococcal 13-valent conjugate (PCV13) vaccine. One dose is recommended after age  53. ?Pneumococcal  polysaccharide (PPSV23) vaccine. One dose is recommended after age 41. ?Talk to your health care provider about which screenings and vaccines you need and how often you need them. ?This information is not intended to replace advice given to you by your health care provider. Make sure you discuss any questions you have with your health care provider. ?Document Released: 12/06/2015 Document Revised: 07/29/2016 Document Reviewed: 09/10/2015 ?Elsevier Interactive Patient Education ? 2017 [place]. ? ?Fall Prevention in the Home ?Falls can cause injuries. They can happen to people of all ages. There are many things you can do to make your home safe and to help prevent falls. ?What can I do on the outside of my home? ?Regularly fix the edges of walkways and driveways and fix any cracks. ?Remove anything that might make you trip as you walk through a door, such as a raised step or threshold. ?Trim any bushes or trees on the path to your home. ?Use bright outdoor lighting. ?Clear any walking paths of anything that might make someone trip, such as rocks or tools. ?Regularly check to see if handrails are loose or broken. Make sure that both sides of any steps have handrails. ?Any raised decks and porches should have guardrails on the edges. ?Have any leaves, snow, or ice cleared regularly. ?Use sand or salt on walking paths during winter. ?Clean up any spills in your garage right away. This includes oil or grease spills. ?What can I do in the bathroom? ?Use night lights. ?Install grab bars by the toilet and in the tub and shower. Do not use towel bars as grab bars. ?Use non-skid mats or decals in the tub or shower. ?If you need to sit down in the shower, use a plastic, non-slip stool. ?Keep the floor dry. Clean up any water that spills on the floor as soon as it happens. ?Remove soap buildup in the tub or shower regularly. ?Attach bath mats securely with double-sided non-slip rug tape. ?Do not have throw  rugs and other things on the floor that can make you trip. ?What can I do in the bedroom? ?Use night lights. ?Make sure that you have a light by your bed that is easy to reach. ?Do not use any sheets or blankets that are too big for your bed. They should not hang down onto the floor. ?Have a firm chair that has side arms. You can use this for support while you get dressed. ?Do not have throw rugs and other things on the floor that can make you trip. ?What can I do in the kitchen? ?Clean up any spills right away. ?Avoid walking on wet floors. ?Keep items that you use a lot in easy-to-reach places. ?If you need to reach something above you, use a strong step stool that has a grab bar. ?Keep electrical cords out of the way. ?Do not use floor polish or wax that makes floors slippery. If you must use wax, use non-skid floor wax. ?Do not have throw rugs and other things on the floor that can make you trip. ?What can I do with my stairs? ?Do not leave any items on the stairs. ?Make sure that there are handrails on both sides of the stairs and use them. Fix handrails that are broken or loose. Make sure that handrails are as long as the stairways. ?Check any carpeting to make sure that it is firmly attached to the stairs. Fix any carpet that is loose or worn. ?Avoid having throw rugs at the top or  bottom of the stairs. If you do have throw rugs, attach them to the floor with carpet tape. ?Make sure that you have a light switch at the top of the stairs and the bottom of the stairs. If you do not have them, ask someone to add them for you. ?What else can I do to help prevent falls? ?Wear shoes that: ?Do not have high heels. ?Have rubber bottoms. ?Are comfortable and fit you well. ?Are closed at the toe. Do not wear sandals. ?If you use a stepladder: ?Make sure that it is fully opened. Do not climb a closed stepladder. ?Make sure that both sides of the stepladder are locked into place. ?Ask someone to hold it for you, if  possible. ?Clearly mark and make sure that you can see: ?Any grab bars or handrails. ?First and last steps. ?Where the edge of each step is. ?Use tools that help you move around (mobility aids) if they are nee

## 2022-04-21 ENCOUNTER — Ambulatory Visit: Payer: Medicare Other | Admitting: Nurse Practitioner

## 2022-04-22 ENCOUNTER — Encounter: Payer: Self-pay | Admitting: Nurse Practitioner

## 2022-04-22 ENCOUNTER — Ambulatory Visit (INDEPENDENT_AMBULATORY_CARE_PROVIDER_SITE_OTHER): Payer: Medicare Other | Admitting: Nurse Practitioner

## 2022-04-22 VITALS — Temp 98.2°F | Ht 65.0 in | Wt 211.0 lb

## 2022-04-22 DIAGNOSIS — R42 Dizziness and giddiness: Secondary | ICD-10-CM | POA: Diagnosis not present

## 2022-04-22 DIAGNOSIS — I10 Essential (primary) hypertension: Secondary | ICD-10-CM

## 2022-04-22 DIAGNOSIS — E1169 Type 2 diabetes mellitus with other specified complication: Secondary | ICD-10-CM

## 2022-04-22 DIAGNOSIS — H65191 Other acute nonsuppurative otitis media, right ear: Secondary | ICD-10-CM | POA: Diagnosis not present

## 2022-04-22 DIAGNOSIS — E669 Obesity, unspecified: Secondary | ICD-10-CM

## 2022-04-22 DIAGNOSIS — H6122 Impacted cerumen, left ear: Secondary | ICD-10-CM

## 2022-04-22 DIAGNOSIS — Z6835 Body mass index (BMI) 35.0-35.9, adult: Secondary | ICD-10-CM

## 2022-04-22 MED ORDER — AMOXICILLIN 875 MG PO TABS: 875.0000 mg | ORAL_TABLET | Freq: Two times a day (BID) | ORAL | 0 refills | Status: DC

## 2022-04-22 MED ORDER — VALSARTAN-HYDROCHLOROTHIAZIDE 320-25 MG PO TABS: 1.0000 | ORAL_TABLET | Freq: Every day | ORAL | 1 refills | Status: DC

## 2022-04-22 MED ORDER — FLUCONAZOLE 100 MG PO TABS: 100.0000 mg | ORAL_TABLET | Freq: Every day | ORAL | 0 refills | Status: DC

## 2022-04-22 NOTE — Progress Notes (Signed)
I,Tianna Badgett,acting as a Education administrator for [employer], FNP.,have documented all relevant documentation on the behalf of [name], FNP,as directed by  [name], FNP while in the presence of [name], [place].  This visit occurred during the SARS-CoV-2 public health emergency.  Safety protocols were in place, including screening questions prior to the visit, additional usage of staff PPE, and extensive cleaning of exam room while observing appropriate contact time as indicated for disinfecting solutions.  Subjective:     Patient ID: Candice Ayala , female    DOB: 01/28/1956 , 66 y.o.   MRN: [id number]   Chief Complaint  Patient presents with   Dizziness    HPI  Patient presents today for dizziness. Has had 2 episodes this month that caused her to be fearful. Reports her ears feel full underneath. Occurs spontaneously, she feels like she spinning. She only drinks 2 bottles of water. She has not taken her blood pressure medication today and not sure if she took it yesterday.   Dizziness This is a new problem. The current episode started more than 1 month ago. Associated symptoms include nausea. Pertinent negatives include no abdominal pain, chills, congestion, coughing, headaches, sore throat, vertigo or weakness.     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 Take 1 tablet by mouth now repeat in 5 days, Disp: 2 tablet, Rfl: 0    valsartan-hydrochlorothiazide (DIOVAN-HCT) 320-25 MG tablet, Take 1 tablet by mouth daily., Disp: 90 tablet, Rfl: 1   b complex vitamins capsule, Take 1 capsule by mouth daily., Disp: , Rfl:    BENADRYL ALLERGY 25 MG capsule, SMARTSIG:By Mouth, Disp: , Rfl:    cholecalciferol (VITAMIN D) 1000 UNITS tablet, Take 1,000 Units by mouth daily., Disp: , Rfl:    fluticasone (FLONASE) 50 MCG/ACT nasal spray, SPRAY 2 SPRAYS INTO EACH NOSTRIL EVERY DAY (Patient taking differently: Place 2 sprays into both nostrils daily as needed for allergies.), Disp: 48 mL, Rfl: 2   ibuprofen (ADVIL) 200 MG tablet, Take 200 mg by mouth every 6 (six) hours as needed for headache or moderate pain., Disp: , Rfl:    ibuprofen (ADVIL) 600 MG tablet, Take 1 tablet (600 mg total) by mouth every 6 (six) hours as needed for moderate pain. For AFTER surgery only, Disp: 30 tablet, Rfl: 0   loratadine (CLARITIN) 10 MG tablet, Take 10 mg by mouth daily as needed for allergies., Disp: , Rfl:    Multiple Vitamins-Minerals (MULTIVITAMIN WITH MINERALS) tablet, Take 1 tablet by mouth daily., Disp: , Rfl:    vitamin C (ASCORBIC ACID) 250 MG tablet, Take 250 mg by mouth daily., Disp: , Rfl:    Allergies  Allergen Reactions   Contrast Media [Iodinated Contrast Media] Hives     Review of Systems  Constitutional: Negative.  Negative for  chills.  HENT:  Negative for congestion and sore throat.   Respiratory: Negative.  Negative for cough.   Cardiovascular: Negative.   Gastrointestinal:  Positive for nausea. Negative for abdominal pain.  Neurological:  Positive for dizziness. Negative for vertigo, weakness and headaches.  Psychiatric/Behavioral: Negative.       Today's Vitals   04/22/22 1209  Temp: 98.2 F (36.8 C)  TempSrc: Oral  Weight: 211 lb (95.7 kg)  Height: _0  (1.651 m)   Body mass index is 35.11 kg/m.   Objective:  Physical Exam Vitals reviewed.  Constitutional:      General: She is not in acute distress.     Appearance: Normal appearance.  HENT:     Head: Normocephalic.  Cardiovascular:     Rate and Rhythm: Normal rate and regular rhythm.     Pulses: Normal pulses.  Pulmonary:     Effort: Pulmonary effort is normal. No respiratory distress.     Breath sounds: Normal breath sounds. No wheezing.  Lymphadenopathy:     Head:     Right side of head: Submental and submandibular adenopathy present.     Left side of head: Submental and submandibular adenopathy present.  Skin:    General: Skin is warm and dry.     Capillary Refill: Capillary refill takes less than 2 seconds.  Neurological:     General: No focal deficit present.     Mental Status: She is alert and oriented to person, place, and time.     Cranial Nerves: No cranial nerve deficit.     Motor: No weakness.  Psychiatric:        Mood and Affect: Mood normal.        Behavior: Behavior normal.        Thought Content: Thought content normal.        Judgment: Judgment normal.         Assessment And Plan:     1. Dizziness Comments: May be related to her elevated blood pressure vs dehydration vs sinus infection vs cerumen impaction. Encouraged to increase water intake. Orthos negative  2. Other non-recurrent acute nonsuppurative otitis media of right ear Comments: Erythema to canal and has lymphadenopathy.  - amoxicillin (AMOXIL) 875 MG tablet; Take 1 tablet (875 mg total) by mouth 2 (two) times daily.  Dispense: 14 tablet; Refill: 0 - fluconazole (DIFLUCAN) 100 MG tablet; Take 1 tablet (100 mg total) by mouth daily. Take 1 tablet by mouth now repeat in 5 days  Dispense: 2 tablet; Refill: 0  3. Impacted cerumen of left ear Comments: Ear lavage done with good results - Ear Lavage  4. Uncontrolled hypertension Comments: will increase valsartan/HCTZ and she will return in 2 weeks for [place] BP check. She is advised to take her medications daily for adherence - valsartan-hydrochlorothiazide (DIOVAN-HCT) 320-25 MG tablet; Take 1 tablet by  mouth daily.  Dispense: 90 tablet; Refill: 1  5. Diabetes mellitus type 2 in obese [facility]) Comments: Stable, no medications at this time.  - Hemoglobin A1c - BMP8+eGFR - Insulin, random - Urine microalbumin-creatinine with uACR     Patient was given opportunity to ask questions. Patient verbalized understanding of the plan and was able to repeat key elements of the plan. All questions were answered to their satisfaction.  [name], FNP   I, [name], FNP, have reviewed all documentation for this visit. The documentation on 04/22/22 for the exam, diagnosis, procedures, and orders are all accurate and complete.   IF YOU  HAVE BEEN REFERRED TO A SPECIALIST, IT MAY TAKE 1-2 WEEKS TO SCHEDULE/PROCESS THE REFERRAL. IF YOU HAVE NOT HEARD FROM US/SPECIALIST IN TWO WEEKS, PLEASE GIVE [place] A CALL AT [phone] X [phone].   THE PATIENT IS ENCOURAGED TO PRACTICE SOCIAL DISTANCING DUE TO THE COVID-19 PANDEMIC.

## 2022-04-22 NOTE — Patient Instructions (Signed)

## 2022-04-23 LAB — BMP8+EGFR
BUN/Creatinine Ratio: 16 (ref 12–28)
BUN: 10 mg/dL (ref 8–27)
CO2: 22 mmol/L (ref 20–29)
Calcium: 9.6 mg/dL (ref 8.7–10.3)
Chloride: 102 mmol/L (ref 96–106)
Creatinine, Ser: 0.63 mg/dL (ref 0.57–1.00)
Glucose: 87 mg/dL (ref 70–99)
Potassium: 4.1 mmol/L (ref 3.5–5.2)
Sodium: 140 mmol/L (ref 134–144)
eGFR: 97 mL/min/{1.73_m2} (ref >59)

## 2022-04-23 LAB — INSULIN, RANDOM: INSULIN: 12.8 u[IU]/mL (ref 2.6–24.9)

## 2022-04-23 LAB — HEMOGLOBIN A1C
Est. average glucose Bld gHb Est-mCnc: 128 mg/dL
Hgb A1c MFr Bld: 6.1 % — ABNORMAL HIGH (ref 4.8–5.6)

## 2022-05-07 ENCOUNTER — Ambulatory Visit: Payer: Medicare Other | Admitting: Internal Medicine

## 2022-05-11 ENCOUNTER — Ambulatory Visit: Payer: Medicare Other

## 2022-05-11 VITALS — BP 130/80 | HR 72 | Temp 97.8°F | Ht 65.0 in | Wt 211.0 lb

## 2022-05-11 DIAGNOSIS — I10 Essential (primary) hypertension: Secondary | ICD-10-CM

## 2022-05-11 NOTE — Progress Notes (Signed)
[name] She is currently taking valsartan-hctz 320-25.  BP Readings from Last 3 Encounters:  05/11/22 130/80  03/05/22 124/82  01/20/22 136/74   Pt will continue with current medications and follow up with provider in September.

## 2022-05-12 ENCOUNTER — Ambulatory Visit: Payer: 59

## 2022-07-28 ENCOUNTER — Ambulatory Visit: Payer: 59 | Admitting: Nurse Practitioner

## 2022-08-12 ENCOUNTER — Ambulatory Visit (INDEPENDENT_AMBULATORY_CARE_PROVIDER_SITE_OTHER): Payer: Medicare Other | Admitting: Nurse Practitioner

## 2022-08-12 ENCOUNTER — Encounter: Payer: Self-pay | Admitting: Nurse Practitioner

## 2022-08-12 VITALS — BP 130/82 | HR 74 | Temp 98.1°F | Wt 214.8 lb

## 2022-08-12 DIAGNOSIS — J3089 Other allergic rhinitis: Secondary | ICD-10-CM | POA: Diagnosis not present

## 2022-08-12 DIAGNOSIS — I10 Essential (primary) hypertension: Secondary | ICD-10-CM | POA: Diagnosis not present

## 2022-08-12 DIAGNOSIS — R591 Generalized enlarged lymph nodes: Secondary | ICD-10-CM | POA: Diagnosis not present

## 2022-08-12 MED ORDER — AMOXICILLIN-POT CLAVULANATE 875-125 MG PO TABS: 1.0000 | ORAL_TABLET | Freq: Two times a day (BID) | ORAL | 0 refills | Status: DC

## 2022-08-12 MED ORDER — VALSARTAN-HYDROCHLOROTHIAZIDE 320-25 MG PO TABS: 1.0000 | ORAL_TABLET | Freq: Every day | ORAL | 1 refills | Status: DC

## 2022-08-12 NOTE — Patient Instructions (Signed)
Earache, Adult ?An earache, or ear pain, can be caused by many things, including: ?An infection. ?Ear wax buildup. ?Ear pressure. ?Something in the ear that should not be there (foreign body). ?A sore throat. ?Tooth problems. ?Jaw problems. ?Treatment of the earache will depend on the cause. If the cause is not clear or cannot be determined, you may need to watch your symptoms until your earache goes away or until a cause is found. ?Follow these instructions at home: ?Medicines ?Take or apply over-the-counter and prescription medicines only as told by your health care provider. ?If you were prescribed an antibiotic medicine, use it as told by your health care provider. Do not stop using the antibiotic even if you start to feel better. ?Do not put anything in your ear other than medicine that is prescribed by your health care provider. ?Managing pain ?If directed, apply heat to the affected area as often as told by your health care provider. Use the heat source that your health care provider recommends, such as a moist heat pack or a heating pad. ?Place a towel between your skin and the heat source. ?Leave the heat on for 20-30 minutes. ?Remove the heat if your skin turns bright red. This is especially important if you are unable to feel pain, heat, or cold. You may have a greater risk of getting burned. ?If directed, put ice on the affected area as often as told by your health care provider. To do this: ? ?  ? ?Put ice in a plastic bag. ?Place a towel between your skin and the bag. ?Leave the ice on for 20 minutes, 2-3 times a day. ?General instructions ?Pay attention to any changes in your symptoms. ?Try resting in an upright position instead of lying down. This may help to reduce pressure in your ear and relieve pain. ?Chew gum if it helps to relieve your ear pain. ?Treat any allergies as told by your health care provider. ?Drink enough fluid to keep your urine pale yellow. ?It is up to you to get the results of  any tests that were done. Ask your health care provider, or the department that is doing the tests, when your results will be ready. ?Keep all follow-up visits as told by your health care provider. This is important. ?Contact a health care provider if: ?Your pain does not improve within 2 days. ?Your earache gets worse. ?You have new symptoms. ?You have a fever. ?Get help right away if you: ?Have a severe headache. ?Have a stiff neck. ?Have trouble swallowing. ?Have redness or swelling behind your ear. ?Have fluid or blood coming from your ear. ?Have hearing loss. ?Feel dizzy. ?Summary ?An earache, or ear pain, can be caused by many things. ?Treatment of the earache will depend on the cause. Follow recommendations from your health care provider to treat your ear pain. ?If the cause is not clear or cannot be determined, you may need to watch your symptoms until your earache goes away or until a cause is found. ?Keep all follow-up visits as told by your health care provider. This is important. ?This information is not intended to replace advice given to you by your health care provider. Make sure you discuss any questions you have with your health care provider. ?Document Revised: 06/16/2019 Document Reviewed: 06/17/2019 ?Elsevier Patient Education ? 2023 [employer] ? ?

## 2022-08-12 NOTE — Progress Notes (Signed)
I,Tianna Badgett,acting as a Education administrator for [employer], FNP.,have documented all relevant documentation on the behalf of [name], FNP,as directed by  [name], FNP while in the presence of [name], FNP   Subjective:     Patient ID: Candice Ayala , female    DOB: 1956-05-18 , 66 y.o.   MRN: [id number]   Chief Complaint  Patient presents with   Hypertension    HPI  Patient presents today for ear neck and jaw pain. Feels like it is swollen on the inside. Her sinuses across her nose having pain. Today is better. Symptoms started on Saturday. She had a flu vaccine on Saturday.   Otalgia  There is pain in the right ear. This is a new problem. There has been no fever. Associated symptoms include neck pain. Pertinent negatives include no diarrhea, ear discharge, headaches or sore throat. She has tried heat packs (Claritin and flonase) for the symptoms.  Hypertension This is a chronic problem. The current episode started more than 1 year ago. The problem is unchanged. The problem is controlled. Associated symptoms include neck pain. Pertinent negatives include no anxiety, blurred vision, chest pain, headaches, malaise/fatigue, palpitations or peripheral edema. There are no associated agents to hypertension. Risk factors for coronary artery disease include obesity and sedentary lifestyle. Past treatments include angiotensin blockers and diuretics. There are no compliance problems.  There is no history of angina or kidney disease. There is no history of chronic renal disease.     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   PONV (postoperative nausea and vomiting)      Family History  Problem Relation Age of Onset   Cancer Mother        colon   Early death Father    Hypertension Brother    Congestive Heart Failure Brother      Current Outpatient Medications:     amoxicillin-clavulanate (AUGMENTIN) 875-125 MG tablet, Take 1 tablet by mouth 2 (two) times daily., Disp: 14 tablet, Rfl: 0   b complex vitamins capsule, Take 1 capsule by mouth daily., Disp: , Rfl:    BENADRYL ALLERGY 25 MG capsule, SMARTSIG:By Mouth, Disp: , Rfl:    cholecalciferol (VITAMIN D) 1000 UNITS tablet, Take 1,000 Units by mouth daily., Disp: , Rfl:    ibuprofen (ADVIL) 200 MG tablet, Take 200 mg by mouth every 6 (six) hours as needed for headache or moderate pain., Disp: , Rfl:    ibuprofen (ADVIL) 600 MG tablet, Take 1 tablet (600 mg total) by mouth every 6 (six) hours as needed for moderate pain. For AFTER surgery only, Disp: 30 tablet, Rfl: 0   loratadine (CLARITIN) 10 MG tablet, Take 10 mg by mouth daily as needed for allergies., Disp: , Rfl:    Multiple Vitamins-Minerals (MULTIVITAMIN WITH MINERALS) tablet, Take 1 tablet by mouth daily., Disp: , Rfl:    vitamin C (ASCORBIC ACID) 250 MG tablet, Take 250 mg by mouth daily., Disp: , Rfl:    valsartan-hydrochlorothiazide (DIOVAN-HCT) 320-25 MG tablet, Take 1 tablet by mouth daily., Disp: 90 tablet, Rfl: 1   Allergies  Allergen Reactions   Contrast Media [Iodinated Contrast Media] Hives     Review of Systems  Constitutional:  Negative for malaise/fatigue.  HENT:  Positive for ear pain. Negative for ear discharge and sore throat.   Eyes:  Negative for blurred vision.  Cardiovascular:  Negative for chest pain and palpitations.  Gastrointestinal:  Negative for diarrhea.  Musculoskeletal:  Positive for neck pain.  Neurological:  Negative for headaches.     Today's Vitals   08/12/22 1203  BP: 130/82  Pulse: 74  Temp: 98.1 F (36.7 C)  TempSrc: Oral  Weight: 214 lb 12.8 oz (97.4 kg)   Body mass index is 35.74 kg/m.  Wt Readings from Last 3 Encounters:  08/12/22 214 lb 12.8 oz (97.4 kg)  05/11/22 211 lb (95.7 kg)  04/22/22 211 lb (95.7 kg)    Objective:  Physical Exam Vitals reviewed.  Constitutional:      General:  She is not in acute distress.    Appearance: Normal appearance.  HENT:     Head: Normocephalic.  Cardiovascular:     Rate and Rhythm: Normal rate and regular rhythm.     Pulses: Normal pulses.  Pulmonary:     Effort: Pulmonary effort is normal. No respiratory distress.     Breath sounds: Normal breath sounds. No wheezing.  Lymphadenopathy:     Head:     Right side of head: Submandibular adenopathy present.     Left side of head: Submandibular adenopathy present.  Skin:    General: Skin is warm and dry.     Capillary Refill: Capillary refill takes less than 2 seconds.  Neurological:     General: No focal deficit present.     Mental Status: She is alert and oriented to person, place, and time.     Cranial Nerves: No cranial nerve deficit.     Motor: No weakness.  Psychiatric:        Mood and Affect: Mood normal.        Behavior: Behavior normal.        Thought Content: Thought content normal.        Judgment: Judgment normal.         Assessment And Plan:     1. Essential hypertension Comments: Blood pressure is fairly controlled, continue current medications - valsartan-hydrochlorothiazide (DIOVAN-HCT) 320-25 MG tablet; Take 1 tablet by mouth daily.  Dispense: 90 tablet; Refill: 1 - BMP8+eGFR  2. Non-seasonal allergic rhinitis, unspecified trigger Comments: Will treat for possible sinus infection since has submandibular lymphadenopathy - amoxicillin-clavulanate (AUGMENTIN) 875-125 MG tablet; Take 1 tablet by mouth 2 (two) times daily.  Dispense: 14 tablet; Refill: 0  3. Lymphadenopathy Submandibular lymphadenopathy   Patient was given opportunity to ask questions. Patient verbalized understanding of the plan and was able to repeat key elements of the plan. All questions were answered to their satisfaction.  [name], FNP   I, [name], FNP, have reviewed all documentation for this visit. The documentation on 08/12/22 for the exam, diagnosis, procedures, and  orders are all accurate and complete.   IF YOU HAVE BEEN REFERRED TO A SPECIALIST, IT MAY TAKE 1-2 WEEKS TO SCHEDULE/PROCESS THE REFERRAL. IF YOU HAVE NOT HEARD FROM US/SPECIALIST IN TWO WEEKS, PLEASE GIVE [place] A CALL AT [phone].   THE PATIENT IS ENCOURAGED TO PRACTICE SOCIAL DISTANCING DUE TO THE COVID-19 PANDEMIC.

## 2022-08-13 LAB — BMP8+EGFR
BUN/Creatinine Ratio: 18 (ref 12–28)
BUN: 14 mg/dL (ref 8–27)
CO2: 25 mmol/L (ref 20–29)
Calcium: 9.7 mg/dL (ref 8.7–10.3)
Chloride: 100 mmol/L (ref 96–106)
Creatinine, Ser: 0.8 mg/dL (ref 0.57–1.00)
Glucose: 107 mg/dL — ABNORMAL HIGH (ref 70–99)
Potassium: 4.2 mmol/L (ref 3.5–5.2)
Sodium: 141 mmol/L (ref 134–144)
eGFR: 80 mL/min/{1.73_m2} (ref >59)

## 2022-10-29 ENCOUNTER — Other Ambulatory Visit: Payer: Self-pay

## 2022-11-02 ENCOUNTER — Telehealth: Payer: Self-pay | Admitting: Hematology and Oncology

## 2022-11-02 NOTE — Telephone Encounter (Signed)
Left message for patient to return my call in reference to upcoming clinic appointment on 12/20 at 815

## 2022-11-02 NOTE — Telephone Encounter (Signed)
Spoke to patient to confirm upcoming morning [facility] clinic appointment on 12/20, paperwork will be sent via [place].   Gave location and time, also informed patient that the surgeon's office would be calling as well to get information from them similar to the packet that they will be receiving so make sure to do both.  Reminded patient that all providers will be coming to the clinic to see them HERE and if they had any questions to not hesitate to reach back out to myself or their navigators.

## 2022-11-03 ENCOUNTER — Encounter: Payer: Self-pay | Admitting: Internal Medicine

## 2022-11-09 ENCOUNTER — Encounter: Payer: Self-pay | Admitting: *Deleted

## 2022-11-09 DIAGNOSIS — D0511 Intraductal carcinoma in situ of right breast: Secondary | ICD-10-CM | POA: Insufficient documentation

## 2022-11-09 NOTE — Progress Notes (Signed)
Radiation Oncology         [phone] ________________________________  Name: Candice Ayala        MRN: [id number]  Date of Service: 11/11/2022 DOB: [birth year]  [id number], Bailey Mech, MD  Coralie Keens, MD     REFERRING PHYSICIAN: Coralie Keens, MD   DIAGNOSIS: The encounter diagnosis was Ductal carcinoma in situ (DCIS) of right breast.   HISTORY OF PRESENT ILLNESS: Candice Ayala is a 66 y.o. female seen in the multidisciplinary breast clinic for a new diagnosis of right breast cancer. The patient was noted to have' \\screening'$  detected calcifications in the right breast earlier this year which were seen in the, lateral upper quadrant and an 8 mm mass in the left breast, upper quadrant.  She underwent further diagnostic imaging, and the findings in the left breast were consistent with a skin lesion and were felt to be benign.  She did undergo further diagnostic imaging on 10/20/2022 which showed 2 separate groups of pleomorphic calcifications in the right breast in the upper outer quadrant measuring 1.6 cm and 1.5 cm.  These sites were 1.4 cm apart.  The more anterior group had increased in the interval.  She underwent stereotactic biopsies of the right breast on 10/29/2022 which showed intermediate grade DCIS in both of the specimens.  Her cancer was ER/PR positive.  She is seen today to discuss treatment recommendations of her cancer.  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   PONV (postoperative nausea and vomiting)        PAST SURGICAL HISTORY: Past Surgical History:  Procedure Laterality Date   BREAST BIOPSY Right 11/27/2013   Procedure: RIGHT BREAST BIOPSY WITH NEEDLE LOCALIZATION;  Surgeon: Odis Hollingshead, MD;  Location: [facility];  Service: General;  Laterality: Right;    COLONOSCOPY     JOINT REPLACEMENT  2004   right total hip   LYSIS OF ADHESION N/A 10/01/2020   Procedure: LYSIS OF ADHESION;  Surgeon: Everitt Amber, MD;  Location: [facility];  Service: Gynecology;  Laterality: N/A;   OVARIAN CYST REMOVAL  1980   ROBOTIC ASSISTED TOTAL HYSTERECTOMY WITH BILATERAL SALPINGO OOPHERECTOMY N/A 10/01/2020   Procedure: XI ROBOTIC ASSISTED TOTAL HYSTERECTOMY WITH UNILATERL  SALPINGO OOPHORECTOMY;  Surgeon: Everitt Amber, MD;  Location: [facility];  Service: Gynecology;  Laterality: N/A;   TOTAL HIP ARTHROPLASTY Right 2004   TOTAL HIP ARTHROPLASTY Left 02/12/2022     FAMILY HISTORY:  Family History  Problem Relation Age of Onset   Cancer Mother        colon   Early death Father    Hypertension Brother    Congestive Heart Failure Brother      SOCIAL HISTORY:  reports that she has never smoked. She has never used smokeless tobacco. She reports that she does not drink alcohol and does not use drugs.   ALLERGIES: Contrast media [iodinated contrast media]   MEDICATIONS:  Current Outpatient Medications  Medication Sig Dispense Refill   amoxicillin-clavulanate (AUGMENTIN) 875-125 MG tablet Take 1 tablet by mouth 2 (two) times daily. 14 tablet 0   b complex vitamins capsule Take 1 capsule by mouth daily.     BENADRYL ALLERGY 25 MG capsule SMARTSIG:By Mouth     cholecalciferol (VITAMIN D) 1000 UNITS tablet Take 1,000 Units by mouth daily.  ibuprofen (ADVIL) 200 MG tablet Take 200 mg by mouth every 6 (six) hours as needed for headache or moderate pain.     ibuprofen (ADVIL) 600 MG tablet Take 1 tablet (600 mg total) by mouth every 6 (six) hours as needed for moderate pain. For AFTER surgery only 30 tablet 0   loratadine (CLARITIN) 10 MG tablet Take 10 mg by mouth daily as needed for allergies.     Multiple Vitamins-Minerals (MULTIVITAMIN WITH MINERALS) tablet Take 1 tablet by mouth daily.     valsartan-hydrochlorothiazide (DIOVAN-HCT) 320-25 MG tablet Take 1 tablet by  mouth daily. 90 tablet 1   vitamin C (ASCORBIC ACID) 250 MG tablet Take 250 mg by mouth daily.     No current facility-administered medications for this visit.     REVIEW OF SYSTEMS: On review of systems, the patient reports that she is doing ***     PHYSICAL EXAM:  Wt Readings from Last 3 Encounters:  08/12/22 214 lb 12.8 oz (97.4 kg)  05/11/22 211 lb (95.7 kg)  04/22/22 211 lb (95.7 kg)   Temp Readings from Last 3 Encounters:  08/12/22 98.1 F (36.7 C) (Oral)  05/11/22 97.8 F (36.6 C) (Oral)  04/22/22 98.2 F (36.8 C) (Oral)   BP Readings from Last 3 Encounters:  08/12/22 130/82  05/11/22 130/80  03/05/22 124/82   Pulse Readings from Last 3 Encounters:  08/12/22 74  05/11/22 72  03/05/22 72    In general this is a well appearing *** female in no acute distress. She's alert and oriented x4 and appropriate throughout the examination. Cardiopulmonary assessment is negative for acute distress and she exhibits normal effort. Bilateral breast exam is deferred.    ECOG = ***  0 - Asymptomatic (Fully active, able to carry on all predisease activities without restriction)  1 - Symptomatic but completely ambulatory (Restricted in physically strenuous activity but ambulatory and able to carry out work of a light or sedentary nature. For example, light housework, office work)  2 - Symptomatic, <50% in bed during the day (Ambulatory and capable of all self care but unable to carry out any work activities. Up and about more than 50% of waking hours)  3 - Symptomatic, >50% in bed, but not bedbound (Capable of only limited self-care, confined to bed or chair 50% or more of waking hours)  4 - Bedbound (Completely disabled. Cannot carry on any self-care. Totally confined to bed or chair)  5 - Death   Eustace Pen MM, Creech RH, Tormey DC, et al. [phone]). "Toxicity and response criteria of the [facility] Group". [place] Oncol. 5 (6): 649-55    LABORATORY DATA:   Lab Results  Component Value Date   WBC 5.6 01/05/2022   HGB 12.5 01/05/2022   HCT 37.6 01/05/2022   MCV 85 01/05/2022   PLT 399 01/05/2022   Lab Results  Component Value Date   NA 141 08/12/2022   K 4.2 08/12/2022   CL 100 08/12/2022   CO2 25 08/12/2022   Lab Results  Component Value Date   ALT 17 09/20/2020   AST 19 09/20/2020   ALKPHOS 59 09/20/2020   BILITOT 0.9 09/20/2020      RADIOGRAPHY: No results found.     IMPRESSION/PLAN: 1. Intermediate Grade, ER/PR positive DCIS of the right breast. Dr. Lisbeth Renshaw discusses the pathology findings and reviews the nature of early stage right breast disease. The consensus from the breast conference includes breast conservation with lumpectomy. Dr. Lisbeth Renshaw recommends  external radiotherapy to the breast  to reduce risks of local recurrence followed by antiestrogen therapy. We discussed the risks, benefits, short, and long term effects of radiotherapy, as well as the curative intent, and the patient is interested in proceeding. Dr. Lisbeth Renshaw discusses the delivery and logistics of radiotherapy and anticipates a course of 4 weeks of radiotherapy to the right breast. We will see her back a few weeks after surgery to discuss the simulation process and anticipate we starting radiotherapy about 4-6 weeks after surgery.  2. Possible genetic predisposition to malignancy. The patient is a candidate for genetic testing given her personal and family history. She will meet with our geneticist today in clinic.   In a visit lasting *** minutes, greater than 50% of the time was spent face to face reviewing her case, as well as in preparation of, discussing, and coordinating the patient's care.  The above documentation reflects my direct findings during this shared patient visit. Please see the separate note by Dr. Lisbeth Renshaw on this date for the remainder of the patient's plan of care.    Carola Rhine, [facility]    **Disclaimer: This note was dictated with voice  recognition software. Similar sounding words can inadvertently be transcribed and this note may contain transcription errors which may not have been corrected upon publication of note.**

## 2022-11-11 ENCOUNTER — Ambulatory Visit: Payer: Medicare Other | Admitting: Physical Therapy

## 2022-11-11 ENCOUNTER — Inpatient Hospital Stay: Payer: Medicare Other | Attending: Hematology

## 2022-11-11 ENCOUNTER — Ambulatory Visit
Admission: RE | Admit: 2022-11-11 | Discharge: 2022-11-11 | Disposition: A | Discharge status: Home / Self Care | Payer: Medicare Other | Source: Ambulatory Visit | Attending: Radiation Oncology | Admitting: Radiation Oncology

## 2022-11-11 ENCOUNTER — Telehealth: Payer: Self-pay | Admitting: Genetic Counselor

## 2022-11-11 ENCOUNTER — Other Ambulatory Visit: Payer: Self-pay | Admitting: Surgery

## 2022-11-11 ENCOUNTER — Inpatient Hospital Stay: Payer: Medicare Other | Admitting: Licensed Clinical Social Worker

## 2022-11-11 ENCOUNTER — Encounter: Payer: Self-pay | Admitting: Hematology

## 2022-11-11 ENCOUNTER — Other Ambulatory Visit: Payer: Self-pay

## 2022-11-11 ENCOUNTER — Inpatient Hospital Stay (HOSPITAL_BASED_OUTPATIENT_CLINIC_OR_DEPARTMENT_OTHER): Payer: Medicare Other | Admitting: Hematology

## 2022-11-11 ENCOUNTER — Encounter: Payer: Self-pay | Admitting: *Deleted

## 2022-11-11 VITALS — BP 167/91 | HR 77 | Temp 98.5°F | Resp 18 | Ht 65.0 in | Wt 216.6 lb

## 2022-11-11 DIAGNOSIS — Z800 Family history of malignant neoplasm of digestive organs: Secondary | ICD-10-CM

## 2022-11-11 DIAGNOSIS — E042 Nontoxic multinodular goiter: Secondary | ICD-10-CM | POA: Insufficient documentation

## 2022-11-11 DIAGNOSIS — Z90722 Acquired absence of ovaries, bilateral: Secondary | ICD-10-CM

## 2022-11-11 DIAGNOSIS — N950 Postmenopausal bleeding: Secondary | ICD-10-CM

## 2022-11-11 DIAGNOSIS — Z90710 Acquired absence of both cervix and uterus: Secondary | ICD-10-CM

## 2022-11-11 DIAGNOSIS — Z91041 Radiographic dye allergy status: Secondary | ICD-10-CM | POA: Insufficient documentation

## 2022-11-11 DIAGNOSIS — D0511 Intraductal carcinoma in situ of right breast: Secondary | ICD-10-CM

## 2022-11-11 DIAGNOSIS — Z79899 Other long term (current) drug therapy: Secondary | ICD-10-CM

## 2022-11-11 DIAGNOSIS — I10 Essential (primary) hypertension: Secondary | ICD-10-CM | POA: Insufficient documentation

## 2022-11-11 DIAGNOSIS — Z85850 Personal history of malignant neoplasm of thyroid: Secondary | ICD-10-CM

## 2022-11-11 DIAGNOSIS — Z8249 Family history of ischemic heart disease and other diseases of the circulatory system: Secondary | ICD-10-CM | POA: Diagnosis not present

## 2022-11-11 LAB — CBC WITH DIFFERENTIAL (CANCER CENTER ONLY)
Abs Immature Granulocytes: 0.01 10*3/uL (ref 0.00–0.07)
Basophils Absolute: 0 10*3/uL (ref 0.0–0.1)
Basophils Relative: 0 %
Eosinophils Absolute: 0.1 10*3/uL (ref 0.0–0.5)
Eosinophils Relative: 1 %
HCT: 37.8 % (ref 36.0–46.0)
Hemoglobin: 12.6 g/dL (ref 12.0–15.0)
Immature Granulocytes: 0 %
Lymphocytes Relative: 44 %
Lymphs Abs: 2.9 10*3/uL (ref 0.7–4.0)
MCH: 28.8 pg (ref 26.0–34.0)
MCHC: 33.3 g/dL (ref 30.0–36.0)
MCV: 86.3 fL (ref 80.0–100.0)
Monocytes Absolute: 0.4 10*3/uL (ref 0.1–1.0)
Monocytes Relative: 6 %
Neutro Abs: 3.2 10*3/uL (ref 1.7–7.7)
Neutrophils Relative %: 49 %
Platelet Count: 363 10*3/uL (ref 150–400)
RBC: 4.38 MIL/uL (ref 3.87–5.11)
RDW: 13.9 % (ref 11.5–15.5)
WBC Count: 6.5 10*3/uL (ref 4.0–10.5)
nRBC: 0 % (ref 0.0–0.2)

## 2022-11-11 LAB — CMP (CANCER CENTER ONLY)
ALT: 15 U/L (ref 0–44)
AST: 18 U/L (ref 15–41)
Albumin: 3.6 g/dL (ref 3.5–5.0)
Alkaline Phosphatase: 81 U/L (ref 38–126)
Anion gap: 7 (ref 5–15)
BUN: 12 mg/dL (ref 8–23)
CO2: 28 mmol/L (ref 22–32)
Calcium: 9.8 mg/dL (ref 8.9–10.3)
Chloride: 106 mmol/L (ref 98–111)
Creatinine: 0.78 mg/dL (ref 0.44–1.00)
GFR, Estimated: >60 mL/min (ref >60)
Glucose, Bld: 128 mg/dL — ABNORMAL HIGH (ref 70–99)
Potassium: 4.2 mmol/L (ref 3.5–5.1)
Sodium: 141 mmol/L (ref 135–145)
Total Bilirubin: 0.7 mg/dL (ref 0.3–1.2)
Total Protein: 7.5 g/dL (ref 6.5–8.1)

## 2022-11-11 LAB — GENETIC SCREENING ORDER

## 2022-11-11 NOTE — Telephone Encounter (Signed)
Candice Ayala was seen by a genetic counselor during the breast multidisciplinary clinic on 11/11/2022. In addition to her personal history of breast cancer, she reported her mother was diagnosed with colon cancer at an unknown age (>50) and her maternal grandmother was diagnosed with breast cancer at an unknown age (>50). She does not meet NCCN criteria for genetic testing at this time. She was still offered genetic counseling and testing but declined. We encourage her to contact us if there are any changes to her personal or family history of cancer. If she meets NCCN criteria based on the updated personal/family history, she would be recommended to have genetic counseling and testing.   Lucille Passy, MS, [facility] Genetic Counselor [email or web address] (P) [phone]

## 2022-11-11 NOTE — Progress Notes (Signed)
[place]   [phone] Fax:([phone]) [place] Note   Patient Care Team: Glendale Chard, MD as PCP - General (Internal Medicine) Rockwell Germany, RN as Oncology Nurse Navigator Rockwell Germany, RN as Oncology Nurse Navigator Coralie Keens, MD as Consulting Physician (General Surgery) Truitt Merle, MD as Consulting Physician (Hematology) Kyung Rudd, MD as Consulting Physician (Radiation Oncology)  Date of Service:  11/11/2022   CHIEF COMPLAINTS/PURPOSE OF CONSULTATION:   Breast Cancer, ER DCIS  REFERRING PHYSICIAN:  [facility]  ASSESSMENT & PLAN:  CATILYN BOGGUS is a 66 y.o. postmenopausal woman, presented with screening discovered to DCIS.  1. Right breast DCIS, grade 2, ER+ /PR+ -I discussed her breast imaging and needle biopsy results with patient and her family members in great detail. -She is a candidate for breast conservation surgery. She has been seen by breast surgeon Dr. Ninfa Linden, who recommends lumpectomy. -Her DCIS will be cured by complete surgical resection. Any form of adjuvant therapy is preventive. -I used the DCIS nomogram from [place], and calculated her risk of future breast cancer (17% in 10 years) and the benefit of adjuvant radiation or antiestrogen therapy. -Given her strongly positive/negative ER and PR, I do/not recommend antiestrogen therapy with low dose Tamoxifen '5mg'$  daily for 3 years, which decrease her risk of future breast cancer by ~50%.  I reviewed the potential side effect of tamoxifen, which is much lower in low-dose, she voiced good understanding and is interested.  -She will likely benefit from breast radiation if she undergo lumpectomy to decrease the risk of breast cancer.  She was seen by radiation oncologist Dr. Lisbeth Renshaw today. -Patient is 66 year old, but overall healthy and active.  She is open to both radiation and antiestrogen therapy. -We also discussed that  biopsy may have sampling limitation, we will review her surgical path, to see if she has any invasive carcinoma components. -We discussed breast cancer surveillance after she completes treatment, Including annual mammogram, breast exam every 6-12 months. -Latest lab results reviewed with patient.  2. Metastatic papillary thyroid carcinoma in left ovary -Patient had hysterectomy and BSO in November 2021 for postmenopausal vaginal bleeding.  I reviewed the pathology findings, which incidentally showed metastatic papillary thyroid carcinoma in the left ovary  -Patient underwent ultrasound of the thyroid after surgery which showed bilateral nodules, she underwent a FNA of thyroid nodule which was negative for malignant cells.   -She has not had any follow-up or workup after that, I will refer her to endocrinology. -We also discussed that she may need total thyroidectomy.  Dr. Ninfa Linden can perform that surgery if needed.   Plan -She will likely have lumpectomy soon -Genetic refer -endocrine referral for history of thyroid cancer  -I'll see her the last week finalize her antiestrogen therapy.     PLAN:  - Lab reviewed  - Discuss Surgery,Radiation and antiestrogen -No chemotherapy - Recommend Tamoxifen 3 years -referral to Endocrinology   Oncology History  Ductal carcinoma in situ (DCIS) of right breast  11/09/2022 Initial Diagnosis   Ductal carcinoma in situ (DCIS) of right breast    Imaging        HISTORY OF PRESENTING ILLNESS:  RASHEEN SCHEWE 66 y.o. female is a here because of breast cancer. The patient was referred by [facility]. The patient presents to the clinic today accompanied by family friend.  She had routine screening mammography on 10/20/22 showing a possible abnormality in the right breast.She underwent right diagnostic  mammography on October 20, 2022, which showed 2 groups of calcifications in the right breast measuring 1.5 and 1.6 cm, which is for malignancy.  She  underwent a biopsy on 10/20/22 showed: Grade 2 DCIS, no necrosis, ER and PR 95% positive..  Today the patient notes they she feels nervous about her cancer diagnosis.  She denies any pain, or any discomfort.  Her appetite, energy level has not changed lately.  Her weight is stable.   She has a PMHx of right breast surgery for benign disease in 2015.  She also has hypertension, controlled.  She had total hysterectomy for post menopause vaginal bleeding by Dr. Denman George in 2021.   Socially... Divorce and No children.     GYN HISTORY  Menarchal: 10 LMP: 45-50 Contraceptive: none  HRT: none  GP:0   REVIEW OF SYSTEMS:    Constitutional: Denies fevers, chills or abnormal night sweats Eyes: Denies blurriness of vision, double vision or watery eyes Ears, nose, mouth, throat, and face: Denies mucositis or sore throat Respiratory: Denies cough, dyspnea or wheezes Cardiovascular: Denies palpitation, chest discomfort or lower extremity swelling Gastrointestinal:  Denies nausea, heartburn or change in bowel habits Skin: Denies abnormal skin rashes Lymphatics: Denies new lymphadenopathy or easy bruising Neurological:Denies numbness, tingling or new weaknesses Behavioral/Psych: Mood is stable, no new changes  All other systems were reviewed with the patient and are negative.   MEDICAL HISTORY:  Past Medical History:  Diagnosis Date   Allergy    Arthritis    Breast cancer ([place])    Hypertension    Malignant struma ovarii of left ovary ([place]) 10/22/2020   PONV (postoperative nausea and vomiting)     SURGICAL HISTORY: Past Surgical History:  Procedure Laterality Date   BREAST BIOPSY Right 11/27/2013   Procedure: RIGHT BREAST BIOPSY WITH NEEDLE LOCALIZATION;  Surgeon: Odis Hollingshead, MD;  Location: [place];  Service: General;  Laterality: Right;   COLONOSCOPY     JOINT REPLACEMENT  2004   right total hip   LYSIS OF ADHESION N/A 10/01/2020   Procedure: LYSIS OF  ADHESION;  Surgeon: Everitt Amber, MD;  [facility];  Service: Gynecology;  Laterality: N/A;   OVARIAN CYST REMOVAL  1980   ROBOTIC ASSISTED TOTAL HYSTERECTOMY WITH BILATERAL SALPINGO OOPHERECTOMY N/A 10/01/2020   Procedure: XI ROBOTIC ASSISTED TOTAL HYSTERECTOMY WITH UNILATERL  SALPINGO OOPHORECTOMY;  Surgeon: Everitt Amber, MD;  [facility];  Service: Gynecology;  Laterality: N/A;   TOTAL HIP ARTHROPLASTY Right 2004   TOTAL HIP ARTHROPLASTY Left 02/12/2022    SOCIAL HISTORY: Social History   Socioeconomic History   Marital status: Divorced    Spouse name: Not on file   Number of children: 0   Years of education: Not on file   Highest education level: Not on file  Occupational History   Not on file  Tobacco Use   Smoking status: Never   Smokeless tobacco: Never   Tobacco comments:    She is a non-smoker  Vaping Use   Vaping Use: Never used  Substance and Sexual Activity   Alcohol use: No   Drug use: No   Sexual activity: Not Currently  Other Topics Concern   Not on file  Social History Narrative   Not on file   Social Determinants of Health   Financial Resource Strain: Low Risk  (11/11/2022)   Overall Financial Resource Strain (CARDIA)    Difficulty of Paying Living Expenses: Not very hard  Food Insecurity: No Food  Insecurity (11/11/2022)   Hunger Vital Sign    Worried About Running Out of Food in the Last Year: Never true    [place] in the Last Year: Never true  Transportation Needs: No Transportation Needs (11/11/2022)   PRAPARE - Hydrologist (Medical): No    Lack of Transportation (Non-Medical): No  Physical Activity: Sufficiently Active (03/11/2022)   Exercise Vital Sign    Days of Exercise per Week: 7 days    Minutes of Exercise per Session: 30 min  Stress: No Stress Concern Present (03/11/2022)   [place]    Feeling of Stress : Not at all  Social  Connections: Not on file  Intimate Partner Violence: Not on file    FAMILY HISTORY: Family History  Problem Relation Age of Onset   Cancer Mother        colon   Early death Father    Hypertension Brother    Congestive Heart Failure Brother     ALLERGIES:  is allergic to contrast media [iodinated contrast media].  MEDICATIONS:  Current Outpatient Medications  Medication Sig Dispense Refill   b complex vitamins capsule Take 1 capsule by mouth daily.     BENADRYL ALLERGY 25 MG capsule SMARTSIG:By Mouth     cholecalciferol (VITAMIN D) 1000 UNITS tablet Take 1,000 Units by mouth daily.     ibuprofen (ADVIL) 200 MG tablet Take 200 mg by mouth every 6 (six) hours as needed for headache or moderate pain.     ibuprofen (ADVIL) 600 MG tablet Take 1 tablet (600 mg total) by mouth every 6 (six) hours as needed for moderate pain. For AFTER surgery only 30 tablet 0   loratadine (CLARITIN) 10 MG tablet Take 10 mg by mouth daily as needed for allergies.     Multiple Vitamins-Minerals (MULTIVITAMIN WITH MINERALS) tablet Take 1 tablet by mouth daily.     valsartan-hydrochlorothiazide (DIOVAN-HCT) 320-25 MG tablet Take 1 tablet by mouth daily. 90 tablet 1   vitamin C (ASCORBIC ACID) 250 MG tablet Take 250 mg by mouth daily.     No current facility-administered medications for this visit.    PHYSICAL EXAMINATION: ECOG PERFORMANCE STATUS: 0 - Asymptomatic  Vitals:   11/11/22 0848  BP: (!) 167/91  Pulse: 77  Resp: 18  Temp: 98.5 F (36.9 C)  SpO2: 100%   Filed Weights   11/11/22 0848  Weight: 216 lb 9.6 oz (98.2 kg)    GENERAL:alert, no distress and comfortable SKIN: skin color, texture, turgor are normal, no rashes or significant lesions EYES: normal, Conjunctiva are pink and non-injected, sclera clear  NECK: supple, thyroid normal size, non-tender, without nodularity LYMPH: (-) no palpable lymphadenopathy in the cervical, axillary  LUNGS: clear to auscultation and percussion with  normal breathing effort HEART: regular rate & rhythm and no murmurs and no lower extremity edema ABDOMEN:abdomen soft, non-tender and normal bowel sounds Musculoskeletal:no cyanosis of digits and no clubbing  NEURO: alert & oriented x 3 with fluent speech, no focal motor/sensory deficits BREAST:  Left  Breast No palpable mass,  right breast (+) lump bleeding  where biopsy nodules or adenopathy bilaterally. Breast exam benign.  LABORATORY DATA:  I have reviewed the data as listed    Latest Ref Rng & Units 11/11/2022    8:24 AM 01/05/2022   12:32 PM 09/20/2020    8:50 AM  CBC  WBC 4.0 - 10.5 K/uL 6.5  5.6  6.3   Hemoglobin 12.0 - 15.0 g/dL 12.6  12.5  12.6   Hematocrit 36.0 - 46.0 % 37.8  37.6  38.8   Platelets 150 - 400 K/uL 363  399  354        Latest Ref Rng & Units 11/11/2022    8:24 AM 08/12/2022    2:36 PM 04/22/2022    3:45 PM  CMP  Glucose 70 - 99 mg/dL 128  107  87   BUN 8 - 23 mg/dL '12  14  10   '$ Creatinine 0.44 - 1.00 mg/dL 0.78  0.80  0.63   Sodium 135 - 145 mmol/L 141  141  140   Potassium 3.5 - 5.1 mmol/L 4.2  4.2  4.1   Chloride 98 - 111 mmol/L 106  100  102   CO2 22 - 32 mmol/L '28  25  22   '$ Calcium 8.9 - 10.3 mg/dL 9.8  9.7  9.6   Total Protein 6.5 - 8.1 g/dL 7.5     Total Bilirubin 0.3 - 1.2 mg/dL 0.7     Alkaline Phos 38 - 126 U/L 81     AST 15 - 41 U/L 18     ALT 0 - 44 U/L 15        RADIOGRAPHIC STUDIES: I have personally reviewed the radiological images as listed and agreed with the findings in the report. No results found.   Orders Placed This Encounter  Procedures   Ambulatory referral to Endocrinology    Referral Priority:   Routine    Referral Type:   Consultation    Referral Reason:   Specialty Services Required    Number of Visits Requested:   1    All questions were answered. The patient knows to call the clinic with any problems, questions or concerns. The total time spent in the appointment was 40 minutes.     Truitt Merle,  MD 11/11/2022   I, Audry Riles, am acting as scribe for Truitt Merle, MD.   I have reviewed the above documentation for accuracy and completeness, and I agree with the above.

## 2022-11-11 NOTE — Progress Notes (Signed)
Candice Ayala  Initial Assessment   Candice Ayala is a 66 y.o. year old female accompanied by sister, Candice Ayala. Clinical Social Ayala was referred by  [facility]  for assessment of psychosocial needs.   SDOH (Social Determinants of Health) assessments performed: Yes SDOH Interventions    Flowsheet Row Clinical Support from 11/11/2022 in [facility]  SDOH Interventions   Food Insecurity Interventions Intervention Not Indicated  Housing Interventions Intervention Not Indicated  Transportation Interventions Intervention Not Indicated  Financial Strain Interventions Intervention Not Indicated       SDOH Screenings   Food Insecurity: No Food Insecurity (11/11/2022)  Housing: Low Risk  (11/11/2022)  Transportation Needs: No Transportation Needs (11/11/2022)  Depression (PHQ2-9): Low Risk  (08/12/2022)  Financial Resource Strain: Low Risk  (11/11/2022)  Physical Activity: Sufficiently Active (03/11/2022)  Stress: No Stress Concern Present (03/11/2022)  Tobacco Use: Low Risk  (11/11/2022)     Distress Screen completed: Yes    11/11/2022   11:53 AM  ONCBCN DISTRESS SCREENING  Screening Type Initial Screening  Distress experienced in past week (1-10) 3  Practical problem type Ayala/school  Emotional problem type Nervousness/Anxiety;Adjusting to illness  Information Concerns Type Lack of info about diagnosis;Lack of info about treatment      Family/Social Information:  Housing Arrangement: patient lives alone Family members/support persons in your life? Family and Friends Transportation concerns: no  Employment: Working full time.  Income source: Employment Financial concerns: No Type of concern: None Food access concerns: no Religious or spiritual practice: Not known Services Currently in place:    Coping/ Adjustment to diagnosis: Patient understands treatment plan and what happens next? yes, feels better after meeting with medical  team Patient reported stressors: Anxiety/ nervousness and Adjusting to my illness Patient enjoys time with family/ friends and giving to others , and old movies Current coping skills/ strengths: Capable of independent living , Armed forces logistics/support/administrative officer , Motivation for treatment/growth , and Supportive family/friends     SUMMARY: Current SDOH Barriers:  None noted today  Clinical Social Ayala Clinical Goal(s):  No clinical social Ayala goals at this time  Interventions: Discussed common feeling and emotions when being diagnosed with cancer, and the importance of support during treatment Informed patient of the support team roles and support services at [facility] Provided [place] contact information and encouraged patient to call with any questions or concerns   Follow Up Plan: Patient will contact CSW with any support or resource needs Patient verbalizes understanding of plan: Yes    Riyanshi Wahab E Cathleen Yagi, LCSW

## 2022-11-19 ENCOUNTER — Encounter: Payer: Self-pay | Admitting: *Deleted

## 2022-11-19 ENCOUNTER — Telehealth: Payer: Self-pay | Admitting: *Deleted

## 2022-11-19 DIAGNOSIS — D0511 Intraductal carcinoma in situ of right breast: Secondary | ICD-10-CM

## 2022-11-19 NOTE — Telephone Encounter (Signed)
Spoke to pt concerning Candice Ayala from 11/11/22. Denies questions or concerns regarding dx or treatment care plan. Confirmed sx date. Informed will receive a call from Dr. Ida Rogue office with an appt to see him 4 wks after sx date. Received verbal understanding. Encourage pt to call with needs.  Verified with pt that this RN will be her nurse navigator throughout her treatment. Pt encouraged by this.

## 2022-11-27 ENCOUNTER — Other Ambulatory Visit: Payer: Self-pay | Admitting: Radiation Oncology

## 2022-11-27 ENCOUNTER — Inpatient Hospital Stay
Admission: RE | Admit: 2022-11-27 | Discharge: 2022-11-27 | Disposition: A | Discharge status: Home / Self Care | Payer: Self-pay | Source: Ambulatory Visit | Attending: Radiation Oncology | Admitting: Radiation Oncology

## 2022-11-27 DIAGNOSIS — D0511 Intraductal carcinoma in situ of right breast: Secondary | ICD-10-CM

## 2022-11-30 NOTE — Pre-Procedure Instructions (Signed)
Surgical Instructions    Your procedure is scheduled on Thursday, January 11th.  Report to [facility] Main Entrance "A" at 9:30 A.M., then check in with the Admitting office.  Call this number if you have problems the morning of surgery:  [phone]  If you have any questions prior to your surgery date call [phone]: Open Monday-Friday 8am-4pm If you experience any cold or flu symptoms such as cough, fever, chills, shortness of breath, etc. between now and your scheduled surgery, please notify us at the above number.     Remember:  Do not eat after midnight the night before your surgery  You may drink clear liquids until 8:30 a.m. the morning of your surgery.   Clear liquids allowed are: Water, Non-Citrus Juices (without pulp), Carbonated Beverages, Clear Tea, Black Coffee Only (NO MILK, CREAM OR POWDERED CREAMER of any kind), and Gatorade.  Patient Instructions   The day of surgery (if you have diabetes):  Drink ONE small 12 oz bottle of Gatorade G2 by 8:30 am the morning of surgery This bottle was given to you during your hospital  pre-op appointment visit.  Nothing else to drink after completing the  Small 12 oz bottle of Gatorade G2.         If you have questions, please contact your surgeon's office.     Take these medicines the morning of surgery with A SIP OF WATER  diphenhydrAMINE (BENADRYL)-as needed loratadine (CLARITIN)-as needed  As of today, STOP taking any Aspirin (unless otherwise instructed by your surgeon) Aleve, Naproxen, Ibuprofen, Motrin, Advil, Goody's, BC's, all herbal medications, fish oil, and all vitamins.                     Do NOT Smoke (Tobacco/Vaping) for 24 hours prior to your procedure.  If you use a CPAP at night, you may bring your mask/headgear for your overnight stay.   Contacts, glasses, piercing's, hearing aid's, dentures or partials may not be worn into surgery, please bring cases for these belongings.    For patients admitted  to the hospital, discharge time will be determined by your treatment team.   Patients discharged the day of surgery will not be allowed to drive home, and someone needs to stay with them for 24 hours.  SURGICAL WAITING ROOM VISITATION Patients having surgery or a procedure may have no more than 2 support people in the waiting area - these visitors may rotate.   Children under the age of 57 must have an adult with them who is not the patient. If the patient needs to stay at the hospital during part of their recovery, the visitor guidelines for inpatient rooms apply. Pre-op nurse will coordinate an appropriate time for 1 support person to accompany patient in pre-op.  This support person may not rotate.   Please refer to the [facility] website for the visitor guidelines for Inpatients (after your surgery is over and you are in a regular room).    Special instructions:   [place]- Preparing For Surgery  Before surgery, you can play an important role. Because skin is not sterile, your skin needs to be as free of germs as possible. You can reduce the number of germs on your skin by washing with CHG (chlorahexidine gluconate) Soap before surgery.  CHG is an antiseptic cleaner which kills germs and bonds with the skin to continue killing germs even after washing.    Oral Hygiene is also important to reduce your risk of infection.  Remember - BRUSH YOUR TEETH THE MORNING OF SURGERY WITH YOUR REGULAR TOOTHPASTE  Please do not use if you have an allergy to CHG or antibacterial soaps. If your skin becomes reddened/irritated stop using the CHG.  Do not shave (including legs and underarms) for at least 48 hours prior to first CHG shower. It is OK to shave your face.  Please follow these instructions carefully.   Shower the NIGHT BEFORE SURGERY and the MORNING OF SURGERY  If you chose to wash your hair, wash your hair first as usual with your normal shampoo.  After you shampoo, rinse your hair and  body thoroughly to remove the shampoo.  Use CHG Soap as you would any other liquid soap. You can apply CHG directly to the skin and wash gently with a scrungie or a clean washcloth.   Apply the CHG Soap to your body ONLY FROM THE NECK DOWN.  Do not use on open wounds or open sores. Avoid contact with your eyes, ears, mouth and genitals (private parts). Wash Face and genitals (private parts)  with your normal soap.   Wash thoroughly, paying special attention to the area where your surgery will be performed.  Thoroughly rinse your body with warm water from the neck down.  DO NOT shower/wash with your normal soap after using and rinsing off the CHG Soap.  Pat yourself dry with a CLEAN TOWEL.  Wear CLEAN PAJAMAS to bed the night before surgery  Place CLEAN SHEETS on your bed the night before your surgery  DO NOT SLEEP WITH PETS.   Day of Surgery: Take a shower with CHG soap. Do not wear jewelry or makeup Do not wear lotions, powders, perfumes, or deodorant. Do not shave 48 hours prior to surgery.  Do not bring valuables to the hospital.  [facility] is not responsible for any belongings or valuables. Do not wear nail polish, gel polish, artificial nails, or any other type of covering on natural nails (fingers and toes) If you have artificial nails or gel coating that need to be removed by a nail salon, please have this removed prior to surgery. Artificial nails or gel coating may interfere with anesthesia's ability to adequately monitor your vital signs. Wear Clean/Comfortable clothing the morning of surgery Remember to brush your teeth WITH YOUR REGULAR TOOTHPASTE.   Please read over the following fact sheets that you were given.    If you received a COVID test during your pre-op visit  it is requested that you wear a mask when out in public, stay away from anyone that may not be feeling well and notify your surgeon if you develop symptoms. If you have been in contact with anyone  that has tested positive in the last 10 days please notify you surgeon.

## 2022-12-01 ENCOUNTER — Encounter (HOSPITAL_COMMUNITY): Payer: Self-pay

## 2022-12-01 ENCOUNTER — Encounter (HOSPITAL_COMMUNITY)
Admission: RE | Admit: 2022-12-01 | Discharge: 2022-12-01 | Disposition: A | Discharge status: Home / Self Care | Payer: Medicare Other | Source: Ambulatory Visit | Attending: Surgery | Admitting: Surgery

## 2022-12-01 ENCOUNTER — Other Ambulatory Visit: Payer: Self-pay

## 2022-12-01 DIAGNOSIS — E669 Obesity, unspecified: Secondary | ICD-10-CM | POA: Insufficient documentation

## 2022-12-01 DIAGNOSIS — D0511 Intraductal carcinoma in situ of right breast: Secondary | ICD-10-CM | POA: Diagnosis not present

## 2022-12-01 DIAGNOSIS — I10 Essential (primary) hypertension: Secondary | ICD-10-CM | POA: Insufficient documentation

## 2022-12-01 DIAGNOSIS — Z01812 Encounter for preprocedural laboratory examination: Secondary | ICD-10-CM | POA: Diagnosis not present

## 2022-12-01 DIAGNOSIS — R7303 Prediabetes: Secondary | ICD-10-CM | POA: Insufficient documentation

## 2022-12-01 DIAGNOSIS — Z6835 Body mass index (BMI) 35.0-35.9, adult: Secondary | ICD-10-CM | POA: Insufficient documentation

## 2022-12-01 HISTORY — DX: Prediabetes: R73.03

## 2022-12-01 NOTE — Progress Notes (Signed)
Anesthesia Chart Review:  Case: [id number] Date/Time: 12/03/22 1115   Procedure: RIGHT BREAST LUMPECTOMY WITH RADIOACTIVE SEED LOCALIZATION X2 (Right: Breast)   Anesthesia type: General   Pre-op diagnosis: RIGHT BREAST DCIS   Location: [place] OR ROOM 02 / [facility]   Surgeons: Coralie Keens, MD       DISCUSSION: Patient is a 67 year old female scheduled for the above procedure.   History includes never smoker, post-operative N/V, HTN, pre-diabetes, malignant struma ovarii left ovary (s/p robotic assisted laparoscopic hysterectomy/BSO 10/01/20), right breast DCIS 10/29/22), THA (right 2004, left 02/12/22). BMI is consistent with obesity.   RSL is scheduled for 12/02/22 at 8:15 AM. Anesthesia team to evaluate on the day of surgery. She had a CMP and CBC on 11/11/22 at [facility].    VS: BP (!) 175/89   Pulse 75   Temp 36.8 C   Resp 17   Ht '5\' 5"'$  (1.651 m)   Wt 98.1 kg   SpO2 100%   BMI 35.99 kg/m    PROVIDERS: Glendale Chard, MD is PCP  Truitt Merle, MD is Baker Pierini, MD is RAD-ONC   LABS: She had labs on 11/11/22. Results included: Lab Results  Component Value Date   WBC 6.5 11/11/2022   HGB 12.6 11/11/2022   HCT 37.8 11/11/2022   PLT 363 11/11/2022   GLUCOSE 128 (H) 11/11/2022   ALT 15 11/11/2022   AST 18 11/11/2022   NA 141 11/11/2022   K 4.2 11/11/2022   CL 106 11/11/2022   CREATININE 0.78 11/11/2022   BUN 12 11/11/2022   CO2 28 11/11/2022   HGBA1C 6.1 (H) 04/22/2022    EKG: 01/05/22: NSR   CV: N/A  Past Medical History:  Diagnosis Date   Allergy    Arthritis    Breast cancer ([place])    Hypertension    Malignant struma ovarii of left ovary ([place]) 10/22/2020   PONV (postoperative nausea and vomiting)    Pre-diabetes     Past Surgical History:  Procedure Laterality Date   BREAST BIOPSY Right 11/27/2013   Procedure: RIGHT BREAST BIOPSY WITH NEEDLE LOCALIZATION;  Surgeon: Odis Hollingshead, MD;  Location: [place];  Service: General;   Laterality: Right;   COLONOSCOPY     JOINT REPLACEMENT  2004   right total hip   LYSIS OF ADHESION N/A 10/01/2020   Procedure: LYSIS OF ADHESION;  Surgeon: Everitt Amber, MD;  [facility];  Service: Gynecology;  Laterality: N/A;   OVARIAN CYST REMOVAL  1980   ROBOTIC ASSISTED TOTAL HYSTERECTOMY WITH BILATERAL SALPINGO OOPHERECTOMY N/A 10/01/2020   Procedure: XI ROBOTIC ASSISTED TOTAL HYSTERECTOMY WITH UNILATERL  SALPINGO OOPHORECTOMY;  Surgeon: Everitt Amber, MD;  [facility];  Service: Gynecology;  Laterality: N/A;   TOTAL HIP ARTHROPLASTY Right 2004   TOTAL HIP ARTHROPLASTY Left 02/12/2022    MEDICATIONS:  diphenhydrAMINE (BENADRYL) 25 MG tablet   ibuprofen (ADVIL) 200 MG tablet   loratadine (CLARITIN) 10 MG tablet   Multiple Vitamins-Minerals (MULTIVITAMIN WITH MINERALS) tablet   Turmeric 500 MG CAPS   valsartan-hydrochlorothiazide (DIOVAN-HCT) 320-25 MG tablet   No current facility-administered medications for this encounter.    Myra Gianotti, PA-C Surgical Short Stay/Anesthesiology [facility] Phone [phone] [facility] Phone [phone] 12/01/2022 1:08 PM

## 2022-12-01 NOTE — Anesthesia Preprocedure Evaluation (Signed)
Anesthesia Evaluation  Patient identified by MRN, date of birth, ID band Patient awake    Reviewed: Allergy & Precautions, NPO status , Patient's Chart, lab work & pertinent test results  History of Anesthesia Complications (+) PONV and history of anesthetic complications  Airway Mallampati: II  TM Distance: >3 FB Neck ROM: Full    Dental  (+) Dental Advisory Given, Missing   Pulmonary neg pulmonary ROS   Pulmonary exam normal breath sounds clear to auscultation       Cardiovascular hypertension, Pt. on medications Normal cardiovascular exam Rhythm:Regular Rate:Normal     Neuro/Psych negative neurological ROS  negative psych ROS   GI/Hepatic negative GI ROS, Neg liver ROS,,,  Endo/Other  negative endocrine ROS    Renal/GU negative Renal ROS     Musculoskeletal  (+) Arthritis ,    Abdominal   Peds  Hematology negative hematology ROS (+)   Anesthesia Other Findings Day of surgery medications reviewed with the patient.  Right breast cancer   Reproductive/Obstetrics Malignant struma ovarii of left ovary                             Anesthesia Physical Anesthesia Plan  ASA: 3  Anesthesia Plan: General   Post-op Pain Management: Tylenol PO (pre-op)*   Induction: Intravenous  PONV Risk Score and Plan: 4 or greater and Midazolam, Dexamethasone, Ondansetron and TIVA  Airway Management Planned: LMA  Additional Equipment:   Intra-op Plan:   Post-operative Plan: Extubation in OR  Informed Consent: I have reviewed the patients History and Physical, chart, labs and discussed the procedure including the risks, benefits and alternatives for the proposed anesthesia with the patient or authorized representative who has indicated his/her understanding and acceptance.     Dental advisory given  Plan Discussed with: CRNA  Anesthesia Plan Comments: (PAT note written 12/01/2022 by Myra Gianotti, PA-C.  )       Anesthesia Quick Evaluation

## 2022-12-01 NOTE — Progress Notes (Signed)
PCP - Dr. Glendale Chard Cardiologist - denies  PPM/ICD - n/a  Chest x-ray - n/a EKG - 01/05/22 Stress Test - denies ECHO - denies Cardiac Cath - denies  Sleep Study - denies CPAP - denies  Pt is pre-diabetic. Does not check blood sugars at home.   Last dose of GLP1 agonist- n/a GLP1 instructions: n/a  Blood Thinner Instructions: n/a Aspirin Instructions: n/a  ERAS Protcol -Clear liquids until 0830 DOS PRE-SURGERY Ensure or G2- G2 provided at PAT appt.   COVID TEST- n/a  Anesthesia review: Yes, seed placement.   Patient denies shortness of breath, fever, cough and chest pain at PAT appointment   All instructions explained to the patient, with a verbal understanding of the material. Patient agrees to go over the instructions while at home for a better understanding. Patient also instructed to self quarantine after being tested for COVID-19. The opportunity to ask questions was provided.

## 2022-12-02 NOTE — H&P (Signed)
   REFERRING PHYSICIAN: Truitt Merle, MD  PROVIDER: Beverlee Nims, MD  MRN: [id number] DOB: [birth year] DATE OF ENCOUNTER: 11/11/2022 Subjective  Chief Complaint: Breast Cancer   History of Present Illness: Candice Ayala is a 67 y.o. female who is seen today as an office consultation for evaluation of Breast Cancer .  This is a 67 year old female who was found on recent screening mammography to have 2 separate areas of calcifications in the right breast in the upper outer quadrant. 1 area measured 1.5 cm and the other measures 1.6 cm and they are 1.5 cm apart. Both were biopsied showing intermediate grade ductal carcinoma in situ. It was 95% ER and PR positive. She has had no previous problems regarding her breast. She is otherwise without complaints. She does have some nausea with general anesthesia. She had a hip replacement earlier this year. She has had a previous benign breast lumpectomy on the right breast.  Review of Systems: A complete review of systems was obtained from the patient. I have reviewed this information and discussed as appropriate with the patient. See HPI as well for other ROS.  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  Current Outpatient Medications on File Prior to Visit Medication Sig Dispense Refill valsartan-hydroCHLOROthiazide (DIOVAN-HCT) 320-25 mg tablet Take 1 tablet by mouth once daily  No current facility-administered medications on file prior to visit.  Family History Problem Relation Age of Onset Colon cancer Mother   Social  History  Tobacco Use Smoking Status Never Smokeless Tobacco Never   Social History  Socioeconomic History Marital status: Single Tobacco Use Smoking status: Never Smokeless tobacco: Never Vaping Use Vaping Use: Never used Substance and Sexual Activity Alcohol use: Never Drug use: Never Sexual activity: Defer  Objective: There were no vitals filed for this visit. There is no height or weight on file to calculate BMI.  Physical Exam  She appears well on exam  There are no palpable breast masses. She has a well-healed incision on the right breast at the 12 o'clock position. The nipple areolar complex is normal. There is no axillary adenopathy  Labs, Imaging and Diagnostic Testing: I have reviewed her mammograms, ultrasound, and pathology results  Assessment and Plan:  Diagnoses and all orders for this visit:  Neoplasm of right breast, primary tumor staging category Tis: ductal carcinoma in situ (DCIS)    I discussed the diagnosis of carcinoma in situ of the right breast. She has 2 separate areas. From a surgical standpoint we discussed breast conservation surgery versus mastectomy and the long-term results of each. I would recommend a radioactive seed guided right breast lumpectomy with 2 seeds to remove both areas. I explained the surgical procedure in detail. We discussed the risks which includes but is not limited to bleeding, infection, the need for further surgery if margins are positive, seroma formation, cardiopulmonary issues, injury to surrounding structures, etc. After discussion, she wished to proceed with breast conservation which will be scheduled.

## 2022-12-03 ENCOUNTER — Encounter (HOSPITAL_COMMUNITY): Payer: Self-pay | Admitting: Surgery

## 2022-12-03 ENCOUNTER — Ambulatory Visit (HOSPITAL_COMMUNITY): Payer: Medicare Other | Admitting: Vascular Surgery

## 2022-12-03 ENCOUNTER — Encounter (HOSPITAL_COMMUNITY)
Admission: RE | Disposition: A | Discharge status: Home / Self Care | Payer: Self-pay | Source: Ambulatory Visit | Attending: Surgery

## 2022-12-03 ENCOUNTER — Other Ambulatory Visit: Payer: Self-pay

## 2022-12-03 ENCOUNTER — Ambulatory Visit (HOSPITAL_BASED_OUTPATIENT_CLINIC_OR_DEPARTMENT_OTHER): Payer: Medicare Other | Admitting: Anesthesiology

## 2022-12-03 ENCOUNTER — Ambulatory Visit (HOSPITAL_COMMUNITY)
Admission: RE | Admit: 2022-12-03 | Discharge: 2022-12-03 | Disposition: A | Discharge status: Home / Self Care | Payer: Medicare Other | Source: Ambulatory Visit | Attending: Surgery | Admitting: Surgery

## 2022-12-03 DIAGNOSIS — Z170 Estrogen receptor positive status [ER+]: Secondary | ICD-10-CM | POA: Diagnosis not present

## 2022-12-03 DIAGNOSIS — I10 Essential (primary) hypertension: Secondary | ICD-10-CM

## 2022-12-03 DIAGNOSIS — M1990 Unspecified osteoarthritis, unspecified site: Secondary | ICD-10-CM

## 2022-12-03 DIAGNOSIS — D0511 Intraductal carcinoma in situ of right breast: Secondary | ICD-10-CM

## 2022-12-03 DIAGNOSIS — Z96649 Presence of unspecified artificial hip joint: Secondary | ICD-10-CM | POA: Insufficient documentation

## 2022-12-03 HISTORY — PX: BREAST LUMPECTOMY WITH RADIOACTIVE SEED LOCALIZATION: SHX6424

## 2022-12-03 SURGERY — BREAST LUMPECTOMY WITH RADIOACTIVE SEED LOCALIZATION
Anesthesia: General | Site: Breast | Laterality: Right

## 2022-12-03 MED ORDER — LIDOCAINE 2% (20 MG/ML) 5 ML SYRINGE
INTRAMUSCULAR | Status: DC | PRN
  Administered 2022-12-03: 80 mg via INTRAVENOUS

## 2022-12-03 MED ORDER — 0.9 % SODIUM CHLORIDE (POUR BTL) OPTIME
TOPICAL | Status: DC | PRN
  Administered 2022-12-03: 1000 mL

## 2022-12-03 MED ORDER — CHLORHEXIDINE GLUCONATE 0.12 % MT SOLN
15.0000 mL | Freq: Once | OROMUCOSAL | Status: AC
  Administered 2022-12-03: 15 mL via OROMUCOSAL

## 2022-12-03 MED ORDER — CHLORHEXIDINE GLUCONATE 0.12 % MT SOLN
OROMUCOSAL | Status: AC
  Filled 2022-12-03: qty 15

## 2022-12-03 MED ORDER — CHLORHEXIDINE GLUCONATE CLOTH 2 % EX PADS: 6.0000 | MEDICATED_PAD | Freq: Once | CUTANEOUS | Status: DC

## 2022-12-03 MED ORDER — PROPOFOL 10 MG/ML IV BOLUS
INTRAVENOUS | Status: DC | PRN
  Administered 2022-12-03: 140 mg via INTRAVENOUS
  Administered 2022-12-03: 60 mg via INTRAVENOUS

## 2022-12-03 MED ORDER — FENTANYL CITRATE (PF) 250 MCG/5ML IJ SOLN
INTRAMUSCULAR | Status: DC | PRN
  Administered 2022-12-03 (×2): 50 ug via INTRAVENOUS
  Administered 2022-12-03: 100 ug via INTRAVENOUS

## 2022-12-03 MED ORDER — ONDANSETRON HCL 4 MG/2ML IJ SOLN: 4.0000 mg | Freq: Once | INTRAMUSCULAR | Status: DC | PRN

## 2022-12-03 MED ORDER — TRAMADOL HCL 50 MG PO TABS: 50.0000 mg | ORAL_TABLET | Freq: Four times a day (QID) | ORAL | 0 refills | Status: DC | PRN

## 2022-12-03 MED ORDER — FENTANYL CITRATE (PF) 250 MCG/5ML IJ SOLN
INTRAMUSCULAR | Status: AC
  Filled 2022-12-03: qty 5

## 2022-12-03 MED ORDER — LACTATED RINGERS IV SOLN: INTRAVENOUS | Status: DC

## 2022-12-03 MED ORDER — PHENYLEPHRINE 80 MCG/ML (10ML) SYRINGE FOR IV PUSH (FOR BLOOD PRESSURE SUPPORT)
PREFILLED_SYRINGE | INTRAVENOUS | Status: DC | PRN
  Administered 2022-12-03 (×4): 160 ug via INTRAVENOUS

## 2022-12-03 MED ORDER — PROPOFOL 500 MG/50ML IV EMUL
INTRAVENOUS | Status: DC | PRN
  Administered 2022-12-03: 125 ug/kg/min via INTRAVENOUS

## 2022-12-03 MED ORDER — BUPIVACAINE-EPINEPHRINE 0.25% -1:200000 IJ SOLN
INTRAMUSCULAR | Status: DC | PRN
  Administered 2022-12-03: 20 mL

## 2022-12-03 MED ORDER — ACETAMINOPHEN 500 MG PO TABS
1000.0000 mg | ORAL_TABLET | ORAL | Status: AC
  Administered 2022-12-03: 1000 mg via ORAL
  Filled 2022-12-03: qty 2

## 2022-12-03 MED ORDER — ENSURE PRE-SURGERY PO LIQD: 296.0000 mL | Freq: Once | ORAL | Status: DC

## 2022-12-03 MED ORDER — ORAL CARE MOUTH RINSE: 15.0000 mL | Freq: Once | OROMUCOSAL | Status: AC

## 2022-12-03 MED ORDER — DEXAMETHASONE SODIUM PHOSPHATE 10 MG/ML IJ SOLN
INTRAMUSCULAR | Status: DC | PRN
  Administered 2022-12-03: 10 mg via INTRAVENOUS

## 2022-12-03 MED ORDER — KETOROLAC TROMETHAMINE 15 MG/ML IJ SOLN
INTRAMUSCULAR | Status: DC | PRN
  Administered 2022-12-03: 15 mg via INTRAVENOUS

## 2022-12-03 MED ORDER — PROPOFOL 10 MG/ML IV BOLUS
INTRAVENOUS | Status: AC
  Filled 2022-12-03: qty 20

## 2022-12-03 MED ORDER — CEFAZOLIN SODIUM-DEXTROSE 2-4 GM/100ML-% IV SOLN
2.0000 g | INTRAVENOUS | Status: AC
  Administered 2022-12-03: 2 g via INTRAVENOUS
  Filled 2022-12-03: qty 100

## 2022-12-03 MED ORDER — SUCCINYLCHOLINE CHLORIDE 200 MG/10ML IV SOSY
PREFILLED_SYRINGE | INTRAVENOUS | Status: DC | PRN
  Administered 2022-12-03: 100 mg via INTRAVENOUS

## 2022-12-03 MED ORDER — FENTANYL CITRATE (PF) 100 MCG/2ML IJ SOLN: 25.0000 ug | INTRAMUSCULAR | Status: DC | PRN

## 2022-12-03 MED ORDER — GLYCOPYRROLATE 0.2 MG/ML IJ SOLN
INTRAMUSCULAR | Status: DC | PRN
  Administered 2022-12-03: .2 mg via INTRAVENOUS

## 2022-12-03 MED ORDER — ONDANSETRON HCL 4 MG/2ML IJ SOLN
INTRAMUSCULAR | Status: DC | PRN
  Administered 2022-12-03: 4 mg via INTRAVENOUS

## 2022-12-03 MED ORDER — BUPIVACAINE-EPINEPHRINE (PF) 0.5% -1:200000 IJ SOLN
INTRAMUSCULAR | Status: AC
  Filled 2022-12-03: qty 30

## 2022-12-03 SURGICAL SUPPLY — 35 items
ADH SKN CLS APL DERMABOND .7 (GAUZE/BANDAGES/DRESSINGS) ×1
APL PRP STRL LF DISP 70% ISPRP (MISCELLANEOUS) ×1
APPLIER CLIP 9.375 MED OPEN (MISCELLANEOUS) ×1
APR CLP MED 9.3 20 MLT OPN (MISCELLANEOUS) ×1
BAG COUNTER SPONGE SURGICOUNT (BAG) ×2 IMPLANT
BAG SPNG CNTER NS LX DISP (BAG) ×1
BINDER BREAST LRG (GAUZE/BANDAGES/DRESSINGS) IMPLANT
BINDER BREAST XLRG (GAUZE/BANDAGES/DRESSINGS) IMPLANT
CANISTER SUCT 3000ML PPV (MISCELLANEOUS) ×2 IMPLANT
CHLORAPREP W/TINT 26 (MISCELLANEOUS) ×2 IMPLANT
CLIP APPLIE 9.375 MED OPEN (MISCELLANEOUS) ×2 IMPLANT
COVER PROBE W GEL 5X96 (DRAPES) ×2 IMPLANT
COVER SURGICAL LIGHT HANDLE (MISCELLANEOUS) ×2 IMPLANT
DERMABOND ADVANCED .7 DNX12 (GAUZE/BANDAGES/DRESSINGS) ×2 IMPLANT
DEVICE DUBIN SPECIMEN MAMMOGRA (MISCELLANEOUS) ×2 IMPLANT
DRAPE CHEST BREAST 15X10 FENES (DRAPES) ×2 IMPLANT
ELECT CAUTERY BLADE 6.4 (BLADE) ×2 IMPLANT
ELECT REM PT RETURN 9FT ADLT (ELECTROSURGICAL) ×1
ELECTRODE REM PT RTRN 9FT ADLT (ELECTROSURGICAL) ×2 IMPLANT
GLOVE SURG SIGNA 7.5 PF LTX (GLOVE) ×2 IMPLANT
GOWN STRL REUS W/ TWL LRG LVL3 (GOWN DISPOSABLE) ×2 IMPLANT
GOWN STRL REUS W/ TWL XL LVL3 (GOWN DISPOSABLE) ×2 IMPLANT
GOWN STRL REUS W/TWL LRG LVL3 (GOWN DISPOSABLE) ×1
GOWN STRL REUS W/TWL XL LVL3 (GOWN DISPOSABLE) ×1
KIT BASIN OR (CUSTOM PROCEDURE TRAY) ×2 IMPLANT
KIT MARKER MARGIN INK (KITS) ×2 IMPLANT
NDL HYPO 25GX1X1/2 BEV (NEEDLE) ×2 IMPLANT
NEEDLE HYPO 25GX1X1/2 BEV (NEEDLE) ×1 IMPLANT
NS IRRIG 1000ML POUR BTL (IV SOLUTION) IMPLANT
PACK GENERAL/GYN (CUSTOM PROCEDURE TRAY) ×2 IMPLANT
SUT MNCRL AB 4-0 PS2 18 (SUTURE) ×2 IMPLANT
SUT VIC AB 3-0 SH 18 (SUTURE) ×2 IMPLANT
SYR CONTROL 10ML LL (SYRINGE) ×2 IMPLANT
TOWEL GREEN STERILE (TOWEL DISPOSABLE) ×2 IMPLANT
TOWEL GREEN STERILE FF (TOWEL DISPOSABLE) ×2 IMPLANT

## 2022-12-03 NOTE — Transfer of Care (Signed)
Immediate Anesthesia Transfer of Care Note  Patient: Candice Ayala  Procedure(s) Performed: RIGHT BREAST LUMPECTOMY WITH RADIOACTIVE SEED LOCALIZATION X2 (Right: Breast)  Patient Location: PACU  Anesthesia Type:General  Level of Consciousness: awake, alert , and oriented  Airway & Oxygen Therapy: Patient Spontanous Breathing  Post-op Assessment: Report given to RN  Post vital signs: Reviewed and stable  Last Vitals:  Vitals Value Taken Time  BP 148/108 12/03/22 1319  Temp    Pulse 84 12/03/22 1323  Resp 16 12/03/22 1323  SpO2 95 % 12/03/22 1323  Vitals shown include unvalidated device data.  Last Pain:  Vitals:   12/03/22 0947  PainSc: 0-No pain      Patients Stated Pain Goal: 0 (13/14/38 8875)  Complications: No notable events documented.

## 2022-12-03 NOTE — Interval H&P Note (Signed)
History and Physical Interval Note:no change in H and P  12/03/2022 11:16 AM  [place]  has presented today for surgery, with the diagnosis of RIGHT BREAST DCIS.  The various methods of treatment have been discussed with the patient and family. After consideration of risks, benefits and other options for treatment, the patient has consented to  Procedure(s): RIGHT BREAST LUMPECTOMY WITH RADIOACTIVE SEED LOCALIZATION X2 (Right) as a surgical intervention.  The patient's history has been reviewed, patient examined, no change in status, stable for surgery.  I have reviewed the patient's chart and labs.  Questions were answered to the patient's satisfaction.     Candice Ayala

## 2022-12-03 NOTE — Op Note (Signed)
RIGHT BREAST LUMPECTOMY WITH RADIOACTIVE SEED LOCALIZATION X2  Procedure Note  RHEGAN TRUNNELL 12/03/2022   Pre-op Diagnosis: RIGHT BREAST DCIS     Post-op Diagnosis: same  Procedure(s): RIGHT BREAST LUMPECTOMY WITH RADIOACTIVE SEED LOCALIZATION X2  Surgeon(s): Coralie Keens, MD  Anesthesia: General  Staff:  Circulator: Rometta Emery, RN; Jackelyn Knife, RN Scrub Person: Lovett Sox, CST; Celene Squibb, RN  Estimated Blood Loss: Minimal               Specimens: sent to path  Indications: This is a 67 year old female was found to have 2 separate areas of calcifications in the upper outer quadrant of the right breast.  Both areas were biopsied showing ductal carcinoma in situ.  The decision was made to proceed with a radioactive seed guided right breast lumpectomy x 2.  Procedure: Patient was brought to the operating identified the correct patient.  She was placed upon the operating table and general anesthesia was induced.  Her right breast was prepped and draped in the usual sterile fashion.  Using the neoprobe I located both radioactive seeds at approximately the 9 o'clock position of the right breast laterally.  The 2 seeds were approximately 2 cm apart.  I anesthetized the skin at that area with Marcaine and then made an elliptical incision with scalpel.  I then dissected down circumferentially to the breast tissue with electrocautery.  With the aid of neoprobe I then dissected around both seeds.  I dissected medial and then superior and inferior until I Told the more lateral seed in the complete dissection around to just above the chest wall.  I then completed lumpectomy with the cautery.  Once the specimen was removed I marked all margins with paint.  An x-ray was performed the specimen confirming that both radioactive seeds in both original biopsy) specimen.  I then sent this to pathology for further evaluation.  Hemostasis was achieved with the cautery.  I injected  the lumpectomy cavity further with Marcaine.  I placed surgical clips around the periphery of the cavity for marking purposes.  I then closed the subcutaneous tissue with interrupted 3-0 Vicryl sutures and closed the skin with a running 4-0 Monocryl.  Dermabond was then applied.  The patient was placed in a breast binder.  All counts were correct at the end of the procedure.  She was then extubated in the operating room and taken in a stable condition to the recovery room.          Coralie Keens   Date: 12/03/2022  Time: 1:06 PM

## 2022-12-03 NOTE — Anesthesia Postprocedure Evaluation (Signed)
Anesthesia Post Note  Patient: Candice Ayala  Procedure(s) Performed: RIGHT BREAST LUMPECTOMY WITH RADIOACTIVE SEED LOCALIZATION X2 (Right: Breast)     Patient location during evaluation: PACU Anesthesia Type: General Level of consciousness: awake and alert Pain management: pain level controlled Vital Signs Assessment: post-procedure vital signs reviewed and stable Respiratory status: spontaneous breathing, nonlabored ventilation, respiratory function stable and patient connected to nasal cannula oxygen Cardiovascular status: blood pressure returned to baseline and stable Postop Assessment: no apparent nausea or vomiting Anesthetic complications: no  No notable events documented.  Last Vitals:  Vitals:   12/03/22 1334 12/03/22 1349  BP: 112/62 (!) 149/79  Pulse: 75 63  Resp: 20 13  Temp:  36.7 C  SpO2: 95% 94%    Last Pain:  Vitals:   12/03/22 1349  PainSc: 0-No pain                 Santa Lighter

## 2022-12-03 NOTE — Discharge Instructions (Signed)
[place] Office Phone Number [phone]  BREAST BIOPSY/ PARTIAL MASTECTOMY: POST OP INSTRUCTIONS  Always review your discharge instruction sheet given to you by the facility where your surgery was performed.  IF YOU HAVE DISABILITY OR FAMILY LEAVE FORMS, YOU MUST BRING THEM TO THE OFFICE FOR PROCESSING.  DO NOT GIVE THEM TO YOUR DOCTOR.  A prescription for pain medication may be given to you upon discharge.  Take your pain medication as prescribed, if needed.  If narcotic pain medicine is not needed, then you may take acetaminophen (Tylenol) or ibuprofen (Advil) as needed. Take your usually prescribed medications unless otherwise directed If you need a refill on your pain medication, please contact your pharmacy.  They will contact our office to request authorization.  Prescriptions will not be filled after 5pm or on week-ends. You should eat very light the first 24 hours after surgery, such as soup, crackers, pudding, etc.  Resume your normal diet the day after surgery. Most patients will experience some swelling and bruising in the breast.  Ice packs and a good support bra will help.  Swelling and bruising can take several days to resolve.  It is common to experience some constipation if taking pain medication after surgery.  Increasing fluid intake and taking a stool softener will usually help or prevent this problem from occurring.  A mild laxative (Milk of Magnesia or Miralax) should be taken according to package directions if there are no bowel movements after 48 hours. Unless discharge instructions indicate otherwise, you may remove your bandages 24-48 hours after surgery, and you may shower at that time.  You may have steri-strips (small skin tapes) in place directly over the incision.  These strips should be left on the skin for 7-10 days.  If your surgeon used skin glue on the incision, you may shower in 24 hours.  The glue will flake off over the next 2-3 weeks.  Any  sutures or staples will be removed at the office during your follow-up visit. ACTIVITIES:  You may resume regular daily activities (gradually increasing) beginning the next day.  Wearing a good support bra or sports bra minimizes pain and swelling.  You may have sexual intercourse when it is comfortable. You may drive when you no longer are taking prescription pain medication, you can comfortably wear a seatbelt, and you can safely maneuver your car and apply brakes. RETURN TO WORK:  ______________________________________________________________________________________ Dennis Bast should see your doctor in the office for a follow-up appointment approximately two weeks after your surgery.  Your doctor's nurse will typically make your follow-up appointment when she calls you with your pathology report.  Expect your pathology report 2-3 business days after your surgery.  You may call to check if you do not hear from [place] after three days. OTHER INSTRUCTIONS: YOU MAY REMOVE THE BINDER AND SHOWER STARTING TOMORROW THEN PUT THE BINDER BACK ON ICE PACK, TYLENOL, AND IBUPROFEN ALSO FOR PAIN NO VIGOROUS ACTIVITY FOR 1 WEEK _______________________________________________________________________________________________ _____________________________________________________________________________________________________________________________________ _____________________________________________________________________________________________________________________________________ _____________________________________________________________________________________________________________________________________  WHEN TO CALL YOUR DOCTOR: Fever over 101.0 Nausea and/or vomiting. Extreme swelling or bruising. Continued bleeding from incision. Increased pain, redness, or drainage from the incision.  The clinic staff is available to answer your questions during regular business hours.  Please don't hesitate to call and  ask to speak to one of the nurses for clinical concerns.  If you have a medical emergency, go to the nearest emergency room or call 911.  A surgeon from [facility] is always on  call at the hospital.  For further questions, please visit [email or web address]

## 2022-12-03 NOTE — Anesthesia Procedure Notes (Signed)
Procedure Name: Intubation Date/Time: 12/03/2022 12:30 PM  Performed by: Reeves Dam, CRNAPre-anesthesia Checklist: Patient identified, Emergency Drugs available, Suction available and Patient being monitored Patient Re-evaluated:Patient Re-evaluated prior to induction Oxygen Delivery Method: Circle system utilized Preoxygenation: Pre-oxygenation with 100% oxygen Induction Type: IV induction Ventilation: Mask ventilation without difficulty Laryngoscope Size: Mac and 4 Grade View: Grade II Tube type: Oral Tube size: 7.0 mm Number of attempts: 1 Airway Equipment and Method: Stylet Placement Confirmation: ETT inserted through vocal cords under direct vision, positive ETCO2 and breath sounds checked- equal and bilateral Secured at: 21 cm Tube secured with: Tape Dental Injury: Teeth and Oropharynx as per pre-operative assessment

## 2022-12-04 ENCOUNTER — Encounter (HOSPITAL_COMMUNITY): Payer: Self-pay | Admitting: Surgery

## 2022-12-07 LAB — SURGICAL PATHOLOGY

## 2022-12-10 ENCOUNTER — Encounter (HOSPITAL_COMMUNITY): Payer: Self-pay

## 2022-12-28 NOTE — Progress Notes (Signed)
Radiation Oncology         [phone] ________________________________  Name: Candice Ayala        MRN: [id number]  Date of Service: 12/31/2022 DOB: February 26, 1956  UK:GURKYHC, Bailey Mech, MD  Truitt Merle, MD     REFERRING PHYSICIAN: Truitt Merle, MD   DIAGNOSIS: The encounter diagnosis was Ductal carcinoma in situ (DCIS) of right breast.   HISTORY OF PRESENT ILLNESS: Candice Ayala is a 67 y.o. female originally seen in the multidisciplinary breast clinic for a new diagnosis of right breast cancer. The patient was noted to have' \\screening'$  detected calcifications in the right breast earlier this year which were seen in the, lateral upper quadrant and an 8 mm mass in the left breast, upper quadrant.  She underwent further diagnostic imaging, and the findings in the left breast were consistent with a skin lesion and were felt to be benign.  She did undergo further diagnostic imaging on 10/20/2022 which showed 2 separate groups of pleomorphic calcifications in the right breast in the upper outer quadrant measuring 1.6 cm and 1.5 cm.  These sites were 1.4 cm apart.  The more anterior group had increased in the interval.  She underwent stereotactic biopsies of the right breast on 10/29/2022 which showed intermediate grade DCIS in both of the specimens.  Her cancer was ER/PR positive.    Since her last visit the patient has undergone a right lumpectomy on 12/03/2022 with Dr. Ninfa Linden.  Final pathology showed intermediate grade DCIS measuring 4 cm with associated calcifications.  DCIS was less than .1 mm from the posterior margin.  No invasive carcinoma was appreciated.  She saw Dr. Ninfa Linden on Tuesday of this week.  He***  PREVIOUS RADIATION THERAPY: No   PAST MEDICAL HISTORY:  Past Medical History:  Diagnosis Date   Allergy    Arthritis    Breast cancer ([place])    Hypertension    Malignant struma ovarii of left ovary ([place]) 10/22/2020   PONV (postoperative nausea and vomiting)    Pre-diabetes         PAST SURGICAL HISTORY: Past Surgical History:  Procedure Laterality Date   BREAST BIOPSY Right 11/27/2013   Procedure: RIGHT BREAST BIOPSY WITH NEEDLE LOCALIZATION;  Surgeon: Odis Hollingshead, MD;  Location: [place];  Service: General;  Laterality: Right;   BREAST LUMPECTOMY WITH RADIOACTIVE SEED LOCALIZATION Right 12/03/2022   Procedure: RIGHT BREAST LUMPECTOMY WITH RADIOACTIVE SEED LOCALIZATION X2;  Surgeon: Coralie Keens, MD;  Location: [place];  Service: General;  Laterality: Right;   COLONOSCOPY     JOINT REPLACEMENT  2004   right total hip   LYSIS OF ADHESION N/A 10/01/2020   Procedure: LYSIS OF ADHESION;  Surgeon: Everitt Amber, MD;  [facility];  Service: Gynecology;  Laterality: N/A;   OVARIAN CYST REMOVAL  1980   ROBOTIC ASSISTED TOTAL HYSTERECTOMY WITH BILATERAL SALPINGO OOPHERECTOMY N/A 10/01/2020   Procedure: XI ROBOTIC ASSISTED TOTAL HYSTERECTOMY WITH UNILATERL  SALPINGO OOPHORECTOMY;  Surgeon: Everitt Amber, MD;  [facility];  Service: Gynecology;  Laterality: N/A;   TOTAL HIP ARTHROPLASTY Right 2004   TOTAL HIP ARTHROPLASTY Left 02/12/2022     FAMILY HISTORY:  Family History  Problem Relation Age of Onset   Colon cancer Mother        dx. >86   Early death Father    Hypertension Brother    Congestive Heart Failure Brother    Breast cancer Maternal Grandmother  dx. >50     SOCIAL HISTORY:  reports that she has never smoked. She has never used smokeless tobacco. She reports that she does not drink alcohol and does not use drugs. The patient is divorced and lives in [place]. 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 She's accompanied by her sister. ***   ALLERGIES: Contrast media [iodinated contrast media]   MEDICATIONS:  Current Outpatient Medications  Medication Sig Dispense Refill   diphenhydrAMINE (BENADRYL) 25 MG tablet Take 25 mg by mouth every 8 (eight) hours as needed for  allergies.     ibuprofen (ADVIL) 200 MG tablet Take 200 mg by mouth every 6 (six) hours as needed for headache or moderate pain.     loratadine (CLARITIN) 10 MG tablet Take 10 mg by mouth daily as needed for allergies.     Multiple Vitamins-Minerals (MULTIVITAMIN WITH MINERALS) tablet Take 1 tablet by mouth daily.     traMADol (ULTRAM) 50 MG tablet Take 1 tablet (50 mg total) by mouth every 6 (six) hours as needed for moderate pain or severe pain. 20 tablet 0   Turmeric 500 MG CAPS Take 500 mg by mouth 3 (three) times a week.     valsartan-hydrochlorothiazide (DIOVAN-HCT) 320-25 MG tablet Take 1 tablet by mouth daily. 90 tablet 1   No current facility-administered medications for this visit.     REVIEW OF SYSTEMS: On review of systems, the patient reports that she is doing well overall. No breast specific complaints are noted.      PHYSICAL EXAM:  Wt Readings from Last 3 Encounters:  12/03/22 216 lb (98 kg)  12/01/22 216 lb 4.8 oz (98.1 kg)  11/11/22 216 lb 9.6 oz (98.2 kg)   Temp Readings from Last 3 Encounters:  12/03/22 98 F (36.7 C)  12/01/22 98.3 F (36.8 C)  11/11/22 98.5 F (36.9 C) (Oral)   BP Readings from Last 3 Encounters:  12/03/22 (!) 149/79  12/01/22 (!) 175/89  11/11/22 (!) 167/91   Pulse Readings from Last 3 Encounters:  12/03/22 63  12/01/22 75  11/11/22 77    In general this is a well appearing African American female in no acute distress. She's alert and oriented x4 and appropriate throughout the examination. Cardiopulmonary assessment is negative for acute distress and she exhibits normal effort. Bilateral breast exam is deferred.    ECOG = 1  0 - Asymptomatic (Fully active, able to carry on all predisease activities without restriction)  1 - Symptomatic but completely ambulatory (Restricted in physically strenuous activity but ambulatory and able to carry out work of a light or sedentary nature. For example, light housework, office work)  2 -  Symptomatic, <50% in bed during the day (Ambulatory and capable of all self care but unable to carry out any work activities. Up and about more than 50% of waking hours)  3 - Symptomatic, >50% in bed, but not bedbound (Capable of only limited self-care, confined to bed or chair 50% or more of waking hours)  4 - Bedbound (Completely disabled. Cannot carry on any self-care. Totally confined to bed or chair)  5 - Death   Eustace Pen MM, Creech RH, Tormey DC, et al. [phone]). "Toxicity and response criteria of the [facility]". [place] Oncol. 5 (6): 649-55    LABORATORY DATA:  Lab Results  Component Value Date   WBC 6.5 11/11/2022   HGB 12.6 11/11/2022   HCT 37.8 11/11/2022   MCV 86.3  11/11/2022   PLT 363 11/11/2022   Lab Results  Component Value Date   NA 141 11/11/2022   K 4.2 11/11/2022   CL 106 11/11/2022   CO2 28 11/11/2022   Lab Results  Component Value Date   ALT 15 11/11/2022   AST 18 11/11/2022   ALKPHOS 81 11/11/2022   BILITOT 0.7 11/11/2022      RADIOGRAPHY: No results found.     IMPRESSION/PLAN: 1. Intermediate Grade, ER/PR positive DCIS of the right breast. Dr. Lisbeth Renshaw has reviewed her final pathology findings and today we discussed the nature of early stage right breast disease. She has done well since surgery, and Dr. Ninfa Linden *** Dr. Lisbeth Renshaw recommends external radiotherapy to the breast  to reduce risks of local recurrence followed by antiestrogen therapy. We discussed the risks, benefits, short, and long term effects of radiotherapy, as well as the curative intent, and the patient is interested in proceeding.I reviewed the delivery and logistics of radiotherapy and Dr. Lisbeth Renshaw recommends 4 weeks of radiotherapy to the right breast. Written consent is obtained and placed in the chart, a copy was provided to the patient. She will simulate today.    In a visit lasting ***minutes, greater than 50% of the time was spent face to face reviewing her  case, as well as in preparation of, discussing, and coordinating the patient's care.     Carola Rhine, [facility]    **Disclaimer: This note was dictated with voice recognition software. Similar sounding words can inadvertently be transcribed and this note may contain transcription errors which may not have been corrected upon publication of note.**

## 2022-12-31 ENCOUNTER — Ambulatory Visit
Admission: RE | Admit: 2022-12-31 | Discharge: 2022-12-31 | Disposition: A | Discharge status: Home / Self Care | Payer: Medicare Other | Source: Ambulatory Visit | Attending: Radiation Oncology | Admitting: Radiation Oncology

## 2022-12-31 ENCOUNTER — Encounter: Payer: Self-pay | Admitting: Radiation Oncology

## 2022-12-31 ENCOUNTER — Other Ambulatory Visit: Payer: Self-pay

## 2022-12-31 VITALS — BP 169/81 | HR 72 | Temp 98.0°F | Resp 19 | Ht 65.0 in | Wt 212.0 lb

## 2022-12-31 DIAGNOSIS — Z510 Encounter for antineoplastic radiation therapy: Secondary | ICD-10-CM | POA: Diagnosis present

## 2022-12-31 DIAGNOSIS — Z79899 Other long term (current) drug therapy: Secondary | ICD-10-CM | POA: Diagnosis not present

## 2022-12-31 DIAGNOSIS — Z800 Family history of malignant neoplasm of digestive organs: Secondary | ICD-10-CM | POA: Insufficient documentation

## 2022-12-31 DIAGNOSIS — Z803 Family history of malignant neoplasm of breast: Secondary | ICD-10-CM | POA: Diagnosis not present

## 2022-12-31 DIAGNOSIS — D0511 Intraductal carcinoma in situ of right breast: Secondary | ICD-10-CM | POA: Insufficient documentation

## 2022-12-31 DIAGNOSIS — Z170 Estrogen receptor positive status [ER+]: Secondary | ICD-10-CM | POA: Insufficient documentation

## 2022-12-31 NOTE — Progress Notes (Addendum)
In-person nursing interview for 67 y.o female w/ Ductal carcinoma in situ (DCIS) of right breast.  I verified patient's identity and began nursing interview. Patient reports incision line healing well and no discomfort at this time.  Meaningful use complete. Postmenopausal.  BP (!) 169/81 (BP Location: Left Arm, Patient Position: Sitting, Cuff Size: Large)   Pulse 72   Temp 98 F (36.7 C) (Oral)   Resp 19   Ht '5\' 5"'$  (1.651 m)   Wt 212 lb (96.2 kg)   SpO2 100%   BMI 35.28 kg/m   This concludes the interview.   Leandra Kern, LPN

## 2023-01-08 ENCOUNTER — Telehealth: Payer: Self-pay | Admitting: Hematology

## 2023-01-08 ENCOUNTER — Encounter: Payer: Self-pay | Admitting: *Deleted

## 2023-01-08 DIAGNOSIS — Z510 Encounter for antineoplastic radiation therapy: Secondary | ICD-10-CM | POA: Diagnosis not present

## 2023-01-08 NOTE — Telephone Encounter (Signed)
Per 2/16 IPS rescheduled patient, Patient aware and confirmed time and date of appointment.

## 2023-01-11 ENCOUNTER — Other Ambulatory Visit: Payer: Self-pay

## 2023-01-11 ENCOUNTER — Ambulatory Visit
Admission: RE | Admit: 2023-01-11 | Discharge: 2023-01-11 | Disposition: A | Discharge status: Home / Self Care | Payer: Medicare Other | Source: Ambulatory Visit | Attending: Radiation Oncology | Admitting: Radiation Oncology

## 2023-01-11 DIAGNOSIS — Z510 Encounter for antineoplastic radiation therapy: Secondary | ICD-10-CM | POA: Diagnosis not present

## 2023-01-11 LAB — RAD ONC ARIA SESSION SUMMARY
Course Elapsed Days: 0
Plan Fractions Treated to Date: 1
Plan Prescribed Dose Per Fraction: 2.66 Gy
Plan Total Fractions Prescribed: 16
Plan Total Prescribed Dose: 42.56 Gy
Reference Point Dosage Given to Date: 2.66 Gy
Reference Point Session Dosage Given: 2.66 Gy
Session Number: 1

## 2023-01-12 ENCOUNTER — Ambulatory Visit
Admission: RE | Admit: 2023-01-12 | Discharge: 2023-01-12 | Disposition: A | Discharge status: Home / Self Care | Payer: Medicare Other | Source: Ambulatory Visit | Attending: Radiation Oncology | Admitting: Radiation Oncology

## 2023-01-12 ENCOUNTER — Other Ambulatory Visit: Payer: Self-pay

## 2023-01-12 DIAGNOSIS — Z510 Encounter for antineoplastic radiation therapy: Secondary | ICD-10-CM | POA: Diagnosis not present

## 2023-01-12 LAB — RAD ONC ARIA SESSION SUMMARY
Course Elapsed Days: 1
Plan Fractions Treated to Date: 2
Plan Prescribed Dose Per Fraction: 2.66 Gy
Plan Total Fractions Prescribed: 16
Plan Total Prescribed Dose: 42.56 Gy
Reference Point Dosage Given to Date: 5.32 Gy
Reference Point Session Dosage Given: 2.66 Gy
Session Number: 2

## 2023-01-13 ENCOUNTER — Other Ambulatory Visit: Payer: Self-pay

## 2023-01-13 ENCOUNTER — Ambulatory Visit
Admission: RE | Admit: 2023-01-13 | Discharge: 2023-01-13 | Disposition: A | Discharge status: Home / Self Care | Payer: Medicare Other | Source: Ambulatory Visit | Attending: Radiation Oncology | Admitting: Radiation Oncology

## 2023-01-13 DIAGNOSIS — Z510 Encounter for antineoplastic radiation therapy: Secondary | ICD-10-CM | POA: Diagnosis not present

## 2023-01-13 LAB — RAD ONC ARIA SESSION SUMMARY
Course Elapsed Days: 2
Plan Fractions Treated to Date: 3
Plan Prescribed Dose Per Fraction: 2.66 Gy
Plan Total Fractions Prescribed: 16
Plan Total Prescribed Dose: 42.56 Gy
Reference Point Dosage Given to Date: 7.98 Gy
Reference Point Session Dosage Given: 2.66 Gy
Session Number: 3

## 2023-01-14 ENCOUNTER — Other Ambulatory Visit: Payer: Self-pay

## 2023-01-14 ENCOUNTER — Ambulatory Visit
Admission: RE | Admit: 2023-01-14 | Discharge: 2023-01-14 | Disposition: A | Discharge status: Home / Self Care | Payer: Medicare Other | Source: Ambulatory Visit | Attending: Radiation Oncology | Admitting: Radiation Oncology

## 2023-01-14 DIAGNOSIS — Z510 Encounter for antineoplastic radiation therapy: Secondary | ICD-10-CM | POA: Diagnosis not present

## 2023-01-14 LAB — RAD ONC ARIA SESSION SUMMARY
Course Elapsed Days: 3
Plan Fractions Treated to Date: 4
Plan Prescribed Dose Per Fraction: 2.66 Gy
Plan Total Fractions Prescribed: 16
Plan Total Prescribed Dose: 42.56 Gy
Reference Point Dosage Given to Date: 10.64 Gy
Reference Point Session Dosage Given: 2.66 Gy
Session Number: 4

## 2023-01-15 ENCOUNTER — Ambulatory Visit
Admission: RE | Admit: 2023-01-15 | Discharge: 2023-01-15 | Disposition: A | Discharge status: Home / Self Care | Payer: Medicare Other | Source: Ambulatory Visit | Attending: Radiation Oncology | Admitting: Radiation Oncology

## 2023-01-15 ENCOUNTER — Other Ambulatory Visit: Payer: Self-pay

## 2023-01-15 DIAGNOSIS — D0511 Intraductal carcinoma in situ of right breast: Secondary | ICD-10-CM

## 2023-01-15 DIAGNOSIS — Z510 Encounter for antineoplastic radiation therapy: Secondary | ICD-10-CM | POA: Diagnosis not present

## 2023-01-15 LAB — RAD ONC ARIA SESSION SUMMARY
Course Elapsed Days: 4
Plan Fractions Treated to Date: 5
Plan Prescribed Dose Per Fraction: 2.66 Gy
Plan Total Fractions Prescribed: 16
Plan Total Prescribed Dose: 42.56 Gy
Reference Point Dosage Given to Date: 13.3 Gy
Reference Point Session Dosage Given: 2.66 Gy
Session Number: 5

## 2023-01-15 MED ORDER — RADIAPLEXRX EX GEL: Freq: Once | CUTANEOUS | Status: AC

## 2023-01-15 MED ORDER — ALRA NON-METALLIC DEODORANT (RAD-ONC)
1.0000 | Freq: Once | TOPICAL | Status: AC
  Administered 2023-01-15: 1 via TOPICAL

## 2023-01-18 ENCOUNTER — Other Ambulatory Visit: Payer: Self-pay

## 2023-01-18 ENCOUNTER — Ambulatory Visit
Admission: RE | Admit: 2023-01-18 | Discharge: 2023-01-18 | Disposition: A | Discharge status: Home / Self Care | Payer: Medicare Other | Source: Ambulatory Visit | Attending: Radiation Oncology | Admitting: Radiation Oncology

## 2023-01-18 DIAGNOSIS — Z510 Encounter for antineoplastic radiation therapy: Secondary | ICD-10-CM | POA: Diagnosis not present

## 2023-01-18 LAB — RAD ONC ARIA SESSION SUMMARY
Course Elapsed Days: 7
Plan Fractions Treated to Date: 6
Plan Prescribed Dose Per Fraction: 2.66 Gy
Plan Total Fractions Prescribed: 16
Plan Total Prescribed Dose: 42.56 Gy
Reference Point Dosage Given to Date: 15.96 Gy
Reference Point Session Dosage Given: 2.66 Gy
Session Number: 6

## 2023-01-19 ENCOUNTER — Other Ambulatory Visit: Payer: Self-pay

## 2023-01-19 ENCOUNTER — Ambulatory Visit
Admission: RE | Admit: 2023-01-19 | Discharge: 2023-01-19 | Disposition: A | Discharge status: Home / Self Care | Payer: Medicare Other | Source: Ambulatory Visit | Attending: Radiation Oncology | Admitting: Radiation Oncology

## 2023-01-19 DIAGNOSIS — Z510 Encounter for antineoplastic radiation therapy: Secondary | ICD-10-CM | POA: Diagnosis not present

## 2023-01-19 LAB — RAD ONC ARIA SESSION SUMMARY
Course Elapsed Days: 8
Plan Fractions Treated to Date: 7
Plan Prescribed Dose Per Fraction: 2.66 Gy
Plan Total Fractions Prescribed: 16
Plan Total Prescribed Dose: 42.56 Gy
Reference Point Dosage Given to Date: 18.62 Gy
Reference Point Session Dosage Given: 2.66 Gy
Session Number: 7

## 2023-01-20 ENCOUNTER — Ambulatory Visit
Admission: RE | Admit: 2023-01-20 | Discharge: 2023-01-20 | Disposition: A | Discharge status: Home / Self Care | Payer: Medicare Other | Source: Ambulatory Visit | Attending: Radiation Oncology | Admitting: Radiation Oncology

## 2023-01-20 ENCOUNTER — Other Ambulatory Visit: Payer: Self-pay

## 2023-01-20 DIAGNOSIS — Z510 Encounter for antineoplastic radiation therapy: Secondary | ICD-10-CM | POA: Diagnosis not present

## 2023-01-20 LAB — RAD ONC ARIA SESSION SUMMARY
Course Elapsed Days: 9
Plan Fractions Treated to Date: 8
Plan Prescribed Dose Per Fraction: 2.66 Gy
Plan Total Fractions Prescribed: 16
Plan Total Prescribed Dose: 42.56 Gy
Reference Point Dosage Given to Date: 21.28 Gy
Reference Point Session Dosage Given: 2.66 Gy
Session Number: 8

## 2023-01-21 ENCOUNTER — Other Ambulatory Visit: Payer: Self-pay

## 2023-01-21 ENCOUNTER — Ambulatory Visit
Admission: RE | Admit: 2023-01-21 | Discharge: 2023-01-21 | Disposition: A | Discharge status: Home / Self Care | Payer: Medicare Other | Source: Ambulatory Visit | Attending: Radiation Oncology | Admitting: Radiation Oncology

## 2023-01-21 DIAGNOSIS — Z510 Encounter for antineoplastic radiation therapy: Secondary | ICD-10-CM | POA: Diagnosis not present

## 2023-01-21 LAB — RAD ONC ARIA SESSION SUMMARY
Course Elapsed Days: 10
Plan Fractions Treated to Date: 9
Plan Prescribed Dose Per Fraction: 2.66 Gy
Plan Total Fractions Prescribed: 16
Plan Total Prescribed Dose: 42.56 Gy
Reference Point Dosage Given to Date: 23.94 Gy
Reference Point Session Dosage Given: 2.66 Gy
Session Number: 9

## 2023-01-22 ENCOUNTER — Ambulatory Visit
Admission: RE | Admit: 2023-01-22 | Discharge: 2023-01-22 | Disposition: A | Discharge status: Home / Self Care | Payer: Medicare Other | Source: Ambulatory Visit | Attending: Radiation Oncology | Admitting: Radiation Oncology

## 2023-01-22 ENCOUNTER — Other Ambulatory Visit: Payer: Self-pay

## 2023-01-22 DIAGNOSIS — D0511 Intraductal carcinoma in situ of right breast: Secondary | ICD-10-CM | POA: Insufficient documentation

## 2023-01-22 LAB — RAD ONC ARIA SESSION SUMMARY
Course Elapsed Days: 11
Plan Fractions Treated to Date: 10
Plan Prescribed Dose Per Fraction: 2.66 Gy
Plan Total Fractions Prescribed: 16
Plan Total Prescribed Dose: 42.56 Gy
Reference Point Dosage Given to Date: 26.6 Gy
Reference Point Session Dosage Given: 2.66 Gy
Session Number: 10

## 2023-01-25 ENCOUNTER — Ambulatory Visit
Admission: RE | Admit: 2023-01-25 | Discharge: 2023-01-25 | Disposition: A | Discharge status: Home / Self Care | Payer: Medicare Other | Source: Ambulatory Visit | Attending: Radiation Oncology | Admitting: Radiation Oncology

## 2023-01-25 ENCOUNTER — Other Ambulatory Visit: Payer: Self-pay

## 2023-01-25 DIAGNOSIS — D0511 Intraductal carcinoma in situ of right breast: Secondary | ICD-10-CM | POA: Diagnosis not present

## 2023-01-25 LAB — RAD ONC ARIA SESSION SUMMARY
Course Elapsed Days: 14
Plan Fractions Treated to Date: 11
Plan Prescribed Dose Per Fraction: 2.66 Gy
Plan Total Fractions Prescribed: 16
Plan Total Prescribed Dose: 42.56 Gy
Reference Point Dosage Given to Date: 29.26 Gy
Reference Point Session Dosage Given: 2.66 Gy
Session Number: 11

## 2023-01-26 ENCOUNTER — Other Ambulatory Visit: Payer: Self-pay

## 2023-01-26 ENCOUNTER — Ambulatory Visit
Admission: RE | Admit: 2023-01-26 | Discharge: 2023-01-26 | Disposition: A | Discharge status: Home / Self Care | Payer: Medicare Other | Source: Ambulatory Visit | Attending: Radiation Oncology | Admitting: Radiation Oncology

## 2023-01-26 DIAGNOSIS — D0511 Intraductal carcinoma in situ of right breast: Secondary | ICD-10-CM | POA: Diagnosis not present

## 2023-01-26 LAB — RAD ONC ARIA SESSION SUMMARY
Course Elapsed Days: 15
Plan Fractions Treated to Date: 12
Plan Prescribed Dose Per Fraction: 2.66 Gy
Plan Total Fractions Prescribed: 16
Plan Total Prescribed Dose: 42.56 Gy
Reference Point Dosage Given to Date: 31.92 Gy
Reference Point Session Dosage Given: 2.66 Gy
Session Number: 12

## 2023-01-27 ENCOUNTER — Ambulatory Visit
Admission: RE | Admit: 2023-01-27 | Discharge: 2023-01-27 | Disposition: A | Discharge status: Home / Self Care | Payer: Medicare Other | Source: Ambulatory Visit | Attending: Radiation Oncology | Admitting: Radiation Oncology

## 2023-01-27 ENCOUNTER — Other Ambulatory Visit: Payer: Self-pay

## 2023-01-27 DIAGNOSIS — D0511 Intraductal carcinoma in situ of right breast: Secondary | ICD-10-CM | POA: Diagnosis not present

## 2023-01-27 LAB — RAD ONC ARIA SESSION SUMMARY
Course Elapsed Days: 16
Plan Fractions Treated to Date: 13
Plan Prescribed Dose Per Fraction: 2.66 Gy
Plan Total Fractions Prescribed: 16
Plan Total Prescribed Dose: 42.56 Gy
Reference Point Dosage Given to Date: 34.58 Gy
Reference Point Session Dosage Given: 2.66 Gy
Session Number: 13

## 2023-01-28 ENCOUNTER — Other Ambulatory Visit: Payer: Self-pay

## 2023-01-28 ENCOUNTER — Ambulatory Visit
Admission: RE | Admit: 2023-01-28 | Discharge: 2023-01-28 | Disposition: A | Discharge status: Home / Self Care | Payer: Medicare Other | Source: Ambulatory Visit | Attending: Radiation Oncology | Admitting: Radiation Oncology

## 2023-01-28 DIAGNOSIS — D0511 Intraductal carcinoma in situ of right breast: Secondary | ICD-10-CM | POA: Diagnosis not present

## 2023-01-28 LAB — RAD ONC ARIA SESSION SUMMARY
Course Elapsed Days: 17
Plan Fractions Treated to Date: 14
Plan Prescribed Dose Per Fraction: 2.66 Gy
Plan Total Fractions Prescribed: 16
Plan Total Prescribed Dose: 42.56 Gy
Reference Point Dosage Given to Date: 37.24 Gy
Reference Point Session Dosage Given: 2.66 Gy
Session Number: 14

## 2023-01-29 ENCOUNTER — Ambulatory Visit
Admission: RE | Admit: 2023-01-29 | Discharge: 2023-01-29 | Disposition: A | Discharge status: Home / Self Care | Payer: Medicare Other | Source: Ambulatory Visit | Attending: Radiation Oncology | Admitting: Radiation Oncology

## 2023-01-29 ENCOUNTER — Ambulatory Visit: Payer: Medicare Other | Admitting: Radiation Oncology

## 2023-01-29 ENCOUNTER — Other Ambulatory Visit: Payer: Self-pay

## 2023-01-29 DIAGNOSIS — D0511 Intraductal carcinoma in situ of right breast: Secondary | ICD-10-CM | POA: Diagnosis not present

## 2023-01-29 LAB — RAD ONC ARIA SESSION SUMMARY
Course Elapsed Days: 18
Plan Fractions Treated to Date: 15
Plan Prescribed Dose Per Fraction: 2.66 Gy
Plan Total Fractions Prescribed: 16
Plan Total Prescribed Dose: 42.56 Gy
Reference Point Dosage Given to Date: 39.9 Gy
Reference Point Session Dosage Given: 2.66 Gy
Session Number: 15

## 2023-01-29 MED ORDER — RADIAPLEXRX EX GEL: Freq: Once | CUTANEOUS | Status: AC

## 2023-02-01 ENCOUNTER — Ambulatory Visit
Admission: RE | Admit: 2023-02-01 | Discharge: 2023-02-01 | Disposition: A | Discharge status: Home / Self Care | Payer: Medicare Other | Source: Ambulatory Visit | Attending: Radiation Oncology | Admitting: Radiation Oncology

## 2023-02-01 ENCOUNTER — Other Ambulatory Visit: Payer: Self-pay

## 2023-02-01 DIAGNOSIS — D0511 Intraductal carcinoma in situ of right breast: Secondary | ICD-10-CM | POA: Diagnosis not present

## 2023-02-01 LAB — RAD ONC ARIA SESSION SUMMARY
Course Elapsed Days: 21
Plan Fractions Treated to Date: 16
Plan Prescribed Dose Per Fraction: 2.66 Gy
Plan Total Fractions Prescribed: 16
Plan Total Prescribed Dose: 42.56 Gy
Reference Point Dosage Given to Date: 42.56 Gy
Reference Point Session Dosage Given: 2.66 Gy
Session Number: 16

## 2023-02-02 ENCOUNTER — Other Ambulatory Visit: Payer: Self-pay

## 2023-02-02 ENCOUNTER — Ambulatory Visit
Admission: RE | Admit: 2023-02-02 | Discharge: 2023-02-02 | Disposition: A | Discharge status: Home / Self Care | Payer: Medicare Other | Source: Ambulatory Visit | Attending: Radiation Oncology | Admitting: Radiation Oncology

## 2023-02-02 DIAGNOSIS — D0511 Intraductal carcinoma in situ of right breast: Secondary | ICD-10-CM | POA: Diagnosis not present

## 2023-02-02 LAB — RAD ONC ARIA SESSION SUMMARY
Course Elapsed Days: 22
Plan Fractions Treated to Date: 1
Plan Prescribed Dose Per Fraction: 2 Gy
Plan Total Fractions Prescribed: 4
Plan Total Prescribed Dose: 8 Gy
Reference Point Dosage Given to Date: 2 Gy
Reference Point Session Dosage Given: 2 Gy
Session Number: 17

## 2023-02-03 ENCOUNTER — Other Ambulatory Visit: Payer: Self-pay

## 2023-02-03 ENCOUNTER — Ambulatory Visit
Admission: RE | Admit: 2023-02-03 | Discharge: 2023-02-03 | Disposition: A | Discharge status: Home / Self Care | Payer: Medicare Other | Source: Ambulatory Visit | Attending: Radiation Oncology | Admitting: Radiation Oncology

## 2023-02-03 DIAGNOSIS — D0511 Intraductal carcinoma in situ of right breast: Secondary | ICD-10-CM | POA: Diagnosis not present

## 2023-02-03 LAB — RAD ONC ARIA SESSION SUMMARY
Course Elapsed Days: 23
Plan Fractions Treated to Date: 2
Plan Prescribed Dose Per Fraction: 2 Gy
Plan Total Fractions Prescribed: 4
Plan Total Prescribed Dose: 8 Gy
Reference Point Dosage Given to Date: 4 Gy
Reference Point Session Dosage Given: 2 Gy
Session Number: 18

## 2023-02-04 ENCOUNTER — Ambulatory Visit
Admission: RE | Admit: 2023-02-04 | Discharge: 2023-02-04 | Disposition: A | Discharge status: Home / Self Care | Payer: Medicare Other | Source: Ambulatory Visit | Attending: Radiation Oncology | Admitting: Radiation Oncology

## 2023-02-04 ENCOUNTER — Other Ambulatory Visit: Payer: Self-pay

## 2023-02-04 DIAGNOSIS — D0511 Intraductal carcinoma in situ of right breast: Secondary | ICD-10-CM | POA: Diagnosis not present

## 2023-02-04 LAB — RAD ONC ARIA SESSION SUMMARY
Course Elapsed Days: 24
Plan Fractions Treated to Date: 3
Plan Prescribed Dose Per Fraction: 2 Gy
Plan Total Fractions Prescribed: 4
Plan Total Prescribed Dose: 8 Gy
Reference Point Dosage Given to Date: 6 Gy
Reference Point Session Dosage Given: 2 Gy
Session Number: 19

## 2023-02-05 ENCOUNTER — Other Ambulatory Visit: Payer: Self-pay

## 2023-02-05 ENCOUNTER — Ambulatory Visit
Admission: RE | Admit: 2023-02-05 | Discharge: 2023-02-05 | Disposition: A | Discharge status: Home / Self Care | Payer: Medicare Other | Source: Ambulatory Visit | Attending: Radiation Oncology | Admitting: Radiation Oncology

## 2023-02-05 ENCOUNTER — Encounter: Payer: Self-pay | Admitting: Radiation Oncology

## 2023-02-05 DIAGNOSIS — D0511 Intraductal carcinoma in situ of right breast: Secondary | ICD-10-CM | POA: Diagnosis not present

## 2023-02-05 LAB — RAD ONC ARIA SESSION SUMMARY
Course Elapsed Days: 25
Plan Fractions Treated to Date: 4
Plan Prescribed Dose Per Fraction: 2 Gy
Plan Total Fractions Prescribed: 4
Plan Total Prescribed Dose: 8 Gy
Reference Point Dosage Given to Date: 8 Gy
Reference Point Session Dosage Given: 2 Gy
Session Number: 20

## 2023-02-07 DIAGNOSIS — C73 Malignant neoplasm of thyroid gland: Secondary | ICD-10-CM | POA: Insufficient documentation

## 2023-02-07 NOTE — Assessment & Plan Note (Signed)
-  Patient had hysterectomy and BSO in November 2021 for postmenopausal vaginal bleeding.  The surgical path incidentally showed metastatic papillary thyroid carcinoma in the left ovary  -Patient underwent ultrasound of the thyroid after surgery which showed bilateral nodules, she underwent a FNA of thyroid nodule which was negative for malignant cells.   -I referred her to endocrine clinic

## 2023-02-07 NOTE — Assessment & Plan Note (Addendum)
-  ER+/PR+ -Diagnosed in 10/2022 -Status post right lumpectomy with negative margins (posterior margin <0.62mm) -She completed adjuvant radiation last week, overall tolerated well. -I discussed chemoprevention with tamoxifen, to reduce her risk of future breast cancer.  I recommend low-dose of 5 mg daily for 3 years.  Benefit and potential side effect discussed with her in detail, she is interested.

## 2023-02-08 ENCOUNTER — Encounter: Payer: Self-pay | Admitting: *Deleted

## 2023-02-08 ENCOUNTER — Inpatient Hospital Stay: Payer: Medicare Other | Attending: Hematology | Admitting: Hematology

## 2023-02-08 ENCOUNTER — Encounter: Payer: Self-pay | Admitting: Hematology

## 2023-02-08 ENCOUNTER — Ambulatory Visit: Payer: Medicare Other | Admitting: Hematology

## 2023-02-08 VITALS — BP 182/86 | HR 73 | Temp 98.1°F | Resp 18 | Ht 65.0 in | Wt 218.4 lb

## 2023-02-08 DIAGNOSIS — D0511 Intraductal carcinoma in situ of right breast: Secondary | ICD-10-CM | POA: Diagnosis present

## 2023-02-08 DIAGNOSIS — Z79899 Other long term (current) drug therapy: Secondary | ICD-10-CM | POA: Diagnosis not present

## 2023-02-08 DIAGNOSIS — Z90710 Acquired absence of both cervix and uterus: Secondary | ICD-10-CM | POA: Insufficient documentation

## 2023-02-08 DIAGNOSIS — Z90722 Acquired absence of ovaries, bilateral: Secondary | ICD-10-CM | POA: Insufficient documentation

## 2023-02-08 DIAGNOSIS — Z170 Estrogen receptor positive status [ER+]: Secondary | ICD-10-CM | POA: Insufficient documentation

## 2023-02-08 DIAGNOSIS — E042 Nontoxic multinodular goiter: Secondary | ICD-10-CM | POA: Insufficient documentation

## 2023-02-08 DIAGNOSIS — Z923 Personal history of irradiation: Secondary | ICD-10-CM | POA: Insufficient documentation

## 2023-02-08 DIAGNOSIS — C7962 Secondary malignant neoplasm of left ovary: Secondary | ICD-10-CM | POA: Insufficient documentation

## 2023-02-08 DIAGNOSIS — C73 Malignant neoplasm of thyroid gland: Secondary | ICD-10-CM | POA: Diagnosis not present

## 2023-02-08 DIAGNOSIS — Z79810 Long term (current) use of selective estrogen receptor modulators (SERMs): Secondary | ICD-10-CM | POA: Diagnosis not present

## 2023-02-08 MED ORDER — TAMOXIFEN CITRATE 10 MG PO TABS: 5.0000 mg | ORAL_TABLET | Freq: Every day | ORAL | 1 refills | Status: DC

## 2023-02-08 MED ORDER — TAMOXIFEN CITRATE 10 MG PO TABS: 5.0000 mg | ORAL_TABLET | Freq: Two times a day (BID) | ORAL | 1 refills | Status: DC

## 2023-02-08 NOTE — Progress Notes (Signed)
[facility]   Telephone:(336) [phone] Fax:([phone]) [phone]   Clinic Follow up Note   Patient Care Team: Glendale Chard, MD as PCP - General (Internal Medicine) Rockwell Germany, RN as Oncology Nurse Navigator Rockwell Germany, RN as Oncology Nurse Navigator Coralie Keens, MD as Consulting Physician (General Surgery) Truitt Merle, MD as Consulting Physician (Hematology) Kyung Rudd, MD as Consulting Physician (Radiation Oncology)  Date of Service:  02/08/2023  CHIEF COMPLAINT: f/u of Breast Cancer, ER DCIS   CURRENT THERAPY:  Tamoxifen 5 mg daily starting 01/2023  ASSESSMENT:  Candice Ayala is a 67 y.o. female with   Ductal carcinoma in situ (DCIS) of right breast -ER+/PR+ -Diagnosed in 10/2022 -Status post right lumpectomy with negative margins (posterior margin <0.37mm) -She completed adjuvant radiation last week, overall tolerated well. -I discussed chemoprevention with tamoxifen, to reduce her risk of future breast cancer.  I recommend low-dose of 5 mg daily for 3 years.  Benefit and potential side effect discussed with her in detail, she is interested.  Papillary thyroid carcinoma [facility]) -Patient had hysterectomy and BSO in November 2021 for postmenopausal vaginal bleeding.  The surgical path incidentally showed metastatic papillary thyroid carcinoma in the left ovary  -Patient underwent ultrasound of the thyroid after surgery which showed bilateral nodules, she underwent a FNA of thyroid nodule which was negative for malignant cells.   -I referred her to endocrine clinic      PLAN: -discuss low doseTamoxifen for 3 years, its benefits and side effects. -Discuss changing lifestyle through diet and exercise and try to loose some weight. -Start off 5 mg of Tamoxifen  daily next month  -Survivorship with NP in 3 months, lab and follow-up with me in 6 months.    SUMMARY OF ONCOLOGIC HISTORY: Oncology History  Ductal carcinoma in situ (DCIS) of right  breast  11/09/2022 Initial Diagnosis   Ductal carcinoma in situ (DCIS) of right breast    Imaging     12/03/2022 Pathology Results    FINAL MICROSCOPIC DIAGNOSIS:   A. BREAST, RIGHT W/SEEDS, LUMPECTOMY:  - Ductal carcinoma in situ, intermediate grade with calcifications,  approximately measuring 4 cm, see comment   - DCIS is less than 0.1 mm from the posterior margin (barbell clip)  - Biopsy site changes  - No evidence of invasive carcinoma  - See oncology table       INTERVAL HISTORY:  Candice Ayala is here for a follow up of Breast Cancer, ER DCIS  She was last seen by me on 11/11/2022 She presents to the clinic alone.Pt state she finished radiation Last Friday. Pt state she is a little fatigue, far as her skin just tight.Pt state that her surgery went well.      All other systems were reviewed with the patient and are negative.  MEDICAL HISTORY:  Past Medical History:  Diagnosis Date   Allergy    Arthritis    Breast cancer ([place])    Hypertension    Malignant struma ovarii of left ovary ([place]) 10/22/2020   PONV (postoperative nausea and vomiting)    Pre-diabetes     SURGICAL HISTORY: Past Surgical History:  Procedure Laterality Date   BREAST BIOPSY Right 11/27/2013   Procedure: RIGHT BREAST BIOPSY WITH NEEDLE LOCALIZATION;  Surgeon: Odis Hollingshead, MD;  Location: [place];  Service: General;  Laterality: Right;   BREAST LUMPECTOMY WITH RADIOACTIVE SEED LOCALIZATION Right 12/03/2022   Procedure: RIGHT BREAST LUMPECTOMY WITH RADIOACTIVE SEED LOCALIZATION X2;  Surgeon: Coralie Keens, MD;  Location: [place];  Service: General;  Laterality: Right;   COLONOSCOPY     JOINT REPLACEMENT  2004   right total hip   LYSIS OF ADHESION N/A 10/01/2020   Procedure: LYSIS OF ADHESION;  Surgeon: Everitt Amber, MD;  [facility];  Service: Gynecology;  Laterality: N/A;   OVARIAN CYST REMOVAL  1980   ROBOTIC ASSISTED TOTAL HYSTERECTOMY WITH BILATERAL  SALPINGO OOPHERECTOMY N/A 10/01/2020   Procedure: XI ROBOTIC ASSISTED TOTAL HYSTERECTOMY WITH UNILATERL  SALPINGO OOPHORECTOMY;  Surgeon: Everitt Amber, MD;  [facility];  Service: Gynecology;  Laterality: N/A;   TOTAL HIP ARTHROPLASTY Right 2004   TOTAL HIP ARTHROPLASTY Left 02/12/2022    I have reviewed the social history and family history with the patient and they are unchanged from previous note.  ALLERGIES:  is allergic to contrast media [iodinated contrast media].  MEDICATIONS:  Current Outpatient Medications  Medication Sig Dispense Refill   diphenhydrAMINE (BENADRYL) 25 MG tablet Take 25 mg by mouth every 8 (eight) hours as needed for allergies.     ibuprofen (ADVIL) 200 MG tablet Take 200 mg by mouth every 6 (six) hours as needed for headache or moderate pain.     loratadine (CLARITIN) 10 MG tablet Take 10 mg by mouth daily as needed for allergies.     Multiple Vitamins-Minerals (MULTIVITAMIN WITH MINERALS) tablet Take 1 tablet by mouth daily.     tamoxifen (NOLVADEX) 10 MG tablet Take 0.5 tablets (5 mg total) by mouth daily. 30 tablet 1   traMADol (ULTRAM) 50 MG tablet Take 1 tablet (50 mg total) by mouth every 6 (six) hours as needed for moderate pain or severe pain. 20 tablet 0   Turmeric 500 MG CAPS Take 500 mg by mouth 3 (three) times a week.     valsartan-hydrochlorothiazide (DIOVAN-HCT) 320-25 MG tablet Take 1 tablet by mouth daily. 90 tablet 1   No current facility-administered medications for this visit.    PHYSICAL EXAMINATION: ECOG PERFORMANCE STATUS: 0 - Asymptomatic  Vitals:   02/08/23 1418  BP: (!) 182/86  Pulse: 73  Resp: 18  Temp: 98.1 F (36.7 C)  SpO2: 100%   Wt Readings from Last 3 Encounters:  02/08/23 218 lb 6.4 oz (99.1 kg)  12/31/22 212 lb (96.2 kg)  12/03/22 216 lb (98 kg)     NECK:(-) supple, thyroid normal size, non-tender, without nodularity LYMPH: (-)  no palpable lymphadenopathy in the cervical, axillary  BREAST:RT breast   lumpectomy darker than normal hardness of breast is scar tissue. Lt breast no palpable mass . Breast exam benign  LABORATORY DATA:  I have reviewed the data as listed    Latest Ref Rng & Units 11/11/2022    8:24 AM 01/05/2022   12:32 PM 09/20/2020    8:50 AM  CBC  WBC 4.0 - 10.5 K/uL 6.5  5.6  6.3   Hemoglobin 12.0 - 15.0 g/dL 12.6  12.5  12.6   Hematocrit 36.0 - 46.0 % 37.8  37.6  38.8   Platelets 150 - 400 K/uL 363  399  354         Latest Ref Rng & Units 11/11/2022    8:24 AM 08/12/2022    2:36 PM 04/22/2022    3:45 PM  CMP  Glucose 70 - 99 mg/dL 128  107  87   BUN 8 - 23 mg/dL 12  14  10    Creatinine 0.44 - 1.00 mg/dL 0.78  0.80  0.63   Sodium 135 - 145 mmol/L 141  141  140   Potassium 3.5 - 5.1 mmol/L 4.2  4.2  4.1   Chloride 98 - 111 mmol/L 106  100  102   CO2 22 - 32 mmol/L 28  25  22    Calcium 8.9 - 10.3 mg/dL 9.8  9.7  9.6   Total Protein 6.5 - 8.1 g/dL 7.5     Total Bilirubin 0.3 - 1.2 mg/dL 0.7     Alkaline Phos 38 - 126 U/L 81     AST 15 - 41 U/L 18     ALT 0 - 44 U/L 15         RADIOGRAPHIC STUDIES: I have personally reviewed the radiological images as listed and agreed with the findings in the report. No results found.    No orders of the defined types were placed in this encounter.  All questions were answered. The patient knows to call the clinic with any problems, questions or concerns. No barriers to learning was detected. The total time spent in the appointment was 30 minutes.     Truitt Merle, MD 02/08/2023   Felicity Coyer, CMA, am acting as scribe for Truitt Merle, MD.   I have reviewed the above documentation for accuracy and completeness, and I agree with the above.

## 2023-02-08 NOTE — Progress Notes (Signed)
                                                                                                                                                             Patient Name: Candice Ayala MRN: [id number] DOB: 1956/02/07 Referring Physician: Glendale Chard (Profile Not Attached) Date of Service: 02/05/2023 [facility], [place]                                                        End Of Treatment Note  Diagnoses: D05.11-Intraductal carcinoma in situ of right breast  Cancer Staging:   Intermediate Grade, ER/PR positive DCIS of the right breast.   Intent: Curative  Radiation Treatment Dates: 01/11/2023 through 02/05/2023 Site Technique Total Dose (Gy) Dose per Fx (Gy) Completed Fx Beam Energies  Breast, Right: Breast_R 3D 42.56/42.56 2.66 16/16 10X  Breast, Right: Breast_R_Bst 3D 8/8 2 4/4 6X   Narrative: The patient tolerated radiation therapy relatively well. She developed fatigue and anticipated skin changes in the treatment field.   Plan: The patient will receive a call in about one month from the radiation oncology department. She will continue follow up with Dr. Burr Medico as well.   ________________________________________________    Carola Rhine, [facility]

## 2023-03-17 ENCOUNTER — Ambulatory Visit: Payer: Medicare Other | Admitting: Internal Medicine

## 2023-03-17 ENCOUNTER — Ambulatory Visit (INDEPENDENT_AMBULATORY_CARE_PROVIDER_SITE_OTHER): Payer: Medicare Other

## 2023-03-17 ENCOUNTER — Encounter: Payer: Self-pay | Admitting: Internal Medicine

## 2023-03-17 VITALS — BP 148/98 | HR 67 | Temp 98.1°F | Ht 65.0 in | Wt 214.0 lb

## 2023-03-17 VITALS — BP 148/98 | HR 67 | Temp 98.1°F | Ht 65.0 in | Wt 214.6 lb

## 2023-03-17 DIAGNOSIS — D0511 Intraductal carcinoma in situ of right breast: Secondary | ICD-10-CM | POA: Diagnosis not present

## 2023-03-17 DIAGNOSIS — R7309 Other abnormal glucose: Secondary | ICD-10-CM

## 2023-03-17 DIAGNOSIS — Z6835 Body mass index (BMI) 35.0-35.9, adult: Secondary | ICD-10-CM

## 2023-03-17 DIAGNOSIS — Z2821 Immunization not carried out because of patient refusal: Secondary | ICD-10-CM

## 2023-03-17 DIAGNOSIS — I10 Essential (primary) hypertension: Secondary | ICD-10-CM | POA: Diagnosis not present

## 2023-03-17 DIAGNOSIS — Z0000 Encounter for general adult medical examination without abnormal findings: Secondary | ICD-10-CM

## 2023-03-17 LAB — CMP14+EGFR
ALT: 12 IU/L (ref 0–32)
AST: 17 IU/L (ref 0–40)
Albumin/Globulin Ratio: 1.6 (ref 1.2–2.2)
Albumin: 4.2 g/dL (ref 3.9–4.9)
Alkaline Phosphatase: 112 IU/L (ref 44–121)
BUN/Creatinine Ratio: 15 (ref 12–28)
BUN: 11 mg/dL (ref 8–27)
Bilirubin Total: 0.4 mg/dL (ref 0.0–1.2)
CO2: 21 mmol/L (ref 20–29)
Calcium: 10.1 mg/dL (ref 8.7–10.3)
Chloride: 103 mmol/L (ref 96–106)
Creatinine, Ser: 0.73 mg/dL (ref 0.57–1.00)
Globulin, Total: 2.7 g/dL (ref 1.5–4.5)
Glucose: 90 mg/dL (ref 70–99)
Potassium: 4.4 mmol/L (ref 3.5–5.2)
Sodium: 141 mmol/L (ref 134–144)
Total Protein: 6.9 g/dL (ref 6.0–8.5)
eGFR: 89 mL/min/{1.73_m2} (ref >59)

## 2023-03-17 LAB — LIPID PANEL
Chol/HDL Ratio: 3.1 ratio (ref 0.0–4.4)
Cholesterol, Total: 232 mg/dL — ABNORMAL HIGH (ref 100–199)
HDL: 76 mg/dL (ref >39)
LDL Chol Calc (NIH): 143 mg/dL — ABNORMAL HIGH (ref 0–99)
Triglycerides: 73 mg/dL (ref 0–149)
VLDL Cholesterol Cal: 13 mg/dL (ref 5–40)

## 2023-03-17 LAB — HEMOGLOBIN A1C
Est. average glucose Bld gHb Est-mCnc: 134 mg/dL
Hgb A1c MFr Bld: 6.3 % — ABNORMAL HIGH (ref 4.8–5.6)

## 2023-03-17 MED ORDER — VALSARTAN-HYDROCHLOROTHIAZIDE 320-25 MG PO TABS: 1.0000 | ORAL_TABLET | Freq: Every day | ORAL | 2 refills | Status: DC

## 2023-03-17 MED ORDER — AMLODIPINE BESYLATE 5 MG PO TABS: 5.0000 mg | ORAL_TABLET | Freq: Every day | ORAL | 11 refills | Status: DC

## 2023-03-17 NOTE — Patient Instructions (Signed)
Candice Ayala , Thank you for taking time to come for your Medicare Wellness Visit. I appreciate your ongoing commitment to your health goals. Please review the following plan we discussed and let me know if I can assist you in the future.   These are the goals we discussed:  Goals      Exercise 150 min/wk Moderate Activity     Patient Stated     02/26/2021, wants to lose 15 pounds and get BP under better control     Patient Stated     03/11/2022, lose a little more weight     Patient Stated     03/17/2023, wants to get BP down and make diet adjustments     Weight (lb) < 200 lb (90.7 kg)        This is a list of the screening recommended for you and due dates:  Health Maintenance  Topic Date Due   COVID-19 Vaccine (5 - 2023-24 season) 07/24/2022   Zoster (Shingles) Vaccine (1 of 2) 06/16/2023*   Flu Shot  06/24/2023   Medicare Annual Wellness Visit  03/16/2024   Mammogram  10/29/2024   Colon Cancer Screening  09/04/2026   DTaP/Tdap/Td vaccine (2 - Td or Tdap) 01/06/2032   Pneumonia Vaccine  Completed   DEXA scan (bone density measurement)  Completed   Hepatitis C Screening: USPSTF Recommendation to screen - Ages 48-79 yo.  Completed   HPV Vaccine  Aged Out  *Topic was postponed. The date shown is not the original due date.    Advanced directives: Advance directive discussed with you today. Even though you declined this today please call our office should you change your mind and we can give you the proper paperwork for you to fill out.  Conditions/risks identified: none  Next appointment: Follow up in one year for your annual wellness visit    Preventive Care 65 Years and Older, Female Preventive care refers to lifestyle choices and visits with your health care provider that can promote health and wellness. What does preventive care include? A yearly physical exam. This is also called an annual well check. Dental exams once or twice a year. Routine eye exams. Ask your  health care provider how often you should have your eyes checked. Personal lifestyle choices, including: Daily care of your teeth and gums. Regular physical activity. Eating a healthy diet. Avoiding tobacco and drug use. Limiting alcohol use. Practicing safe sex. Taking low-dose aspirin every day. Taking vitamin and mineral supplements as recommended by your health care provider. What happens during an annual well check? The services and screenings done by your health care provider during your annual well check will depend on your age, overall health, lifestyle risk factors, and family history of disease. Counseling  Your health care provider may ask you questions about your: Alcohol use. Tobacco use. Drug use. Emotional well-being. Home and relationship well-being. Sexual activity. Eating habits. History of falls. Memory and ability to understand (cognition). Work and work Astronomer. Reproductive health. Screening  You may have the following tests or measurements: Height, weight, and BMI. Blood pressure. Lipid and cholesterol levels. These may be checked every 5 years, or more frequently if you are over 50 years old. Skin check. Lung cancer screening. You may have this screening every year starting at age 75 if you have a 30-pack-year history of smoking and currently smoke or have quit within the past 15 years. Fecal occult blood test (FOBT) of the stool. You may have this test  every year starting at age 100. Flexible sigmoidoscopy or colonoscopy. You may have a sigmoidoscopy every 5 years or a colonoscopy every 10 years starting at age 22. Hepatitis C blood test. Hepatitis B blood test. Sexually transmitted disease (STD) testing. Diabetes screening. This is done by checking your blood sugar (glucose) after you have not eaten for a while (fasting). You may have this done every 1-3 years. Bone density scan. This is done to screen for osteoporosis. You may have this done  starting at age 65. Mammogram. This may be done every 1-2 years. Talk to your health care provider about how often you should have regular mammograms. Talk with your health care provider about your test results, treatment options, and if necessary, the need for more tests. Vaccines  Your health care provider may recommend certain vaccines, such as: Influenza vaccine. This is recommended every year. Tetanus, diphtheria, and acellular pertussis (Tdap, Td) vaccine. You may need a Td booster every 10 years. Zoster vaccine. You may need this after age 29. Pneumococcal 13-valent conjugate (PCV13) vaccine. One dose is recommended after age 24. Pneumococcal polysaccharide (PPSV23) vaccine. One dose is recommended after age 61. Talk to your health care provider about which screenings and vaccines you need and how often you need them. This information is not intended to replace advice given to you by your health care provider. Make sure you discuss any questions you have with your health care provider. Document Released: 12/06/2015 Document Revised: 07/29/2016 Document Reviewed: 09/10/2015 Elsevier Interactive Patient Education  2017 [employer] Prevention in the Home Falls can cause injuries. They can happen to people of all ages. There are many things you can do to make your home safe and to help prevent falls. What can I do on the outside of my home? Regularly fix the edges of walkways and driveways and fix any cracks. Remove anything that might make you trip as you walk through a door, such as a raised step or threshold. Trim any bushes or trees on the path to your home. Use bright outdoor lighting. Clear any walking paths of anything that might make someone trip, such as rocks or tools. Regularly check to see if handrails are loose or broken. Make sure that both sides of any steps have handrails. Any raised decks and porches should have guardrails on the edges. Have any leaves, snow, or  ice cleared regularly. Use sand or salt on walking paths during winter. Clean up any spills in your garage right away. This includes oil or grease spills. What can I do in the bathroom? Use night lights. Install grab bars by the toilet and in the tub and shower. Do not use towel bars as grab bars. Use non-skid mats or decals in the tub or shower. If you need to sit down in the shower, use a plastic, non-slip stool. Keep the floor dry. Clean up any water that spills on the floor as soon as it happens. Remove soap buildup in the tub or shower regularly. Attach bath mats securely with double-sided non-slip rug tape. Do not have throw rugs and other things on the floor that can make you trip. What can I do in the bedroom? Use night lights. Make sure that you have a light by your bed that is easy to reach. Do not use any sheets or blankets that are too big for your bed. They should not hang down onto the floor. Have a firm chair that has side arms. You can use  this for support while you get dressed. Do not have throw rugs and other things on the floor that can make you trip. What can I do in the kitchen? Clean up any spills right away. Avoid walking on wet floors. Keep items that you use a lot in easy-to-reach places. If you need to reach something above you, use a strong step stool that has a grab bar. Keep electrical cords out of the way. Do not use floor polish or wax that makes floors slippery. If you must use wax, use non-skid floor wax. Do not have throw rugs and other things on the floor that can make you trip. What can I do with my stairs? Do not leave any items on the stairs. Make sure that there are handrails on both sides of the stairs and use them. Fix handrails that are broken or loose. Make sure that handrails are as long as the stairways. Check any carpeting to make sure that it is firmly attached to the stairs. Fix any carpet that is loose or worn. Avoid having throw rugs at  the top or bottom of the stairs. If you do have throw rugs, attach them to the floor with carpet tape. Make sure that you have a light switch at the top of the stairs and the bottom of the stairs. If you do not have them, ask someone to add them for you. What else can I do to help prevent falls? Wear shoes that: Do not have high heels. Have rubber bottoms. Are comfortable and fit you well. Are closed at the toe. Do not wear sandals. If you use a stepladder: Make sure that it is fully opened. Do not climb a closed stepladder. Make sure that both sides of the stepladder are locked into place. Ask someone to hold it for you, if possible. Clearly mark and make sure that you can see: Any grab bars or handrails. First and last steps. Where the edge of each step is. Use tools that help you move around (mobility aids) if they are needed. These include: Canes. Walkers. Scooters. Crutches. Turn on the lights when you go into a dark area. Replace any light bulbs as soon as they burn out. Set up your furniture so you have a clear path. Avoid moving your furniture around. If any of your floors are uneven, fix them. If there are any pets around you, be aware of where they are. Review your medicines with your doctor. Some medicines can make you feel dizzy. This can increase your chance of falling. Ask your doctor what other things that you can do to help prevent falls. This information is not intended to replace advice given to you by your health care provider. Make sure you discuss any questions you have with your health care provider. Document Released: 09/05/2009 Document Revised: 04/16/2016 Document Reviewed: 12/14/2014 Elsevier Interactive Patient Education  2017 [employer].

## 2023-03-17 NOTE — Patient Instructions (Signed)
Hypertension, Adult ?Hypertension is another name for high blood pressure. High blood pressure forces your heart to work harder to pump blood. This can cause problems over time. ?There are two numbers in a blood pressure reading. There is a top number (systolic) over a bottom number (diastolic). It is best to have a blood pressure that is below 120/80. ?What are the causes? ?The cause of this condition is not known. Some other conditions can lead to high blood pressure. ?What increases the risk? ?Some lifestyle factors can make you more likely to develop high blood pressure: ?Smoking. ?Not getting enough exercise or physical activity. ?Being overweight. ?Having too much fat, sugar, calories, or salt (sodium) in your diet. ?Drinking too much alcohol. ?Other risk factors include: ?Having any of these conditions: ?Heart disease. ?Diabetes. ?High cholesterol. ?Kidney disease. ?Obstructive sleep apnea. ?Having a family history of high blood pressure and high cholesterol. ?Age. The risk increases with age. ?Stress. ?What are the signs or symptoms? ?High blood pressure may not cause symptoms. Very high blood pressure (hypertensive crisis) may cause: ?Headache. ?Fast or uneven heartbeats (palpitations). ?Shortness of breath. ?Nosebleed. ?Vomiting or feeling like you may vomit (nauseous). ?Changes in how you see. ?Very bad chest pain. ?Feeling dizzy. ?Seizures. ?How is this treated? ?This condition is treated by making healthy lifestyle changes, such as: ?Eating healthy foods. ?Exercising more. ?Drinking less alcohol. ?Your doctor may prescribe medicine if lifestyle changes do not help enough and if: ?Your top number is above 130. ?Your bottom number is above 80. ?Your personal target blood pressure may vary. ?Follow these instructions at home: ?Eating and drinking ? ?If told, follow the DASH eating plan. To follow this plan: ?Fill one half of your plate at each meal with fruits and vegetables. ?Fill one fourth of your plate  at each meal with whole grains. Whole grains include whole-wheat pasta, brown rice, and whole-grain bread. ?Eat or drink low-fat dairy products, such as skim milk or low-fat yogurt. ?Fill one fourth of your plate at each meal with low-fat (lean) proteins. Low-fat proteins include fish, chicken without skin, eggs, beans, and tofu. ?Avoid fatty meat, cured and processed meat, or chicken with skin. ?Avoid pre-made or processed food. ?Limit the amount of salt in your diet to less than 1,500 mg each day. ?Do not drink alcohol if: ?Your doctor tells you not to drink. ?You are pregnant, may be pregnant, or are planning to become pregnant. ?If you drink alcohol: ?Limit how much you have to: ?0-1 drink a day for women. ?0-2 drinks a day for men. ?Know how much alcohol is in your drink. In the [place] one drink equals one 12 oz bottle of beer (355 mL), one 5 oz glass of wine (148 mL), or one 1? oz glass of hard liquor (44 mL). ?Lifestyle ? ?Work with your doctor to stay at a healthy weight or to lose weight. Ask your doctor what the best weight is for you. ?Get at least 30 minutes of exercise that causes your heart to beat faster (aerobic exercise) most days of the week. This may include walking, swimming, or biking. ?Get at least 30 minutes of exercise that strengthens your muscles (resistance exercise) at least 3 days a week. This may include lifting weights or doing Pilates. ?Do not smoke or use any products that contain nicotine or tobacco. If you need help quitting, ask your doctor. ?Check your blood pressure at home as told by your doctor. ?Keep all follow-up visits. ?Medicines ?Take over-the-counter and prescription medicines   only as told by your doctor. Follow directions carefully. ?Do not skip doses of blood pressure medicine. The medicine does not work as well if you skip doses. Skipping doses also puts you at risk for problems. ?Ask your doctor about side effects or reactions to medicines that you should watch  for. ?Contact a doctor if: ?You think you are having a reaction to the medicine you are taking. ?You have headaches that keep coming back. ?You feel dizzy. ?You have swelling in your ankles. ?You have trouble with your vision. ?Get help right away if: ?You get a very bad headache. ?You start to feel mixed up (confused). ?You feel weak or numb. ?You feel faint. ?You have very bad pain in your: ?Chest. ?Belly (abdomen). ?You vomit more than once. ?You have trouble breathing. ?These symptoms may be an emergency. Get help right away. Call 911. ?Do not wait to see if the symptoms will go away. ?Do not drive yourself to the hospital. ?Summary ?Hypertension is another name for high blood pressure. ?High blood pressure forces your heart to work harder to pump blood. ?For most people, a normal blood pressure is less than 120/80. ?Making healthy choices can help lower blood pressure. If your blood pressure does not get lower with healthy choices, you may need to take medicine. ?This information is not intended to replace advice given to you by your health care provider. Make sure you discuss any questions you have with your health care provider. ?Document Revised: 08/28/2021 Document Reviewed: 08/28/2021 ?Elsevier Patient Education ? 2023 Elsevier Inc[employer] ? ?

## 2023-03-17 NOTE — Progress Notes (Signed)
I,[name],acting as a scribe for Gwynneth Aliment, MD.,have documented all relevant documentation on the behalf of Gwynneth Aliment, MD,as directed by  Gwynneth Aliment, MD while in the presence of Gwynneth Aliment, MD.    Subjective:     Patient ID: Candice Ayala , female    DOB: [birth year] , 67 y.o.   MRN: [id number]   Chief Complaint  Patient presents with   Hypertension    HPI  [name] She reports compliance with medications.  Denies headache, chest pain, SOB.   Since her last visit, she was diagnosed with DCIS in left breast. She had lumpectomy in January, and then completed XRT. She did plan to start tamoxifen; however, after doing further research, she is not interested in taking the medication. She prefers in making lifestyle changes and wants to work on diet/exercise.  She completed AWV with [facility], [place] earlier today.   Hypertension This is a chronic problem. The current episode started more than 1 year ago. The problem is unchanged. The problem is controlled. Pertinent negatives include no anxiety, blurred vision, chest pain, malaise/fatigue, palpitations or peripheral edema. There are no associated agents to hypertension. Risk factors for coronary artery disease include obesity and sedentary lifestyle. Past treatments include angiotensin blockers and diuretics. There are no compliance problems.  There is no history of angina or kidney disease. There is no history of chronic renal disease.     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 >60   Early death Father    Hypertension Brother    Congestive Heart Failure Brother    Breast cancer Maternal Grandmother        dx. >50      Current Outpatient Medications:    amLODipine (NORVASC) 5 MG tablet, Take 1 tablet (5 mg total) by mouth daily., Disp: 30 tablet, Rfl: 11   diphenhydrAMINE (BENADRYL) 25 MG tablet, Take 25 mg by mouth every 8 (eight) hours as needed for allergies., Disp: , Rfl:    ibuprofen (ADVIL) 200 MG tablet, Take 200 mg by mouth every 6 (six) hours as needed for headache or moderate pain., Disp: , Rfl:    loratadine (CLARITIN) 10 MG tablet, Take 10 mg by mouth daily as needed for allergies., Disp: , Rfl:    Multiple Vitamins-Minerals (MULTIVITAMIN WITH MINERALS) tablet, Take 1 tablet by mouth daily., Disp: , Rfl:    tamoxifen (NOLVADEX) 10 MG tablet, Take 0.5 tablets (5 mg total) by mouth daily., Disp: 30 tablet, Rfl: 1   traMADol (ULTRAM) 50 MG tablet, Take 1 tablet (50 mg total) by mouth every 6 (six) hours as needed for moderate pain or severe pain., Disp: 20 tablet, Rfl: 0   Turmeric 500 MG CAPS, Take 500 mg by mouth 3 (three) times a week., Disp: , Rfl:    valsartan-hydrochlorothiazide (DIOVAN-HCT) 320-25 MG tablet, Take 1 tablet by mouth daily., Disp: 90 tablet, Rfl: 2   Allergies  Allergen Reactions   Contrast Media [Iodinated Contrast Media] Hives     Review of Systems  Constitutional: Negative.  Negative for malaise/fatigue.  Eyes:  Negative for  blurred vision.  Cardiovascular: Negative.  Negative for chest pain and palpitations.  Gastrointestinal: Negative.   Skin: Negative.   Neurological: Negative.   Psychiatric/Behavioral: Negative.       Today's Vitals   03/17/23 1050 03/17/23 1117  BP: (!) 152/100 (!) 148/98  Pulse: 67   Temp: 98.1 F (36.7 C)   SpO2: 98%   Weight: 214 lb 9.6 oz (97.3 kg)   Height:  (1.651 m)    Body mass index is 35.71 kg/m.  Wt Readings from Last 3 Encounters:  03/17/23 214 lb 9.6 oz (97.3 kg)  03/17/23 214 lb (97.1 kg)  02/08/23 218 lb 6.4 oz (99.1 kg)    BP Readings from Last 3 Encounters:  03/17/23 (!) 148/98  03/17/23 (!) 148/98   02/08/23 (!) 182/86    Objective:  Physical Exam Vitals and nursing note reviewed.  Constitutional:      Appearance: Normal appearance. [name]  HENT:     Head: Normocephalic and atraumatic.     Nose:     Comments: Masked     Mouth/Throat:     Comments: Masked  Eyes:     Extraocular Movements: Extraocular movements intact.  Cardiovascular:     Rate and Rhythm: Normal rate and regular rhythm.     Heart sounds: Normal heart sounds.  Pulmonary:     Effort: Pulmonary effort is normal.     Breath sounds: Normal breath sounds.  Musculoskeletal:     Cervical back: Normal range of motion.  Skin:    General: Skin is warm.  Neurological:     General: No focal deficit present.     Mental Status: She is alert.  Psychiatric:        Mood and Affect: Mood normal.        Behavior: Behavior normal.         Assessment And Plan:     1. Essential hypertension Comments: Uncontrolled. Previous BP readings reviewed. She will c/w valsartan/hct 320/25. I will add amlodipine , 1/2 tab daily. F/u NV in 2 weeks. - CMP14+EGFR - Lipid panel - valsartan-hydrochlorothiazide (DIOVAN-HCT) 320-25 MG tablet; Take 1 tablet by mouth daily.  Dispense: 90 tablet; Refill: 2 - amLODipine (NORVASC) 5 MG tablet; Take 1 tablet (5 mg total) by mouth daily.  Dispense: 30 tablet; Refill: 11  2. Other abnormal glucose Comments: Previous labs reviewed, a1c has been elevated in the past.  She is encouraged to decrease intake of processed meats including bacon, sausages and deli meats. - Hemoglobin A1c  3. Ductal carcinoma in situ (DCIS) of right breast Comments: Oncology notes reviewed. She inquired about the need to f/u with Oncology since she declined tamoxifen, she is encouraged to keep appt.  4. Class 2 severe obesity due to excess calories with serious comorbidity and body mass index (BMI) of 35.0 to 35.9 in adult Comments: She is encouraged to strive for BMI less than 30 to decrease cardiac risk.  Advised to aim for at least 150 minutes of exercise per week.  5. Herpes zoster vaccination declined  Patient was given opportunity to ask questions. Patient verbalized understanding of the plan and was able to repeat key elements of the plan. All questions were answered to their satisfaction.   I, Gwynneth Aliment, MD, have reviewed all documentation for this visit. The documentation on 03/17/23 for the exam, diagnosis, procedures, and orders are all accurate and complete.   IF YOU HAVE BEEN REFERRED TO A SPECIALIST, IT MAY TAKE 1-2  WEEKS TO SCHEDULE/PROCESS THE REFERRAL. IF YOU HAVE NOT HEARD FROM US/SPECIALIST IN TWO WEEKS, PLEASE GIVE [place] A CALL AT [phone].   THE PATIENT IS ENCOURAGED TO PRACTICE SOCIAL DISTANCING DUE TO THE COVID-19 PANDEMIC.

## 2023-03-17 NOTE — Progress Notes (Signed)
Subjective:   Candice Ayala is a 67 y.o. female who presents for Medicare Annual (Subsequent) preventive examination.  Review of Systems     Cardiac Risk Factors include: advanced age (>55men, >66 women);obesity (BMI >30kg/m2)     Objective:    Today's Vitals   03/17/23 1059  BP: (!) 148/98  Pulse: 67  Temp: 98.1 F (36.7 C)  TempSrc: Oral  Weight: 214 lb (97.1 kg)  Height:  (1.651 m)   Body mass index is 35.61 kg/m.     03/17/2023   11:20 AM 12/31/2022    7:25 AM 12/03/2022    9:48 AM 12/01/2022    8:26 AM 03/11/2022    2:47 PM 02/26/2021   11:34 AM 10/22/2020    8:42 AM  Advanced Directives  Does Patient Have a Medical Advance Directive? No No No No No No No  Would patient like information on creating a medical advance directive?  Yes (ED - Information included in AVS) No - Patient declined No - Patient declined  No - Patient declined No - Patient declined    Current Medications (verified) Outpatient Encounter Medications as of 03/17/2023  Medication Sig   diphenhydrAMINE (BENADRYL) 25 MG tablet Take 25 mg by mouth every 8 (eight) hours as needed for allergies.   ibuprofen (ADVIL) 200 MG tablet Take 200 mg by mouth every 6 (six) hours as needed for headache or moderate pain.   loratadine (CLARITIN) 10 MG tablet Take 10 mg by mouth daily as needed for allergies.   Multiple Vitamins-Minerals (MULTIVITAMIN WITH MINERALS) tablet Take 1 tablet by mouth daily.   tamoxifen (NOLVADEX) 10 MG tablet Take 0.5 tablets (5 mg total) by mouth daily.   traMADol (ULTRAM) 50 MG tablet Take 1 tablet (50 mg total) by mouth every 6 (six) hours as needed for moderate pain or severe pain.   Turmeric 500 MG CAPS Take 500 mg by mouth 3 (three) times a week.   [DISCONTINUED] fluticasone (FLONASE) 50 MCG/ACT nasal spray SPRAY 2 SPRAYS INTO EACH NOSTRIL EVERY DAY (Patient taking differently: Place 2 sprays into both nostrils daily as needed for allergies.)   [DISCONTINUED]  valsartan-hydrochlorothiazide (DIOVAN-HCT) 320-25 MG tablet Take 1 tablet by mouth daily.   No facility-administered encounter medications on file as of 03/17/2023.    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   PONV (postoperative nausea and vomiting)    Pre-diabetes    Past Surgical History:  Procedure Laterality Date   BREAST BIOPSY Right 11/27/2013   Procedure: RIGHT BREAST BIOPSY WITH NEEDLE LOCALIZATION;  Surgeon: Adolph Pollack, MD;  Location: [facility];  Service: General;  Laterality: Right;   BREAST LUMPECTOMY WITH RADIOACTIVE SEED LOCALIZATION Right 12/03/2022   Procedure: RIGHT BREAST LUMPECTOMY WITH RADIOACTIVE SEED LOCALIZATION X2;  Surgeon: Abigail Miyamoto, MD;  Location: [facility] OR;  Service: General;  Laterality: Right;   COLONOSCOPY     JOINT REPLACEMENT  2004   right total hip   LYSIS OF ADHESION N/A 10/01/2020   Procedure: LYSIS OF ADHESION;  Surgeon: Adolphus Birchwood, MD;  Location: [facility];  Service: Gynecology;  Laterality: N/A;   OVARIAN CYST REMOVAL  1980   ROBOTIC ASSISTED TOTAL HYSTERECTOMY WITH BILATERAL SALPINGO OOPHERECTOMY N/A 10/01/2020   Procedure: XI ROBOTIC ASSISTED TOTAL HYSTERECTOMY WITH UNILATERL  SALPINGO OOPHORECTOMY;  Surgeon: Adolphus Birchwood, MD;  Location: [facility];  Service: Gynecology;  Laterality: N/A;   TOTAL HIP ARTHROPLASTY Right 2004   TOTAL HIP ARTHROPLASTY Left 02/12/2022   Family History  Problem Relation Age of Onset   Colon cancer Mother        dx. >43   Early death Father    Hypertension Brother    Congestive Heart Failure Brother    Breast cancer Maternal Grandmother        dx. >50   Social History   Socioeconomic History   Marital status: Divorced    Spouse name: Not on file   Number of children: 0   Years of education: Not on file    Highest education level: Not on file  Occupational History   Not on file  Tobacco Use   Smoking status: Never   Smokeless tobacco: Never   Tobacco comments:    She is a non-smoker  Vaping Use   Vaping Use: Never used  Substance and Sexual Activity   Alcohol use: No   Drug use: No   Sexual activity: Not Currently  Other Topics Concern   Not on file  Social History Narrative   Not on file   Social Determinants of Health   Financial Resource Strain: Low Risk  (03/17/2023)   Overall Financial Resource Strain (CARDIA)    Difficulty of Paying Living Expenses: Not hard at all  Food Insecurity: No Food Insecurity (03/17/2023)   Hunger Vital Sign    Worried About Running Out of Food in the Last Year: Never true    Ran Out of Food in the Last Year: Never true  Transportation Needs: No Transportation Needs (03/17/2023)   PRAPARE - Administrator, Civil Service (Medical): No    Lack of Transportation (Non-Medical): No  Physical Activity: Insufficiently Active (03/17/2023)   Exercise Vital Sign    Days of Exercise per Week: 3 days    Minutes of Exercise per Session: 20 min  Stress: No Stress Concern Present (03/17/2023)   [employer] of Occupational Health - Occupational Stress Questionnaire    Feeling of Stress : Only a little  Social Connections: Not on file    Tobacco Counseling Counseling given: Not Answered Tobacco comments: She is a non-smoker   Clinical Intake:  Pre-visit preparation completed: Yes  Pain : No/denies pain     Nutritional Status: BMI > 30  Obese Nutritional Risks: None Diabetes: No  How often do you need to have someone help you when you read instructions, pamphlets, or other written materials from your doctor or pharmacy?: 1 - Never  Diabetic? no  Interpreter Needed?: No  Information entered by :: NAllen LPN   Activities of Daily Living    03/17/2023   11:21 AM 12/01/2022    8:28 AM  In your present state of health, do you  have any difficulty performing the following activities:  Hearing? 0   Vision? 0   Difficulty concentrating or making decisions? 0   Walking or climbing stairs? 0   Dressing or bathing? 0   Doing errands, shopping? 0 0  Preparing Food and eating ? N   Using the Toilet? N   In the past six months, have you accidently leaked urine? N   Do you have problems with loss of bowel control? N   Managing your Medications? N   Managing your Finances? N   Housekeeping or managing your Housekeeping? N  Patient Care Team: Dorothyann Peng, MD as PCP - General (Internal Medicine) Donnelly Angelica, RN as Oncology Nurse Navigator Donnelly Angelica, RN as Oncology Nurse Navigator Abigail Miyamoto, MD as Consulting Physician (General Surgery) Malachy Mood, MD as Consulting Physician (Hematology) Dorothy Puffer, MD as Consulting Physician (Radiation Oncology)  Indicate any recent Medical Services you may have received from other than [facility] providers in the past year (date may be approximate).     Assessment:   This is a routine wellness examination for Rayleigh.  Hearing/Vision screen Vision Screening - Comments:: No regular eye exams  Dietary issues and exercise activities discussed: Current Exercise Habits: Home exercise routine, Type of exercise: walking, Time (Minutes): 20, Frequency (Times/Week): 3, Weekly Exercise (Minutes/Week): 60   Goals Addressed             This Visit's Progress    Patient Stated       03/17/2023, wants to get BP down and make diet adjustments       Depression Screen    03/17/2023   11:21 AM 03/17/2023   10:51 AM 08/12/2022   12:05 PM 03/11/2022    2:48 PM 03/05/2022   11:09 AM 02/26/2021   11:35 AM 01/24/2021    9:26 AM  PHQ 2/9 Scores  PHQ - 2 Score 0 0 0 0 0 0 0  PHQ- 9 Score  0         Fall Risk    03/17/2023   11:21 AM 03/17/2023   10:51 AM 08/12/2022   12:05 PM 03/11/2022    2:47 PM 03/05/2022   11:09 AM  Fall Risk   Falls in the past year? 0 0 0 0 0   Number falls in past yr: 0 0 0 0 0  Injury with Fall? 0 0 0 0 0  Risk for fall due to : Medication side effect No Fall Risks No Fall Risks Medication side effect No Fall Risks  Follow up Falls prevention discussed;Education provided;Falls evaluation completed Falls evaluation completed Falls evaluation completed Falls evaluation completed;Education provided;Falls prevention discussed Falls evaluation completed    FALL RISK PREVENTION PERTAINING TO THE HOME:  Any stairs in or around the home? No  If so, are there any without handrails? N/a Home free of loose throw rugs in walkways, pet beds, electrical cords, etc? Yes  Adequate lighting in your home to reduce risk of falls? Yes   ASSISTIVE DEVICES UTILIZED TO PREVENT FALLS:  Life alert? No  Use of a cane, walker or w/c? No  Grab bars in the bathroom? No  Shower chair or bench in shower? No  Elevated toilet seat or a handicapped toilet? Yes   TIMED UP AND GO:  Was the test performed? Yes .  Length of time to ambulate 10 feet: 5 sec.   Gait steady and fast without use of assistive device  Cognitive Function:        03/17/2023   11:22 AM 03/11/2022    2:49 PM 02/26/2021   11:36 AM 12/19/2019   10:28 AM  6CIT Screen  What Year? 0 points 0 points 0 points 0 points  What month? 0 points 0 points 0 points 0 points  What time? 0 points 0 points 0 points 0 points  Count back from 20 0 points 0 points 0 points 0 points  Months in reverse 0 points 0 points 0 points 0 points  Repeat phrase 0 points 0 points 0 points 0 points  Total Score 0 points 0  points 0 points 0 points    Immunizations Immunization History  Administered Date(s) Administered   Fluad Quad(high Dose 65+) 07/31/2020, 08/08/2022   Influenza,inj,Quad PF,6+ Mos 10/03/2018   Influenza-Unspecified 08/22/2019, 08/08/2022   PFIZER(Purple Top)SARS-COV-2 Vaccination 12/14/2019, 01/04/2020, 08/30/2020   PNEUMOCOCCAL CONJUGATE-20 08/22/2020, 08/22/2021   Pfizer Covid-19  Vaccine Bivalent Booster 5y-11y 08/22/2021   Pneumococcal Polysaccharide-23 08/22/2019   Tdap 01/05/2022    TDAP status: Up to date  Flu Vaccine status: Up to date  Pneumococcal vaccine status: Up to date  Covid-19 vaccine status: Completed vaccines  Qualifies for Shingles Vaccine? Yes   Zostavax completed No   Shingrix Completed?: No.    Education has been provided regarding the importance of this vaccine. Patient has been advised to call insurance company to determine out of pocket expense if they have not yet received this vaccine. Advised may also receive vaccine at local pharmacy or Health Dept. Verbalized acceptance and understanding.  Screening Tests Health Maintenance  Topic Date Due   COVID-19 Vaccine (5 - 2023-24 season) 07/24/2022   Medicare Annual Wellness (AWV)  03/12/2023   Zoster Vaccines- Shingrix (1 of 2) 06/16/2023 (Originally 02/17/1975)   INFLUENZA VACCINE  06/24/2023   MAMMOGRAM  10/29/2024   COLONOSCOPY (Pts 45-78yrs Insurance coverage will need to be confirmed)  09/04/2026   DTaP/Tdap/Td (2 - Td or Tdap) 01/06/2032   Pneumonia Vaccine 75+ Years old  Completed   DEXA SCAN  Completed   Hepatitis C Screening  Completed   HPV VACCINES  Aged Out    Health Maintenance  Health Maintenance Due  Topic Date Due   COVID-19 Vaccine (5 - 2023-24 season) 07/24/2022   Medicare Annual Wellness (AWV)  03/12/2023    Colorectal cancer screening: Type of screening: Colonoscopy. Completed 09/04/2016. Repeat every 10 years  Mammogram status: Completed 10/29/2022. Repeat every year  Bone Density status: Completed 03/29/2020.   Lung Cancer Screening: (Low Dose CT Chest recommended if Age 50-80 years, 30 pack-year currently smoking OR have quit w/in 15years.) does not qualify.   Lung Cancer Screening Referral: no  Additional Screening:  Hepatitis C Screening: does qualify; Completed 06/30/2016  Vision Screening: Recommended annual ophthalmology exams for early detection  of glaucoma and other disorders of the eye. Is the patient up to date with their annual eye exam?  No  Who is the provider or what is the name of the office in which the patient attends annual eye exams? none If pt is not established with a provider, would they like to be referred to a provider to establish care? No .   Dental Screening: Recommended annual dental exams for proper oral hygiene  Community Resource Referral / Chronic Care Management: CRR required this visit?  No   CCM required this visit?  No      Plan:     I have personally reviewed and noted the following in the patient's chart:   Medical and social history Use of alcohol, tobacco or illicit drugs  Current medications and supplements including opioid prescriptions. Patient is not currently taking opioid prescriptions. Functional ability and status Nutritional status Physical activity Advanced directives List of other physicians Hospitalizations, surgeries, and ER visits in previous 12 months Vitals Screenings to include cognitive, depression, and falls Referrals and appointments  In addition, I have reviewed and discussed with patient certain preventive protocols, quality metrics, and best practice recommendations. A written personalized care plan for preventive services as well as general preventive health recommendations were provided to patient.  Barb Merino, LPN   1/61/0960   Nurse Notes: none

## 2023-03-26 ENCOUNTER — Telehealth: Payer: Self-pay | Admitting: Hematology

## 2023-03-26 NOTE — Telephone Encounter (Signed)
patient called to verify time and date of appointment  

## 2023-03-30 ENCOUNTER — Ambulatory Visit: Payer: Medicare Other

## 2023-03-30 VITALS — BP 130/76 | HR 65 | Temp 98.5°F | Ht 65.0 in | Wt 214.0 lb

## 2023-03-30 DIAGNOSIS — I10 Essential (primary) hypertension: Secondary | ICD-10-CM

## 2023-03-30 NOTE — Progress Notes (Signed)
Patient presents today for a BP check, patient currently taking amLODipine 5mg  PM, valsartan-hctz 320-25mg  AM.  BP Readings from Last 3 Encounters:  03/30/23 130/84  03/17/23 (!) 148/98  03/17/23 (!) 148/98  Per provider- f/u next visit

## 2023-03-30 NOTE — Patient Instructions (Signed)
Hypertension, Adult ?Hypertension is another name for high blood pressure. High blood pressure forces your heart to work harder to pump blood. This can cause problems over time. ?There are two numbers in a blood pressure reading. There is a top number (systolic) over a bottom number (diastolic). It is best to have a blood pressure that is below 120/80. ?What are the causes? ?The cause of this condition is not known. Some other conditions can lead to high blood pressure. ?What increases the risk? ?Some lifestyle factors can make you more likely to develop high blood pressure: ?Smoking. ?Not getting enough exercise or physical activity. ?Being overweight. ?Having too much fat, sugar, calories, or salt (sodium) in your diet. ?Drinking too much alcohol. ?Other risk factors include: ?Having any of these conditions: ?Heart disease. ?Diabetes. ?High cholesterol. ?Kidney disease. ?Obstructive sleep apnea. ?Having a family history of high blood pressure and high cholesterol. ?Age. The risk increases with age. ?Stress. ?What are the signs or symptoms? ?High blood pressure may not cause symptoms. Very high blood pressure (hypertensive crisis) may cause: ?Headache. ?Fast or uneven heartbeats (palpitations). ?Shortness of breath. ?Nosebleed. ?Vomiting or feeling like you may vomit (nauseous). ?Changes in how you see. ?Very bad chest pain. ?Feeling dizzy. ?Seizures. ?How is this treated? ?This condition is treated by making healthy lifestyle changes, such as: ?Eating healthy foods. ?Exercising more. ?Drinking less alcohol. ?Your doctor may prescribe medicine if lifestyle changes do not help enough and if: ?Your top number is above 130. ?Your bottom number is above 80. ?Your personal target blood pressure may vary. ?Follow these instructions at home: ?Eating and drinking ? ?If told, follow the DASH eating plan. To follow this plan: ?Fill one half of your plate at each meal with fruits and vegetables. ?Fill one fourth of your plate  at each meal with whole grains. Whole grains include whole-wheat pasta, brown rice, and whole-grain bread. ?Eat or drink low-fat dairy products, such as skim milk or low-fat yogurt. ?Fill one fourth of your plate at each meal with low-fat (lean) proteins. Low-fat proteins include fish, chicken without skin, eggs, beans, and tofu. ?Avoid fatty meat, cured and processed meat, or chicken with skin. ?Avoid pre-made or processed food. ?Limit the amount of salt in your diet to less than 1,500 mg each day. ?Do not drink alcohol if: ?Your doctor tells you not to drink. ?You are pregnant, may be pregnant, or are planning to become pregnant. ?If you drink alcohol: ?Limit how much you have to: ?0-1 drink a day for women. ?0-2 drinks a day for men. ?Know how much alcohol is in your drink. In the [place] one drink equals one 12 oz bottle of beer (355 mL), one 5 oz glass of wine (148 mL), or one 1? oz glass of hard liquor (44 mL). ?Lifestyle ? ?Work with your doctor to stay at a healthy weight or to lose weight. Ask your doctor what the best weight is for you. ?Get at least 30 minutes of exercise that causes your heart to beat faster (aerobic exercise) most days of the week. This may include walking, swimming, or biking. ?Get at least 30 minutes of exercise that strengthens your muscles (resistance exercise) at least 3 days a week. This may include lifting weights or doing Pilates. ?Do not smoke or use any products that contain nicotine or tobacco. If you need help quitting, ask your doctor. ?Check your blood pressure at home as told by your doctor. ?Keep all follow-up visits. ?Medicines ?Take over-the-counter and prescription medicines   only as told by your doctor. Follow directions carefully. ?Do not skip doses of blood pressure medicine. The medicine does not work as well if you skip doses. Skipping doses also puts you at risk for problems. ?Ask your doctor about side effects or reactions to medicines that you should watch  for. ?Contact a doctor if: ?You think you are having a reaction to the medicine you are taking. ?You have headaches that keep coming back. ?You feel dizzy. ?You have swelling in your ankles. ?You have trouble with your vision. ?Get help right away if: ?You get a very bad headache. ?You start to feel mixed up (confused). ?You feel weak or numb. ?You feel faint. ?You have very bad pain in your: ?Chest. ?Belly (abdomen). ?You vomit more than once. ?You have trouble breathing. ?These symptoms may be an emergency. Get help right away. Call 911. ?Do not wait to see if the symptoms will go away. ?Do not drive yourself to the hospital. ?Summary ?Hypertension is another name for high blood pressure. ?High blood pressure forces your heart to work harder to pump blood. ?For most people, a normal blood pressure is less than 120/80. ?Making healthy choices can help lower blood pressure. If your blood pressure does not get lower with healthy choices, you may need to take medicine. ?This information is not intended to replace advice given to you by your health care provider. Make sure you discuss any questions you have with your health care provider. ?Document Revised: 08/28/2021 Document Reviewed: 08/28/2021 ?Elsevier Patient Education ? 2023 Elsevier Inc[employer] ? ?

## 2023-05-11 ENCOUNTER — Encounter: Payer: Self-pay | Admitting: Adult Health

## 2023-05-11 ENCOUNTER — Inpatient Hospital Stay: Payer: Medicare Other | Attending: Hematology | Admitting: Adult Health

## 2023-05-11 ENCOUNTER — Other Ambulatory Visit: Payer: Self-pay

## 2023-05-11 VITALS — BP 150/77 | HR 71 | Temp 98.5°F | Ht 65.35 in | Wt 214.0 lb

## 2023-05-11 DIAGNOSIS — Z8249 Family history of ischemic heart disease and other diseases of the circulatory system: Secondary | ICD-10-CM | POA: Insufficient documentation

## 2023-05-11 DIAGNOSIS — Z90710 Acquired absence of both cervix and uterus: Secondary | ICD-10-CM | POA: Diagnosis not present

## 2023-05-11 DIAGNOSIS — D0511 Intraductal carcinoma in situ of right breast: Secondary | ICD-10-CM | POA: Diagnosis present

## 2023-05-11 DIAGNOSIS — Z90721 Acquired absence of ovaries, unilateral: Secondary | ICD-10-CM | POA: Insufficient documentation

## 2023-05-11 DIAGNOSIS — Z803 Family history of malignant neoplasm of breast: Secondary | ICD-10-CM | POA: Diagnosis not present

## 2023-05-11 DIAGNOSIS — Z79899 Other long term (current) drug therapy: Secondary | ICD-10-CM | POA: Insufficient documentation

## 2023-05-11 DIAGNOSIS — Z800 Family history of malignant neoplasm of digestive organs: Secondary | ICD-10-CM | POA: Insufficient documentation

## 2023-05-11 DIAGNOSIS — Z1239 Encounter for other screening for malignant neoplasm of breast: Secondary | ICD-10-CM

## 2023-05-11 DIAGNOSIS — Z170 Estrogen receptor positive status [ER+]: Secondary | ICD-10-CM | POA: Insufficient documentation

## 2023-05-11 NOTE — Progress Notes (Signed)
SURVIVORSHIP VISIT:  BRIEF ONCOLOGIC HISTORY:  Oncology History  Ductal carcinoma in situ (DCIS) of right breast  11/09/2022 Initial Diagnosis   Ductal carcinoma in situ (DCIS) of right breast   12/03/2022 Pathology Results    FINAL MICROSCOPIC DIAGNOSIS:   A. BREAST, RIGHT W/SEEDS, LUMPECTOMY:  - Ductal carcinoma in situ, intermediate grade with calcifications,  approximately measuring 4 cm, see comment   - DCIS is less than 0.1 mm from the posterior margin (barbell clip)  - Biopsy site changes  - No evidence of invasive carcinoma  - See oncology table    01/11/2023 - 02/05/2023 Radiation Therapy   Site Technique Total Dose (Gy) Dose per Fx (Gy) Completed Fx Beam Energies  Breast, Right: Breast_R 3D 42.56/42.56 2.66 16/16 10X  Breast, Right: Breast_R_Bst 3D 8/8 2 4/4 6X       INTERVAL HISTORY:  Candice Ayala to review her survivorship care plan detailing her treatment course for breast cancer, as well as monitoring long-term side effects of that treatment, education regarding health maintenance, screening, and overall wellness and health promotion.     Overall, Candice Ayala reports feeling quite well.  She is healing well from her surgery and radiation.  She opted to forego taking antiestrogen therapy with tamoxifen.  REVIEW OF SYSTEMS:  Review of Systems  Constitutional:  Negative for appetite change, chills, fatigue, fever and unexpected weight change.  HENT:   Negative for hearing loss, lump/mass and trouble swallowing.   Eyes:  Negative for eye problems and icterus.  Respiratory:  Negative for chest tightness, cough and shortness of breath.   Cardiovascular:  Negative for chest pain, leg swelling and palpitations.  Gastrointestinal:  Negative for abdominal distention, abdominal pain, constipation, diarrhea, nausea and vomiting.  Endocrine: Negative for hot flashes.  Genitourinary:  Negative for difficulty urinating.   Musculoskeletal:  Negative for arthralgias.  Skin:   Negative for itching and rash.  Neurological:  Negative for dizziness, extremity weakness, headaches and numbness.  Hematological:  Negative for adenopathy. Does not bruise/bleed easily.  Psychiatric/Behavioral:  Negative for depression. The patient is not nervous/anxious.    Breast: Denies any new nodularity, masses, tenderness, nipple changes, or nipple discharge.       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   PONV (postoperative nausea and vomiting)    Pre-diabetes    Past Surgical History:  Procedure Laterality Date   BREAST BIOPSY Right 11/27/2013   Procedure: RIGHT BREAST BIOPSY WITH NEEDLE LOCALIZATION;  Surgeon: Adolph Pollack, MD;  Location: [facility];  Service: General;  Laterality: Right;   BREAST LUMPECTOMY WITH RADIOACTIVE SEED LOCALIZATION Right 12/03/2022   Procedure: RIGHT BREAST LUMPECTOMY WITH RADIOACTIVE SEED LOCALIZATION X2;  Surgeon: Abigail Miyamoto, MD;  Location[facility];  Service: General;  Laterality: Right;   COLONOSCOPY     JOINT REPLACEMENT  2004   right total hip   LYSIS OF ADHESION N/A 10/01/2020   Procedure: LYSIS OF ADHESION;  Surgeon: Adolphus Birchwood, MD;  Location: [facility];  Service: Gynecology;  Laterality: N/A;   OVARIAN CYST REMOVAL  1980   ROBOTIC ASSISTED TOTAL HYSTERECTOMY WITH BILATERAL SALPINGO OOPHERECTOMY N/A 10/01/2020   Procedure: XI ROBOTIC ASSISTED TOTAL HYSTERECTOMY WITH UNILATERL  SALPINGO OOPHORECTOMY;  Surgeon: Adolphus Birchwood, MD;  Location: [facility];  Service: Gynecology;  Laterality: N/A;  TOTAL HIP ARTHROPLASTY Right 2004   TOTAL HIP ARTHROPLASTY Left 02/12/2022     ALLERGIES:  Allergies  Allergen Reactions   Contrast Media [Iodinated Contrast Media] Hives     CURRENT MEDICATIONS:  Outpatient Encounter Medications as of 05/11/2023  Medication Sig    amLODipine (NORVASC) 5 MG tablet Take 1 tablet (5 mg total) by mouth daily.   diphenhydrAMINE (BENADRYL) 25 MG tablet Take 25 mg by mouth every 8 (eight) hours as needed for allergies.   ibuprofen (ADVIL) 200 MG tablet Take 200 mg by mouth every 6 (six) hours as needed for headache or moderate pain.   loratadine (CLARITIN) 10 MG tablet Take 10 mg by mouth daily as needed for allergies.   Multiple Vitamins-Minerals (MULTIVITAMIN WITH MINERALS) tablet Take 1 tablet by mouth daily.   valsartan-hydrochlorothiazide (DIOVAN-HCT) 320-25 MG tablet Take 1 tablet by mouth daily.   [DISCONTINUED] Turmeric 500 MG CAPS Take 500 mg by mouth 3 (three) times a week.   tamoxifen (NOLVADEX) 10 MG tablet Take 0.5 tablets (5 mg total) by mouth daily. (Patient not taking: Reported on 05/11/2023)   [DISCONTINUED] fluticasone (FLONASE) 50 MCG/ACT nasal spray SPRAY 2 SPRAYS INTO EACH NOSTRIL EVERY DAY (Patient taking differently: Place 2 sprays into both nostrils daily as needed for allergies.)   [DISCONTINUED] traMADol (ULTRAM) 50 MG tablet Take 1 tablet (50 mg total) by mouth every 6 (six) hours as needed for moderate pain or severe pain.   No facility-administered encounter medications on file as of 05/11/2023.     ONCOLOGIC FAMILY HISTORY:  Family History  Problem Relation Age of Onset   Colon cancer Mother        dx. >73   Early death Father    Hypertension Brother    Congestive Heart Failure Brother    Breast cancer Maternal Grandmother        dx. >50     SOCIAL HISTORY:  Social History   Socioeconomic History   Marital status: Divorced    Spouse name: Not on file   Number of children: 0   Years of education: Not on file   Highest education level: Not on file  Occupational History   Not on file  Tobacco Use   Smoking status: Never   Smokeless tobacco: Never   Tobacco comments:    She is a non-smoker  Vaping Use   Vaping Use: Never used  Substance and Sexual Activity   Alcohol use: No    Drug use: No   Sexual activity: Not Currently  Other Topics Concern   Not on file  Social History Narrative   Not on file   Social Determinants of Health   Financial Resource Strain: Low Risk  (03/17/2023)   Overall Financial Resource Strain (CARDIA)    Difficulty of Paying Living Expenses: Not hard at all  Food Insecurity: No Food Insecurity (03/17/2023)   Hunger Vital Sign    Worried About Running Out of Food in the Last Year: Never true    Ran Out of Food in the Last Year: Never true  Transportation Needs: No Transportation Needs (03/17/2023)   PRAPARE - Administrator, Civil Service (Medical): No    Lack of Transportation (Non-Medical): No  Physical Activity: Insufficiently Active (03/17/2023)   Exercise Vital Sign    Days of Exercise per Week: 3 days    Minutes of Exercise per Session: 20 min  Stress: No Stress Concern Present (03/17/2023)   [employer] of Occupational Health - Occupational  Stress Questionnaire    Feeling of Stress : Only a little  Social Connections: Not on file  Intimate Partner Violence: Not At Risk (12/31/2022)   Humiliation, Afraid, Rape, and Kick questionnaire    Fear of Current or Ex-Partner: No    Emotionally Abused: No    Physically Abused: No    Sexually Abused: No     OBSERVATIONS/OBJECTIVE:  BP (!) 150/77 (BP Location: Left Arm, Patient Position: Sitting)   Pulse 71   Temp 98.5 F (36.9 C) (Oral)   Ht 5' 5.35" (1.66 m)   Wt 214 lb (97.1 kg)   SpO2 100%   BMI 35.23 kg/m  GENERAL: Patient is a well appearing female in no acute distress HEENT:  Sclerae anicteric.  Oropharynx clear and moist. No ulcerations or evidence of oropharyngeal candidiasis. Neck is supple.  NODES:  No cervical, supraclavicular, or axillary lymphadenopathy palpated.  BREAST EXAM: Right breast status postlumpectomy and radiation no sign of local recurrence left breast is benign. LUNGS:  Clear to auscultation bilaterally.  No wheezes or rhonchi. HEART:   Regular rate and rhythm. No murmur appreciated. ABDOMEN:  Soft, nontender.  Positive, normoactive bowel sounds. No organomegaly palpated. MSK:  No focal spinal tenderness to palpation. Full range of motion bilaterally in the upper extremities. EXTREMITIES:  No peripheral edema.   SKIN:  Clear with no obvious rashes or skin changes. No nail dyscrasia. NEURO:  Nonfocal. Well oriented.  Appropriate affect.   LABORATORY DATA:  None for this visit.  DIAGNOSTIC IMAGING:  None for this visit.      ASSESSMENT AND PLAN:  Ms.. Ayala is a pleasant 67 y.o. female with Stage 0 right breast DCIS, ER+/PR+, diagnosed in 10/2022, treated with lumpectomy, adjuvant radiation therapy.  She presents to the Survivorship Clinic for our initial meeting and routine follow-up post-completion of treatment for breast cancer.    1. Stage 0 righ breast cancer:  Ms. Nugen is continuing to recover from definitive treatment for breast cancer. She will follow-up with her medical oncologist, Dr.  Mosetta Putt in 6 months with history and physical exam per surveillance protocol.  She has opted to forego tamoxifen therapy and know that this may increase her risk of recurrence.  Her mammogram is due October 2024; orders placed today.   We discussed intensified screening and I placed an order for Breast MRI to occur in 03/2024.   Today, a comprehensive survivorship care plan and treatment summary was reviewed with the patient today detailing her breast cancer diagnosis, treatment course, potential late/long-term effects of treatment, appropriate follow-up care with recommendations for the future, and patient education resources.  A copy of this summary, along with a letter will be sent to the patient's primary care provider via mail/fax/In Basket message after today's visit.    2. Bone health:   She was given education on specific activities to promote bone health.  3. Cancer screening:  Due to Ms. Ivanov's history and her age, she  should receive screening for skin cancers, colon cancer.  The information and recommendations are listed on the patient's comprehensive care plan/treatment summary and were reviewed in detail with the patient.    4. Health maintenance and wellness promotion: Ms. Iandoli was encouraged to consume 5-7 servings of fruits and vegetables per day. We reviewed the "Nutrition Rainbow" handout.  She was also encouraged to engage in moderate to vigorous exercise for 30 minutes per day most days of the week.  She was instructed to limit her alcohol consumption and  continue to abstain from tobacco use.     5. Support services/counseling: It is not uncommon for this period of the patient's cancer care trajectory to be one of many emotions and stressors.   She was given information regarding our available services and encouraged to contact me with any questions or for help enrolling in any of our support group/programs.    Follow up instructions:    -Return to cancer center in 6 months for f/u with Dr. Mosetta Putt  -Mammogram due in 09/2023 -She is welcome to return back to the Survivorship Clinic at any time; no additional follow-up needed at this time.  -Consider referral back to survivorship as a long-term survivor for continued surveillance  The patient was provided an opportunity to ask questions and all were answered. The patient agreed with the plan and demonstrated an understanding of the instructions.   Total encounter time:40 minutes*in face-to-face visit time, chart review, lab review, care coordination, order entry, and documentation of the encounter time.    Lillard Anes, NP 05/11/23 3:09 PM Medical Oncology and Hematology [facility] [address] [place], [place] [address] Tel. [phone]    Fax. [phone]  *Total Encounter Time as defined by the Centers for Medicare and Medicaid Services includes, in addition to the face-to-face time of a patient visit (documented in the  note above) non-face-to-face time: obtaining and reviewing outside history, ordering and reviewing medications, tests or procedures, care coordination (communications with other health care professionals or caregivers) and documentation in the medical record.

## 2023-05-14 ENCOUNTER — Telehealth: Payer: Self-pay | Admitting: Adult Health

## 2023-05-14 NOTE — Telephone Encounter (Signed)
Rescheduled appointment per 6/18 los. Left voicemail.

## 2023-06-23 ENCOUNTER — Ambulatory Visit: Payer: Medicare Other | Admitting: Internal Medicine

## 2023-07-07 ENCOUNTER — Ambulatory Visit (INDEPENDENT_AMBULATORY_CARE_PROVIDER_SITE_OTHER): Payer: Medicare Other | Admitting: Internal Medicine

## 2023-07-07 ENCOUNTER — Encounter: Payer: Self-pay | Admitting: Internal Medicine

## 2023-07-07 VITALS — BP 130/82 | HR 76 | Temp 98.5°F | Ht 65.0 in | Wt 211.2 lb

## 2023-07-07 DIAGNOSIS — E042 Nontoxic multinodular goiter: Secondary | ICD-10-CM | POA: Diagnosis not present

## 2023-07-07 DIAGNOSIS — R7309 Other abnormal glucose: Secondary | ICD-10-CM | POA: Diagnosis not present

## 2023-07-07 DIAGNOSIS — Z6835 Body mass index (BMI) 35.0-35.9, adult: Secondary | ICD-10-CM

## 2023-07-07 DIAGNOSIS — Z85850 Personal history of malignant neoplasm of thyroid: Secondary | ICD-10-CM

## 2023-07-07 DIAGNOSIS — E2839 Other primary ovarian failure: Secondary | ICD-10-CM | POA: Insufficient documentation

## 2023-07-07 DIAGNOSIS — Z853 Personal history of malignant neoplasm of breast: Secondary | ICD-10-CM

## 2023-07-07 DIAGNOSIS — D0511 Intraductal carcinoma in situ of right breast: Secondary | ICD-10-CM

## 2023-07-07 DIAGNOSIS — I10 Essential (primary) hypertension: Secondary | ICD-10-CM

## 2023-07-07 NOTE — Progress Notes (Signed)
I,Candice Ayala, CMA,acting as a Neurosurgeon for Gwynneth Aliment, MD.,have documented all relevant documentation on the behalf of Gwynneth Aliment, MD,as directed by  Gwynneth Aliment, MD while in the presence of Gwynneth Aliment, MD.  Subjective:  Patient ID: Candice Ayala , female    DOB: 02-16-1956 , 67 y.o.   MRN: [id number]  Chief Complaint  Patient presents with   Hypertension    HPI  [name] She reports compliance with medications.  Denies headache, chest pain, and SOB. She denies having any specific questions or concerns.     Hypertension This is a chronic problem. The current episode started more than 1 year ago. The problem is unchanged. The problem is controlled. Pertinent negatives include no anxiety, blurred vision, chest pain, malaise/fatigue, palpitations or peripheral edema. There are no associated agents to hypertension. Risk factors for coronary artery disease include obesity and sedentary lifestyle. Past treatments include angiotensin blockers and diuretics. There are no compliance problems.  There is no history of angina or kidney disease. There is no history of chronic renal disease.     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 >4   Early death Father    Hypertension Brother    Congestive Heart Failure Brother    Breast cancer Maternal Grandmother        dx. >50     Current Outpatient Medications:    amLODipine (NORVASC) 5 MG tablet, Take 1 tablet (5 mg total) by mouth daily., Disp: 30 tablet, Rfl: 11   diphenhydrAMINE (BENADRYL) 25 MG tablet, Take 25 mg by mouth every 8 (eight) hours as needed for allergies., Disp: , Rfl:    ibuprofen (ADVIL) 200 MG tablet, Take 200 mg  by mouth every 6 (six) hours as needed for headache or moderate pain., Disp: , Rfl:    loratadine (CLARITIN) 10 MG tablet, Take 10 mg by mouth daily as needed for allergies., Disp: , Rfl:    Multiple Vitamins-Minerals (MULTIVITAMIN WITH MINERALS) tablet, Take 1 tablet by mouth daily., Disp: , Rfl:    valsartan-hydrochlorothiazide (DIOVAN-HCT) 320-25 MG tablet, Take 1 tablet by mouth daily., Disp: 90 tablet, Rfl: 2   Allergies  Allergen Reactions   Contrast Media [Iodinated Contrast Media] Hives     Review of Systems  Constitutional: Negative.  Negative for malaise/fatigue.  Eyes:  Negative for blurred vision.  Respiratory: Negative.    Cardiovascular: Negative.  Negative for chest pain and palpitations.  Gastrointestinal: Negative.   Musculoskeletal: Negative.   Neurological: Negative.   Psychiatric/Behavioral: Negative.       Today's Vitals   07/07/23 1534  BP: 130/82  Pulse: 76  Temp: 98.5 F (36.9 C)  SpO2: 98%  Weight: 211 lb 3.2 oz (95.8 kg)  Height: 5\' 5"  (1.651 m)   Body mass index is 35.15 kg/m.  Wt Readings from Last 3 Encounters:  07/07/23 211 lb 3.2 oz (95.8 kg)  05/11/23 214 lb (97.1 kg)  03/30/23 214 lb (97.1 kg)    BP Readings from Last 3 Encounters:  07/07/23 130/82  05/11/23 (!) 150/77  03/30/23 130/76  Objective:  Physical Exam Vitals and nursing note reviewed.  Constitutional:      Appearance: Normal appearance. [name]  HENT:     Head: Normocephalic and atraumatic.  Eyes:     Extraocular Movements: Extraocular movements intact.  Cardiovascular:     Rate and Rhythm: Normal rate and regular rhythm.     Heart sounds: Normal heart sounds.  Pulmonary:     Effort: Pulmonary effort is normal.     Breath sounds: Normal breath sounds.  Musculoskeletal:     Cervical back: Normal range of motion.  Skin:    General: Skin is warm.  Neurological:     General: No focal deficit present.     Mental Status: She is alert.  Psychiatric:         Mood and Affect: Mood normal.        Behavior: Behavior normal.         Assessment And Plan:  Essential hypertension Assessment & Plan: Chronic, fair control. Goal BP<120/80.   She will continue with amlodipine 5mg  and valsartan/hct 320/25mg  daily for now. She is encouraged to decrease her sodium intake. She agrees to rto on 07/27/23 for a NV. I will recheck her BP at that time. If needed, will increase amlodipine to 10mg .  Importance of regular exercise was discussed with the patient as well.   Orders: -     BMP8+EGFR; Future  Other abnormal glucose Assessment & Plan: Previous labs reviewed, her A1c has been elevated in the past. I will check an A1c at her NV on 07/27/23. Reminded to avoid refined sugars including sugary drinks/foods and processed meats including bacon, sausages and deli meats.    Orders: -     Hemoglobin A1c; Future  Estrogen deficiency Assessment & Plan: Last bone density was in 2021 at [facility]. I will refer her for a repeat study. She is encouraged to engage in weight-bearing exercises at least two to three times weekly.   Orders: -     DG Bone Density; Future  Multinodular goiter Assessment & Plan: Most recent thyroid u/s reviewed. She did have bx of one nodule, this was neg for malignancy. I will check labs as below.   Orders: -     TSH + free T4; Future -     Thyroglobulin antibody; Future  Ductal carcinoma in situ (DCIS) of right breast Assessment & Plan: Initially diagnosed in 2023. She is s/p lumpectomy with XRT. Most recently Oncology note reviewed in detail. She does not wish to pursue antiestrogen therapy. She is currently under active surveillance.    Class 2 severe obesity due to excess calories with serious comorbidity and body mass index (BMI) of 35.0 to 35.9 in adult [facility]) Assessment & Plan: She was commended on her 3lb weight loss since June 2024.  She is encouraged to aim for at least 150 minutes of exercise/week, while striving for BMI<30  to decrease cardiac risk.    History of thyroid cancer Assessment & Plan: This was found within an ovarian cyst.  She underwent a robotic assisted total hysterectomy with BSO in 2021. Intraoperative findings were significant for a 12 cm complex left ovarian cyst.  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  The papillary thyroid carcinoma component measured 1.8 cm in greatest dimension.  There was no ovarian surface involvement identified. Her tumor was determined to be low risk based on classic papillary architecture, no extra-ovarian extension  and tumor <4cm.  I will check  TSH, free t4 and thyroglobulin antibody. Referred by Oncology to Endo for further management, she has upcoming appt in October 2024.    She is encouraged to strive for BMI less than 30 to decrease cardiac risk. Advised to aim for at least 150 minutes of exercise per week.    Return in 20 days (on 07/27/2023), or NV - bp check and lab visit, for 4 month bpc.  Patient was given opportunity to ask questions. Patient verbalized understanding of the plan and was able to repeat key elements of the plan. All questions were answered to their satisfaction.   I, Gwynneth Aliment, MD, have reviewed all documentation for this visit. The documentation on 07/07/23 for the exam, diagnosis, procedures, and orders are all accurate and complete.   IF YOU HAVE BEEN REFERRED TO A SPECIALIST, IT MAY TAKE 1-2 WEEKS TO SCHEDULE/PROCESS THE REFERRAL. IF YOU HAVE NOT HEARD FROM US/SPECIALIST IN TWO WEEKS, PLEASE GIVE [place] A CALL AT [phone].   THE PATIENT IS ENCOURAGED TO PRACTICE SOCIAL DISTANCING DUE TO THE COVID-19 PANDEMIC.

## 2023-07-07 NOTE — Patient Instructions (Signed)
Hypertension, Adult Hypertension is another name for high blood pressure. High blood pressure forces your heart to work harder to pump blood. This can cause problems over time. There are two numbers in a blood pressure reading. There is a top number (systolic) over a bottom number (diastolic). It is best to have a blood pressure that is below 120/80. What are the causes? The cause of this condition is not known. Some other conditions can lead to high blood pressure. What increases the risk? Some lifestyle factors can make you more likely to develop high blood pressure: Smoking. Not getting enough exercise or physical activity. Being overweight. Having too much fat, sugar, calories, or salt (sodium) in your diet. Drinking too much alcohol. Other risk factors include: Having any of these conditions: Heart disease. Diabetes. High cholesterol. Kidney disease. Obstructive sleep apnea. Having a family history of high blood pressure and high cholesterol. Age. The risk increases with age. Stress. What are the signs or symptoms? High blood pressure may not cause symptoms. Very high blood pressure (hypertensive crisis) may cause: Headache. Fast or uneven heartbeats (palpitations). Shortness of breath. Nosebleed. Vomiting or feeling like you may vomit (nauseous). Changes in how you see. Very bad chest pain. Feeling dizzy. Seizures. How is this treated? This condition is treated by making healthy lifestyle changes, such as: Eating healthy foods. Exercising more. Drinking less alcohol. Your doctor may prescribe medicine if lifestyle changes do not help enough and if: Your top number is above 130. Your bottom number is above 80. Your personal target blood pressure may vary. Follow these instructions at home: Eating and drinking  If told, follow the DASH eating plan. To follow this plan: Fill one half of your plate at each meal with fruits and vegetables. Fill one fourth of your plate  at each meal with whole grains. Whole grains include whole-wheat pasta, brown rice, and whole-grain bread. Eat or drink low-fat dairy products, such as skim milk or low-fat yogurt. Fill one fourth of your plate at each meal with low-fat (lean) proteins. Low-fat proteins include fish, chicken without skin, eggs, beans, and tofu. Avoid fatty meat, cured and processed meat, or chicken with skin. Avoid pre-made or processed food. Limit the amount of salt in your diet to less than 1,500 mg each day. Do not drink alcohol if: Your doctor tells you not to drink. You are pregnant, may be pregnant, or are planning to become pregnant. If you drink alcohol: Limit how much you have to: 0-1 drink a day for women. 0-2 drinks a day for men. Know how much alcohol is in your drink. In the [place] one drink equals one 12 oz bottle of beer (355 mL), one 5 oz glass of wine (148 mL), or one 1 oz glass of hard liquor (44 mL). Lifestyle  Work with your doctor to stay at a healthy weight or to lose weight. Ask your doctor what the best weight is for you. Get at least 30 minutes of exercise that causes your heart to beat faster (aerobic exercise) most days of the week. This may include walking, swimming, or biking. Get at least 30 minutes of exercise that strengthens your muscles (resistance exercise) at least 3 days a week. This may include lifting weights or doing Pilates. Do not smoke or use any products that contain nicotine or tobacco. If you need help quitting, ask your doctor. Check your blood pressure at home as told by your doctor. Keep all follow-up visits. Medicines Take over-the-counter and prescription medicines   only as told by your doctor. Follow directions carefully. Do not skip doses of blood pressure medicine. The medicine does not work as well if you skip doses. Skipping doses also puts you at risk for problems. Ask your doctor about side effects or reactions to medicines that you should watch  for. Contact a doctor if: You think you are having a reaction to the medicine you are taking. You have headaches that keep coming back. You feel dizzy. You have swelling in your ankles. You have trouble with your vision. Get help right away if: You get a very bad headache. You start to feel mixed up (confused). You feel weak or numb. You feel faint. You have very bad pain in your: Chest. Belly (abdomen). You vomit more than once. You have trouble breathing. These symptoms may be an emergency. Get help right away. Call 911. Do not wait to see if the symptoms will go away. Do not drive yourself to the hospital. Summary Hypertension is another name for high blood pressure. High blood pressure forces your heart to work harder to pump blood. For most people, a normal blood pressure is less than 120/80. Making healthy choices can help lower blood pressure. If your blood pressure does not get lower with healthy choices, you may need to take medicine. This information is not intended to replace advice given to you by your health care provider. Make sure you discuss any questions you have with your health care provider. Document Revised: 08/28/2021 Document Reviewed: 08/28/2021 Elsevier Patient Education  2024 Elsevier Inc.  

## 2023-07-17 DIAGNOSIS — Z85850 Personal history of malignant neoplasm of thyroid: Secondary | ICD-10-CM | POA: Insufficient documentation

## 2023-07-17 DIAGNOSIS — E042 Nontoxic multinodular goiter: Secondary | ICD-10-CM | POA: Insufficient documentation

## 2023-07-17 NOTE — Assessment & Plan Note (Addendum)
Initially diagnosed in 2023. She is s/p lumpectomy with XRT. Most recently Oncology note reviewed in detail. She does not wish to pursue antiestrogen therapy. She is currently under active surveillance.

## 2023-07-17 NOTE — Assessment & Plan Note (Signed)
Previous labs reviewed, her A1c has been elevated in the past. I will check an A1c at her NV on 07/27/23. Reminded to avoid refined sugars including sugary drinks/foods and processed meats including bacon, sausages and deli meats.

## 2023-07-17 NOTE — Assessment & Plan Note (Signed)
Most recent thyroid u/s reviewed. She did have bx of one nodule, this was neg for malignancy. I will check labs as below.

## 2023-07-17 NOTE — Assessment & Plan Note (Signed)
Last bone density was in 2021 at [facility]. I will refer her for a repeat study. She is encouraged to engage in weight-bearing exercises at least two to three times weekly.

## 2023-07-17 NOTE — Assessment & Plan Note (Addendum)
Chronic, fair control. Goal BP<120/80.   She will continue with amlodipine 5mg  and valsartan/hct 320/25mg  daily for now. She is encouraged to decrease her sodium intake. She agrees to rto on 07/27/23 for a NV. I will recheck her BP at that time. If needed, will increase amlodipine to 10mg .  Importance of regular exercise was discussed with the patient as well.

## 2023-07-17 NOTE — Assessment & Plan Note (Signed)
This was found within an ovarian cyst.  She underwent a robotic assisted total hysterectomy with BSO in 2021. Intraoperative findings were significant for a 12 cm complex left ovarian cyst.  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  The papillary thyroid carcinoma component measured 1.8 cm in greatest dimension.  There was no ovarian surface involvement identified. Her tumor was determined to be low risk based on classic papillary architecture, no extra-ovarian extension and tumor <4cm.  I will check  TSH, free t4 and thyroglobulin antibody. Referred by Oncology to Endo for further management, she has upcoming appt in October 2024.

## 2023-07-17 NOTE — Assessment & Plan Note (Signed)
She was commended on her 3lb weight loss since June 2024.  She is encouraged to aim for at least 150 minutes of exercise/week, while striving for BMI<30 to decrease cardiac risk.

## 2023-07-27 ENCOUNTER — Ambulatory Visit: Payer: Medicare Other

## 2023-07-27 ENCOUNTER — Other Ambulatory Visit: Payer: Medicare Other

## 2023-07-27 VITALS — BP 130/70 | HR 75 | Temp 98.1°F | Ht 65.0 in | Wt 211.0 lb

## 2023-07-27 DIAGNOSIS — I10 Essential (primary) hypertension: Secondary | ICD-10-CM

## 2023-07-27 DIAGNOSIS — E042 Nontoxic multinodular goiter: Secondary | ICD-10-CM

## 2023-07-27 DIAGNOSIS — R7309 Other abnormal glucose: Secondary | ICD-10-CM

## 2023-07-27 NOTE — Progress Notes (Signed)
Patient presents today for a nurse visit bp. Patient is taking amlodipine 5mg  in the evenings and valsartan hydrochlorothiazide 320-25mg  in the mornings. I checked patient's bp was 130/70 p75. Patient was advised to continue with current meds and keep her next appt as scheduled.     BP Readings from Last 3 Encounters:  07/27/23 130/70  07/07/23 130/82  05/11/23 (!) 150/77

## 2023-07-28 LAB — BMP8+EGFR
BUN/Creatinine Ratio: 18 (ref 12–28)
BUN: 13 mg/dL (ref 8–27)
CO2: 22 mmol/L (ref 20–29)
Calcium: 9.9 mg/dL (ref 8.7–10.3)
Chloride: 103 mmol/L (ref 96–106)
Creatinine, Ser: 0.71 mg/dL (ref 0.57–1.00)
Glucose: 108 mg/dL — ABNORMAL HIGH (ref 70–99)
Potassium: 4.1 mmol/L (ref 3.5–5.2)
Sodium: 140 mmol/L (ref 134–144)
eGFR: 92 mL/min/{1.73_m2} (ref >59)

## 2023-07-28 LAB — HEMOGLOBIN A1C
Est. average glucose Bld gHb Est-mCnc: 137 mg/dL
Hgb A1c MFr Bld: 6.4 % — ABNORMAL HIGH (ref 4.8–5.6)

## 2023-07-28 LAB — TSH+FREE T4
Free T4: 1.09 ng/dL (ref 0.82–1.77)
TSH: 1.31 u[IU]/mL (ref 0.450–4.500)

## 2023-07-28 LAB — THYROGLOBULIN ANTIBODY: Thyroglobulin Antibody: <1 [IU]/mL (ref 0.0–0.9)

## 2023-08-05 ENCOUNTER — Ambulatory Visit: Payer: Medicare Other | Admitting: Hematology

## 2023-08-05 ENCOUNTER — Other Ambulatory Visit: Payer: Medicare Other

## 2023-08-13 NOTE — Radiation Completion Notes (Signed)
Patient Name: Candice Ayala, Candice Ayala MRN: [id number] Date of Birth: [birth year] Referring Physician: Dorothyann Peng, M.D. Date of Service: 2023-08-13 Radiation Oncologist: Dorothy Puffer, M.D. [facility]                             RADIATION ONCOLOGY END OF TREATMENT NOTE     Diagnosis: D05.11 Intraductal carcinoma in situ of right breast Staging on 2023-05-11: Ductal carcinoma in situ (DCIS) of right breast T=pTis (DCIS), N=pN0, M=cM0 Intent: Curative     ==========DELIVERED PLANS==========  First Treatment Date: 2023-01-11 - Last Treatment Date: 2023-02-05   Plan Name: Breast_R Site: Breast, Right Technique: 3D Mode: Photon Dose Per Fraction: 2.66 Gy Prescribed Dose (Delivered / Prescribed): 42.56 Gy / 42.56 Gy Prescribed Fxs (Delivered / Prescribed): 16 / 16   Plan Name: Breast_R_Bst Site: Breast, Right Technique: 3D Mode: Photon Dose Per Fraction: 2 Gy Prescribed Dose (Delivered / Prescribed): 8 Gy / 8 Gy Prescribed Fxs (Delivered / Prescribed): 4 / 4     ==========ON TREATMENT VISIT DATES========== 2023-01-15, 2023-01-22, 2023-01-29, 2023-02-05     ==========UPCOMING VISITS==========       ==========APPENDIX - ON TREATMENT VISIT NOTES==========   See weekly On Treatment Notes in Epic for details.

## 2023-08-16 LAB — HM DEXA SCAN

## 2023-08-19 ENCOUNTER — Encounter: Payer: Self-pay | Admitting: Internal Medicine

## 2023-09-03 ENCOUNTER — Encounter: Payer: Self-pay | Admitting: Internal Medicine

## 2023-09-03 ENCOUNTER — Ambulatory Visit (INDEPENDENT_AMBULATORY_CARE_PROVIDER_SITE_OTHER): Payer: Medicare Other | Admitting: Internal Medicine

## 2023-09-03 VITALS — BP 162/90 | HR 74 | Ht 65.0 in | Wt 213.2 lb

## 2023-09-03 DIAGNOSIS — E042 Nontoxic multinodular goiter: Secondary | ICD-10-CM | POA: Diagnosis not present

## 2023-09-03 DIAGNOSIS — C562 Malignant neoplasm of left ovary: Secondary | ICD-10-CM

## 2023-09-03 DIAGNOSIS — Z85850 Personal history of malignant neoplasm of thyroid: Secondary | ICD-10-CM | POA: Diagnosis not present

## 2023-09-03 LAB — TSH: TSH: 1.21 u[IU]/mL (ref 0.35–5.50)

## 2023-09-03 LAB — T4, FREE: Free T4: 0.76 ng/dL (ref 0.60–1.60)

## 2023-09-03 LAB — T3, FREE: T3, Free: 3.4 pg/mL (ref 2.3–4.2)

## 2023-09-03 NOTE — Progress Notes (Signed)
Name: Candice Ayala  MRN/ DOB: [id number], 1956/05/06    Age/ Sex: 67 y.o., female    PCP: Dorothyann Peng, MD   Reason for Endocrinology Evaluation:      Date of Initial Endocrinology Evaluation: 09/03/2023     HPI: Candice Ayala is a 67 y.o. 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 The patient presented for initial endocrinology clinic visit on 09/03/2023 for consultative assistance with her history of PTC.   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  Her tumor was determined to be low risk based on classic papillary architecture, no extraovarian extension and tumor <4 cm.  BRAF testing was negative    Thyroid ultrasound 10/2020 revealed a right mid 3.5 cm nodule meeting FNA criteria as well as subcentimeter nodules on the left. She is s/p FNA of the right mid nodule 11/06/2020 with scant cellularity, but it was read as benign.    She was diagnosed with right breast DCIS in 2023, treated with lumpectomy and adjuvant radiation therapy.    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   PONV (postoperative nausea and vomiting)    Pre-diabetes    Past Surgical History:  Past Surgical History:  Procedure Laterality Date   BREAST BIOPSY Right 11/27/2013   Procedure: RIGHT BREAST BIOPSY WITH NEEDLE LOCALIZATION;  Surgeon: Adolph Pollack, MD;  Location: [facility]  [place];  Service: General;  Laterality: Right;   BREAST LUMPECTOMY WITH RADIOACTIVE SEED LOCALIZATION Right 12/03/2022   Procedure: RIGHT BREAST LUMPECTOMY WITH RADIOACTIVE SEED LOCALIZATION X2;  Surgeon: Abigail Miyamoto, MD;  Location: [facility] OR;  Service: General;  Laterality: Right;   COLONOSCOPY     JOINT REPLACEMENT  2004   right total hip   LYSIS OF ADHESION N/A 10/01/2020   Procedure: LYSIS OF ADHESION;  Surgeon: Adolphus Birchwood, MD;  Location: [facility];  Service: Gynecology;  Laterality: N/A;   OVARIAN CYST REMOVAL  1980   ROBOTIC ASSISTED TOTAL HYSTERECTOMY WITH BILATERAL SALPINGO OOPHERECTOMY N/A 10/01/2020   Procedure: XI ROBOTIC ASSISTED TOTAL HYSTERECTOMY WITH UNILATERL  SALPINGO OOPHORECTOMY;  Surgeon: Adolphus Birchwood, MD;  Location: [facility];  Service: Gynecology;  Laterality: N/A;   TOTAL HIP ARTHROPLASTY Right 2004   TOTAL HIP ARTHROPLASTY Left 02/12/2022    Social History:  reports that she has never smoked. She has never used smokeless tobacco. She reports that she does not drink alcohol and does not use drugs. Family History: family history includes Breast cancer in her maternal grandmother; Colon cancer in her mother; Congestive Heart Failure in her brother; Early death in her father; Hypertension in her brother.   HOME MEDICATIONS: Allergies as of 09/03/2023       Reactions   Contrast Media [iodinated Contrast Media] Hives        Medication List  Accurate as of September 03, 2023  2:22 PM. If you have any questions, ask your nurse or doctor.          amLODipine 5 MG tablet Commonly known as: NORVASC Take 1 tablet (5 mg total) by mouth daily.   diphenhydrAMINE 25 MG tablet Commonly known as: BENADRYL Take 25 mg by mouth every 8 (eight) hours as needed for allergies.   ibuprofen 200 MG tablet Commonly known as: ADVIL Take 200 mg by mouth every 6 (six) hours as needed for headache or moderate pain.   loratadine 10 MG tablet Commonly known as:  CLARITIN Take 10 mg by mouth daily as needed for allergies.   multivitamin with minerals tablet Take 1 tablet by mouth daily.   valsartan-hydrochlorothiazide 320-25 MG tablet Commonly known as: DIOVAN-HCT Take 1 tablet by mouth daily.          REVIEW OF SYSTEMS: A comprehensive ROS was conducted with the patient and is negative except as per HPI    OBJECTIVE:  VS: BP (!) 162/90 (BP Location: Right Arm, Patient Position: Sitting, Cuff Size: Large)   Pulse 74   Ht 5\' 5"  (1.651 m)   Wt 213 lb 3.2 oz (96.7 kg)   SpO2 99%   BMI 35.48 kg/m    Wt Readings from Last 3 Encounters:  09/03/23 213 lb 3.2 oz (96.7 kg)  07/27/23 211 lb (95.7 kg)  07/07/23 211 lb 3.2 oz (95.8 kg)     EXAM: General: Pt appears well and is in NAD  Neck: General: Supple without adenopathy. Thyroid: Right thyroid nodule appreciated.   Lungs: Clear with good BS bilat   Heart: Auscultation: RRR.  Abdomen: Soft, nontender  Extremities:  BL LE: No pretibial edema   Mental Status: Judgment, insight: Intact Orientation: Oriented to time, place, and person Mood and affect: No depression, anxiety, or agitation     DATA REVIEWED:   Latest Reference Range & Units 09/03/23 13:43  TSH 0.35 - 5.50 uIU/mL 1.21  Triiodothyronine,Free,Serum 2.3 - 4.2 pg/mL 3.4  T4,Free(Direct) 0.60 - 1.60 ng/dL 4.09      Thyroid ultrasound 10/28/2020   TECHNIQUE: Ultrasound examination of the thyroid gland and adjacent soft tissues was performed.   COMPARISON:  None.   FINDINGS: Parenchymal Echotexture: Mildly heterogenous   Isthmus: 1 cm thickness   Right lobe: 6 x 2.5 x 2.5 cm   Left lobe: 4.7 x 1.8 x 1.5 cm   _________________________________________________________   Estimated total number of nodules >/= 1 cm: 1   Number of spongiform nodules >/=  2 cm not described below (TR1): 0   Number of mixed cystic and solid nodules >/= 1.5 cm not described below (TR2): 0    _________________________________________________________   Nodule # 1:   Location: Right; Mid   Maximum size: 3.5 cm; Other 2 dimensions:   1.8 x 2.4 cm   Composition: solid/almost completely solid (2)   Echogenicity: isoechoic (1)   Shape: not taller-than-wide (0)   Margins: ill-defined (0)   Echogenic foci: none (0)   ACR TI-RADS total points: 3.   ACR TI-RADS risk category: TR3 (3 points).   ACR TI-RADS recommendations:   **Given size (>/= 2.5 cm) and appearance, fine needle aspiration of this mildly suspicious nodule should be considered based on TI-RADS criteria.   _________________________________________________________   At least 2 subcentimeter hypoechoic left nodules, which do not meet criteria for follow-up or biopsy.   No regional adenopathy encountered.   IMPRESSION: 1. Mild asymmetric thyromegaly  with bilateral nodules. Recommend FNA biopsy of mildly suspicious 3.5 cm mid right nodule.      FNA right mid nodule 11/06/2020  Clinical History: 3.5 cm x 1.8 cm x 2.4 cm RMP  Specimen Submitted:  A. THYROID, RMP, FINE NEEDLE ASPIRATION:    FINAL MICROSCOPIC DIAGNOSIS:  - Consistent with benign follicular nodule (Bethesda category II)   SPECIMEN ADEQUACY:  Satisfactory but limited for evaluation, scant cellularity    Pathology report 10/01/2020  FINAL MICROSCOPIC DIAGNOSIS:  A. FALLOPIAN TUBE AND OVARY, LEFT, SALPINGO-OOPHORECTOMY: Ovary: - Stroma ovarii with papillary thyroid carcinoma.  See oncology table.  Fallopian tube: - No significant histopathologic findings.  B. UTERUS AND CERVIX, HYSTERECTOMY: Cervix: - Mild acute and chronic cervicitis.  Uterus: - Endometrium: Endometrial polyp. - Myometrium: Leiomyomata. - Serosa: No significant histopathologic findings.  ONCOLOGY TABLE:  OVARY or FALLOPIAN TUBE or PRIMARY PERITONEUM:  Procedure: Hysterectomy and left salpingo-oophorectomy Specimen Integrity: Ovary: Capsule  disrupted Tumor Site: Left ovary Tumor Size: Greatest dimension: 9 cm (See comment) Ovarian Surface Involvement: Not identified Histologic Type: Struma ovarii with papillary thyroid carcinoma Other Tissue/Organ Involvement: Not identified Peritoneal/Ascitic Fluid: Benign Regional Lymph Nodes: Not applicable (no regional lymph nodes submitted or found) Pathologic Stage Classification (pTNM, AJCC 8th Edition): pT1a, pN not assigned Representative Tumor Block: A6 Comment: TTF-1 and Thyroglobulin are positive in papillary thyroid carcinoma.  Calretinin, Inhibin and WT-1 are negative.  Overall, the ovarian mass measures 9 cm in greatest dimension, and the papillary thyroid carcinoma component measures 1.8 cm in greatest dimension. Results reported to [facility] on 10/04/2000.  Dr. Berneice Heinrich reviewed select slides. (v1.1.1.0)   Old records , labs and images have been reviewed.     ASSESSMENT/PLAN/RECOMMENDATIONS:   Multinodular goiter:  -No local neck symptoms -Patient is clinically euthyroid -She is s/p FNA of the right mid nodule in 2021 with benign cytology, but the sample was described as insufficient cellularity? -Will proceed with repeating thyroid ultrasound I will have low threshold for repeating FNA     2. Malignant Thyroid Type- Papillary Neoplasm in Struma Ovarii :  -This has been thought to be low risk with a PTC size of 1.8 cm, no metastases -S/p resection of the left ovary and fallopian tube as well as hysterectomy -Due to the rarity  of malignant struma ovarii and it is available literature data, no formal standard criteria have been established regarding his diagnosis, management and follow-up. -Will proceed with thyroglobulin and thyroglobulin antibody levels -Tumor size of 2 cm or less is associated with a low chance of recurrence.   Signed electronically by: Lyndle Herrlich, MD  [facility]  [facility] [address] [place], [place] [address] Phone: [phone] FAX: [phone]   CC: Dorothyann Peng, MD [address] [place] [address] Phone: [phone] Fax: [phone]   Return to Endocrinology clinic as below: Future Appointments  Date Time Provider Department Center  10/06/2023  1:30 PM [facility] None  10/06/2023  2:00 PM Malachy Mood, MD [facility] None  11/29/2023  9:40 AM Dorothyann Peng, MD [facility] None  03/03/2024  1:00 PM Donnamaria Shands, Konrad Dolores, MD [facility] None  03/29/2024 10:00 AM [facility]NU[facility] WELLNESS VISIT [facility] None  03/29/2024 10:40 AM Dorothyann Peng, MD [facility] None

## 2023-09-06 LAB — THYROGLOBULIN ANTIBODY: Thyroglobulin Ab: <1 [IU]/mL (ref <=1)

## 2023-09-06 LAB — THYROGLOBULIN LEVEL: Thyroglobulin: 54.6 ng/mL — ABNORMAL HIGH

## 2023-09-06 LAB — CA 125: CA 125: 9 U/mL (ref <35)

## 2023-09-08 ENCOUNTER — Ambulatory Visit
Admission: RE | Admit: 2023-09-08 | Discharge: 2023-09-08 | Disposition: A | Discharge status: Home / Self Care | Payer: Medicare Other | Source: Ambulatory Visit | Attending: Internal Medicine | Admitting: Internal Medicine

## 2023-09-08 DIAGNOSIS — Z85850 Personal history of malignant neoplasm of thyroid: Secondary | ICD-10-CM

## 2023-09-17 ENCOUNTER — Telehealth: Payer: Self-pay | Admitting: Internal Medicine

## 2023-09-17 DIAGNOSIS — Z85850 Personal history of malignant neoplasm of thyroid: Secondary | ICD-10-CM

## 2023-09-17 DIAGNOSIS — C562 Malignant neoplasm of left ovary: Secondary | ICD-10-CM

## 2023-09-17 NOTE — Telephone Encounter (Signed)
Patient advised and would like to mover forward with biopsy

## 2023-09-17 NOTE — Telephone Encounter (Signed)
Can you please contact the patient let her know that her thyroid nodule is stable, that being said I would recommend proceeding with a biopsy because the last time they tried to biopsy it they could not get proper cell to test them.  With her history of thyroid cancer in the ovary, I would highly recommend that we proceed with another biopsy of the thyroid gland    Thank you

## 2023-10-05 ENCOUNTER — Other Ambulatory Visit: Payer: Self-pay

## 2023-10-05 DIAGNOSIS — D0511 Intraductal carcinoma in situ of right breast: Secondary | ICD-10-CM

## 2023-10-05 DIAGNOSIS — Z1239 Encounter for other screening for malignant neoplasm of breast: Secondary | ICD-10-CM

## 2023-10-06 ENCOUNTER — Encounter: Payer: Self-pay | Admitting: Hematology

## 2023-10-06 ENCOUNTER — Inpatient Hospital Stay (HOSPITAL_BASED_OUTPATIENT_CLINIC_OR_DEPARTMENT_OTHER): Payer: Medicare Other | Admitting: Hematology

## 2023-10-06 ENCOUNTER — Inpatient Hospital Stay: Payer: Medicare Other | Attending: Adult Health

## 2023-10-06 VITALS — BP 185/98 | HR 72 | Temp 98.0°F | Resp 18 | Ht 65.0 in | Wt 213.6 lb

## 2023-10-06 DIAGNOSIS — Z90721 Acquired absence of ovaries, unilateral: Secondary | ICD-10-CM | POA: Diagnosis not present

## 2023-10-06 DIAGNOSIS — I10 Essential (primary) hypertension: Secondary | ICD-10-CM | POA: Diagnosis not present

## 2023-10-06 DIAGNOSIS — Z91041 Radiographic dye allergy status: Secondary | ICD-10-CM | POA: Diagnosis not present

## 2023-10-06 DIAGNOSIS — D0511 Intraductal carcinoma in situ of right breast: Secondary | ICD-10-CM | POA: Diagnosis not present

## 2023-10-06 DIAGNOSIS — Z90710 Acquired absence of both cervix and uterus: Secondary | ICD-10-CM | POA: Insufficient documentation

## 2023-10-06 DIAGNOSIS — Z170 Estrogen receptor positive status [ER+]: Secondary | ICD-10-CM | POA: Insufficient documentation

## 2023-10-06 DIAGNOSIS — Z923 Personal history of irradiation: Secondary | ICD-10-CM | POA: Diagnosis not present

## 2023-10-06 DIAGNOSIS — Z79899 Other long term (current) drug therapy: Secondary | ICD-10-CM | POA: Insufficient documentation

## 2023-10-06 DIAGNOSIS — Z1721 Progesterone receptor positive status: Secondary | ICD-10-CM | POA: Diagnosis not present

## 2023-10-06 LAB — CMP (CANCER CENTER ONLY)
ALT: 10 U/L (ref 0–44)
AST: 15 U/L (ref 15–41)
Albumin: 4.2 g/dL (ref 3.5–5.0)
Alkaline Phosphatase: 88 U/L (ref 38–126)
Anion gap: 6 (ref 5–15)
BUN: 13 mg/dL (ref 8–23)
CO2: 29 mmol/L (ref 22–32)
Calcium: 9.9 mg/dL (ref 8.9–10.3)
Chloride: 108 mmol/L (ref 98–111)
Creatinine: 0.71 mg/dL (ref 0.44–1.00)
GFR, Estimated: >60 mL/min (ref >60)
Glucose, Bld: 97 mg/dL (ref 70–99)
Potassium: 4.8 mmol/L (ref 3.5–5.1)
Sodium: 143 mmol/L (ref 135–145)
Total Bilirubin: 0.5 mg/dL (ref <1.2)
Total Protein: 7.7 g/dL (ref 6.5–8.1)

## 2023-10-06 LAB — CBC WITH DIFFERENTIAL (CANCER CENTER ONLY)
Abs Immature Granulocytes: 0.01 10*3/uL (ref 0.00–0.07)
Basophils Absolute: 0 10*3/uL (ref 0.0–0.1)
Basophils Relative: 1 %
Eosinophils Absolute: 0.1 10*3/uL (ref 0.0–0.5)
Eosinophils Relative: 1 %
HCT: 38.4 % (ref 36.0–46.0)
Hemoglobin: 12.5 g/dL (ref 12.0–15.0)
Immature Granulocytes: 0 %
Lymphocytes Relative: 42 %
Lymphs Abs: 2.6 10*3/uL (ref 0.7–4.0)
MCH: 28.8 pg (ref 26.0–34.0)
MCHC: 32.6 g/dL (ref 30.0–36.0)
MCV: 88.5 fL (ref 80.0–100.0)
Monocytes Absolute: 0.5 10*3/uL (ref 0.1–1.0)
Monocytes Relative: 7 %
Neutro Abs: 3 10*3/uL (ref 1.7–7.7)
Neutrophils Relative %: 49 %
Platelet Count: 346 10*3/uL (ref 150–400)
RBC: 4.34 MIL/uL (ref 3.87–5.11)
RDW: 14.1 % (ref 11.5–15.5)
WBC Count: 6.1 10*3/uL (ref 4.0–10.5)
nRBC: 0 % (ref 0.0–0.2)

## 2023-10-06 NOTE — Progress Notes (Signed)
[facility]   Telephone:([phone]) [phone] [phone]   Clinic Follow up Note   Patient Care Team: Dorothyann Peng, MD as PCP - General (Internal Medicine) Abigail Miyamoto, MD as Consulting Physician (General Surgery) Malachy Mood, MD as Consulting Physician (Hematology) Dorothy Puffer, MD as Consulting Physician (Radiation Oncology)  Date of Service:  10/06/2023  CHIEF COMPLAINT: f/u of right breast DCIS  CURRENT THERAPY:  Cancer surveillance  Oncology History   Ductal carcinoma in situ (DCIS) of right breast -ER+/PR+ -Diagnosed in 10/2022 -Status post right lumpectomy with negative margins (posterior margin <0.32mm) -She completed adjuvant radiation last week, overall tolerated well. -I discussed chemoprevention with tamoxifen, to reduce her risk of future breast cancer.  I recommend low-dose of 5 mg daily for 3 years.  Benefit and potential side effect discussed with her in detail, she declined    Assessment and Plan    Ductal Carcinoma In Situ (DCIS) Follow-up for DCIS diagnosed in December 2023. Post-surgery and radiation therapy with no current breast concerns or pain. Right side shows scar tissue and skin discoloration from radiation. Physical exam normal. Discussed high future breast cancer risk and additional screening with breast MRI. Patient, claustrophobic, agreed to try MRI with Ativan for anxiety. - Order diagnostic mammogram at [facility] in December 2024 - Order breast MRI at [facility] in June 2025 with Ativan for anxiety - Patient to call [facility] to schedule mammogram - Patient to call insurance to check MRI coverage  General Health Maintenance Patient aims to eat healthier, increase activity, and reduce stress. Possible stress-related hypertension. Normal blood counts, kidney, and liver function tests. - Continue healthy diet and exercise - Monitor blood pressure  Follow-up -Continue cancer surveillance, with annual mammogram and breast MRI,  6 months apart - schedule follow-up visit in one year.         SUMMARY OF ONCOLOGIC HISTORY: Oncology History  Ductal carcinoma in situ (DCIS) of right breast  11/09/2022 Initial Diagnosis   Ductal carcinoma in situ (DCIS) of right breast   12/03/2022 Pathology Results    FINAL MICROSCOPIC DIAGNOSIS:   A. BREAST, RIGHT W/SEEDS, LUMPECTOMY:  - Ductal carcinoma in situ, intermediate grade with calcifications,  approximately measuring 4 cm, see comment   - DCIS is less than 0.1 mm from the posterior margin (barbell clip)  - Biopsy site changes  - No evidence of invasive carcinoma  - See oncology table    01/11/2023 - 02/05/2023 Radiation Therapy   Site Technique Total Dose (Gy) Dose per Fx (Gy) Completed Fx Beam Energies  Breast, Right: Breast_R 3D 42.56/42.56 2.66 16/16 10X  Breast, Right: Breast_R_Bst 3D 8/8 2 4/4 6X     01/2023 -  Anti-estrogen oral therapy   Tamoxifen daily recommended.    05/11/2023 Cancer Staging   Staging form: Breast, AJCC 8th Edition - Pathologic: Stage 0 (pTis (DCIS), pN0, cM0) - Signed by Loa Socks, NP on 05/11/2023      Discussed the use of AI scribe software for clinical note transcription with the patient, who gave verbal consent to proceed.  History of Present Illness   The patient, a 67 year old female with a history of Ductal Carcinoma in Situ (DCS), presents for a routine follow-up. She reports no new symptoms or concerns since her last visit. She has been focusing on lifestyle modifications, including a healthier diet, increased physical activity, and stress management. She has been avoiding red meats and incorporating more fruits into her diet. She has also been walking more and  trying to maintain a stress-free lifestyle. However, she experienced a stressful event today when her car started acting up on the way to the appointment, which she believes may have affected her blood pressure. She has not noticed any concerns with her  breasts and reports no pain or discomfort at the previous surgical site. She has been checking her breasts regularly and everything seems to be fine. She had a mammogram before her surgery in December and is due for another one at the end of this year. She also has a biopsy scheduled for her thyroid later this month.         All other systems were reviewed with the patient and are negative.  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   PONV (postoperative nausea and vomiting)    Pre-diabetes     SURGICAL HISTORY: Past Surgical History:  Procedure Laterality Date   BREAST BIOPSY Right 11/27/2013   Procedure: RIGHT BREAST BIOPSY WITH NEEDLE LOCALIZATION;  Surgeon: Adolph Pollack, MD;  Location: [facility];  Service: General;  Laterality: Right;   BREAST LUMPECTOMY WITH RADIOACTIVE SEED LOCALIZATION Right 12/03/2022   Procedure: RIGHT BREAST LUMPECTOMY WITH RADIOACTIVE SEED LOCALIZATION X2;  Surgeon: Abigail Miyamoto, MD;  Location: [facility];  Service: General;  Laterality: Right;   COLONOSCOPY     JOINT REPLACEMENT  2004   right total hip   LYSIS OF ADHESION N/A 10/01/2020   Procedure: LYSIS OF ADHESION;  Surgeon: Adolphus Birchwood, MD;  Location: [facility];  Service: Gynecology;  Laterality: N/A;   OVARIAN CYST REMOVAL  1980   ROBOTIC ASSISTED TOTAL HYSTERECTOMY WITH BILATERAL SALPINGO OOPHERECTOMY N/A 10/01/2020   Procedure: XI ROBOTIC ASSISTED TOTAL HYSTERECTOMY WITH UNILATERL  SALPINGO OOPHORECTOMY;  Surgeon: Adolphus Birchwood, MD;  Location: [facility];  Service: Gynecology;  Laterality: N/A;   TOTAL HIP ARTHROPLASTY Right 2004   TOTAL HIP ARTHROPLASTY Left 02/12/2022    I have reviewed the social history and family history with the patient and they are unchanged from previous note.  ALLERGIES:  is allergic to contrast media [iodinated contrast  media].  MEDICATIONS:  Current Outpatient Medications  Medication Sig Dispense Refill   amLODipine (NORVASC) 5 MG tablet Take 1 tablet (5 mg total) by mouth daily. 30 tablet 11   diphenhydrAMINE (BENADRYL) 25 MG tablet Take 25 mg by mouth every 8 (eight) hours as needed for allergies.     ibuprofen (ADVIL) 200 MG tablet Take 200 mg by mouth every 6 (six) hours as needed for headache or moderate pain.     loratadine (CLARITIN) 10 MG tablet Take 10 mg by mouth daily as needed for allergies.     Multiple Vitamins-Minerals (MULTIVITAMIN WITH MINERALS) tablet Take 1 tablet by mouth daily.     valsartan-hydrochlorothiazide (DIOVAN-HCT) 320-25 MG tablet Take 1 tablet by mouth daily. 90 tablet 2   No current facility-administered medications for this visit.    PHYSICAL EXAMINATION: ECOG PERFORMANCE STATUS: 0 - Asymptomatic  Vitals:   10/06/23 1420  BP: (!) 185/98  Pulse: 72  Resp: 18  Temp: 98 F (36.7 C)  SpO2: 100%   Wt Readings from Last 3 Encounters:  10/06/23 213 lb 9.6 oz (96.9 kg)  09/03/23 213 lb 3.2 oz (96.7 kg)  07/27/23 211 lb (95.7 kg)     GENERAL:alert, no distress  and comfortable SKIN: skin color, texture, turgor are normal, no rashes or significant lesions EYES: normal, Conjunctiva are pink and non-injected, sclera clear NECK: supple, thyroid normal size, non-tender, without nodularity LYMPH:  no palpable lymphadenopathy in the cervical, axillary  LUNGS: clear to auscultation and percussion with normal breathing effort HEART: regular rate & rhythm and no murmurs and no lower extremity edema ABDOMEN:abdomen soft, non-tender and normal bowel sounds Musculoskeletal:no cyanosis of digits and no clubbing  NEURO: alert & oriented x 3 with fluent speech, no focal motor/sensory deficits BREAST: Right breast exhibits scar tissue, no tenderness upon palpation. Skin on right breast darker than left, discoloration noted. Left breast normal.      LABORATORY DATA:  I have  reviewed the data as listed    Latest Ref Rng & Units 10/06/2023    1:26 PM 11/11/2022    8:24 AM 01/05/2022   12:32 PM  CBC  WBC 4.0 - 10.5 K/uL 6.1  6.5  5.6   Hemoglobin 12.0 - 15.0 g/dL 01.0  27.2  53.6   Hematocrit 36.0 - 46.0 % 38.4  37.8  37.6   Platelets 150 - 400 K/uL 346  363  399         Latest Ref Rng & Units 10/06/2023    1:26 PM 07/27/2023   11:24 AM 03/17/2023   11:34 AM  CMP  Glucose 70 - 99 mg/dL 97  644  90   BUN 8 - 23 mg/dL 13  13  11    Creatinine 0.44 - 1.00 mg/dL 0.34  7.42  5.95   Sodium 135 - 145 mmol/L 143  140  141   Potassium 3.5 - 5.1 mmol/L 4.8  4.1  4.4   Chloride 98 - 111 mmol/L 108  103  103   CO2 22 - 32 mmol/L 29  22  21    Calcium 8.9 - 10.3 mg/dL 9.9  9.9  63.8   Total Protein 6.5 - 8.1 g/dL 7.7   6.9   Total Bilirubin <1.2 mg/dL 0.5   0.4   Alkaline Phos 38 - 126 U/L 88   112   AST 15 - 41 U/L 15   17   ALT 0 - 44 U/L 10   12       RADIOGRAPHIC STUDIES: I have personally reviewed the radiological images as listed and agreed with the findings in the report. No results found.    Orders Placed This Encounter  Procedures   MM 3D DIAGNOSTIC MAMMOGRAM BILATERAL BREAST    Standing Status:   Future    Standing Expiration Date:   10/05/2024    Scheduling Instructions:     [facility]    Order Specific Question:   Reason for Exam (SYMPTOM  OR DIAGNOSIS REQUIRED)    Answer:   SCREENING    Order Specific Question:   Preferred imaging location?    Answer:   External   MR BREAST BILATERAL W WO CONTRAST INC CAD    Standing Status:   Future    Standing Expiration Date:   10/05/2024    Order Specific Question:   If indicated for the ordered procedure, I authorize the administration of contrast media per Radiology protocol    Answer:   Yes    Order Specific Question:   What is the patient's sedation requirement?    Answer:   No Sedation    Order Specific Question:   Does the patient have a pacemaker or implanted devices?    Answer:  No    Order  Specific Question:   Radiology Contrast Protocol - do NOT remove file path    Answer:   [email or web address]    Order Specific Question:   Preferred imaging location?    Answer:   [facility] (table limit-550lbs)   All questions were answered. The patient knows to call the clinic with any problems, questions or concerns. No barriers to learning was detected. The total time spent in the appointment was 20 minutes.     Malachy Mood, MD 10/06/2023

## 2023-10-06 NOTE — Assessment & Plan Note (Addendum)
-  ER+/PR+ -Diagnosed in 10/2022 -Status post right lumpectomy with negative margins (posterior margin <0.59mm) -She completed adjuvant radiation last week, overall tolerated well. -I discussed chemoprevention with tamoxifen, to reduce her risk of future breast cancer.  I recommend low-dose of 5 mg daily for 3 years.  Benefit and potential side effect discussed with her in detail, she declined

## 2023-10-18 ENCOUNTER — Other Ambulatory Visit (HOSPITAL_COMMUNITY)
Admission: RE | Admit: 2023-10-18 | Discharge: 2023-10-18 | Disposition: A | Discharge status: Home / Self Care | Payer: Medicare Other | Source: Ambulatory Visit | Attending: Interventional Radiology | Admitting: Interventional Radiology

## 2023-10-18 ENCOUNTER — Ambulatory Visit
Admission: RE | Admit: 2023-10-18 | Discharge: 2023-10-18 | Disposition: A | Discharge status: Home / Self Care | Payer: Medicare Other | Source: Ambulatory Visit | Attending: Internal Medicine | Admitting: Internal Medicine

## 2023-10-18 DIAGNOSIS — Z85850 Personal history of malignant neoplasm of thyroid: Secondary | ICD-10-CM

## 2023-10-18 DIAGNOSIS — E042 Nontoxic multinodular goiter: Secondary | ICD-10-CM | POA: Diagnosis present

## 2023-10-18 DIAGNOSIS — C562 Malignant neoplasm of left ovary: Secondary | ICD-10-CM

## 2023-10-19 LAB — CYTOLOGY - NON PAP

## 2023-10-25 ENCOUNTER — Telehealth: Payer: Self-pay | Admitting: Internal Medicine

## 2023-10-25 NOTE — Telephone Encounter (Signed)
Spoke to the pt in 10/25/2023  at 1 pm    Discussed the FNA results consistent with scant cellularity. This is the second attempt at FNA and I have recommended hemithyroidectomy .  Pt is not ready to make this decision yet . We have opted for serial ultrasound , of note, nodule has been stable in 2 yrs    Abby Raelyn Mora, MD  [facility]  [facility] [address] [place], [place] [address] Phone: [phone] FAX: [phone]

## 2023-10-26 ENCOUNTER — Telehealth: Payer: Self-pay

## 2023-10-26 NOTE — Telephone Encounter (Signed)
Patient would like to speak about biopsy result and she has some additional questions about information given yesterday. Tried to get the question but patient would just like the callback whenever you have a moment.

## 2023-10-28 ENCOUNTER — Encounter: Payer: Self-pay | Admitting: Internal Medicine

## 2023-10-28 ENCOUNTER — Ambulatory Visit: Payer: Medicare Other | Admitting: Internal Medicine

## 2023-10-28 VITALS — BP 122/80 | HR 74 | Ht 65.0 in | Wt 213.0 lb

## 2023-10-28 DIAGNOSIS — E042 Nontoxic multinodular goiter: Secondary | ICD-10-CM

## 2023-10-28 DIAGNOSIS — C562 Malignant neoplasm of left ovary: Secondary | ICD-10-CM

## 2023-10-28 DIAGNOSIS — Z85850 Personal history of malignant neoplasm of thyroid: Secondary | ICD-10-CM

## 2023-10-28 NOTE — Progress Notes (Signed)
Name: Candice Ayala  MRN/ DOB: [id number], 08-11-1956    Age/ Sex: 67 y.o., female    PCP: Dorothyann Peng, MD   Reason for Endocrinology Evaluation:      Date of Initial Endocrinology Evaluation: 09/03/2023    HPI: [name] is a 67 y.o. 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 The patient presented for initial endocrinology clinic visit on 09/03/2023 for consultative assistance with her history of PTC.   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  Her tumor was determined to be low risk based on classic papillary architecture, no extraovarian extension and tumor <4 cm.  BRAF testing was negative    Thyroid ultrasound 10/2020 revealed a right mid 3.5 cm nodule meeting FNA criteria as well as subcentimeter nodules on the left. She is s/p FNA of the right mid nodule 11/06/2020 with scant cellularity, but it was read as benign.    She was diagnosed with right breast DCIS in 2023, treated with lumpectomy and adjuvant radiation therapy.     NO FH of thyroid disease Mother with hx colon cancer   SUBJECTIVE:    Today (10/28/23):  Candice Ayala is here for follow-up on multinodular goiter  Ms. Sledge wanted to discuss her most recent thyroid ultrasound as well as recent scant cellularity on right thyroid nodule FNA.   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   PONV (postoperative nausea and  vomiting)    Pre-diabetes    Past Surgical History:  Past Surgical History:  Procedure Laterality Date   BREAST BIOPSY Right 11/27/2013   Procedure: RIGHT BREAST BIOPSY WITH NEEDLE LOCALIZATION;  Surgeon: Adolph Pollack, MD;  Location: [facility];  Service: General;  Laterality: Right;   BREAST LUMPECTOMY WITH RADIOACTIVE SEED LOCALIZATION Right 12/03/2022   Procedure: RIGHT BREAST LUMPECTOMY WITH RADIOACTIVE SEED LOCALIZATION X2;  Surgeon: Abigail Miyamoto, MD;  Location: [facility];  Service: General;  Laterality: Right;   COLONOSCOPY     JOINT REPLACEMENT  2004   right total hip   LYSIS OF ADHESION N/A 10/01/2020   Procedure: LYSIS OF ADHESION;  Surgeon: Adolphus Birchwood, MD;  Location: [facility];  Service: Gynecology;  Laterality: N/A;   OVARIAN CYST REMOVAL  1980   ROBOTIC ASSISTED TOTAL HYSTERECTOMY WITH BILATERAL SALPINGO OOPHERECTOMY N/A 10/01/2020   Procedure: XI ROBOTIC ASSISTED TOTAL HYSTERECTOMY WITH UNILATERL  SALPINGO OOPHORECTOMY;  Surgeon: Adolphus Birchwood, MD;  Location: [facility];  Service: Gynecology;  Laterality: N/A;   TOTAL HIP ARTHROPLASTY Right 2004   TOTAL HIP ARTHROPLASTY Left 02/12/2022    Social History:  reports that she has never smoked. She has never used smokeless tobacco. She reports that she does not drink alcohol and does not use drugs. Family History: family history includes Breast cancer in her maternal grandmother; Colon cancer in her mother; Congestive Heart Failure  in her brother; Early death in her father; Hypertension in her brother.   HOME MEDICATIONS: Allergies as of 10/28/2023       Reactions   Contrast Media [iodinated Contrast Media] Hives        Medication List        Accurate as of October 28, 2023 10:42 AM. If you have any questions, ask your nurse or doctor.          amLODipine 5 MG tablet Commonly known as: NORVASC Take 1 tablet (5 mg total) by mouth daily.   diphenhydrAMINE 25 MG tablet Commonly known as: BENADRYL Take  25 mg by mouth every 8 (eight) hours as needed for allergies.   ibuprofen 200 MG tablet Commonly known as: ADVIL Take 200 mg by mouth every 6 (six) hours as needed for headache or moderate pain.   loratadine 10 MG tablet Commonly known as: CLARITIN Take 10 mg by mouth daily as needed for allergies.   multivitamin with minerals tablet Take 1 tablet by mouth daily.   valsartan-hydrochlorothiazide 320-25 MG tablet Commonly known as: DIOVAN-HCT Take 1 tablet by mouth daily.          REVIEW OF SYSTEMS: A comprehensive ROS was conducted with the patient and is negative except as per HPI    OBJECTIVE:  VS: BP 122/80 (BP Location: Left Arm, Patient Position: Sitting, Cuff Size: Large)   Pulse 74   Ht 5\' 5"  (1.651 m)   Wt 213 lb (96.6 kg)   SpO2 96%   BMI 35.45 kg/m    Wt Readings from Last 3 Encounters:  10/28/23 213 lb (96.6 kg)  10/06/23 213 lb 9.6 oz (96.9 kg)  09/03/23 213 lb 3.2 oz (96.7 kg)     EXAM: General: Pt appears well and is in NAD  Neck: General: Supple without adenopathy. Thyroid: Right thyroid nodule appreciated.   Lungs: Clear with good BS bilat   Heart: Auscultation: RRR.  Abdomen: Soft, nontender  Extremities:  BL LE: No pretibial edema   Mental Status: Judgment, insight: Intact Orientation: Oriented to time, place, and person Mood and affect: No depression, anxiety, or agitation     DATA REVIEWED:   Latest Reference Range & Units 09/03/23 13:43  TSH 0.35 - 5.50 uIU/mL 1.21  Triiodothyronine,Free,Serum 2.3 - 4.2 pg/mL 3.4  T4,Free(Direct) 0.60 - 1.60 ng/dL 6.96  Thyroglobulin ng/mL 54.6 (H)  Thyroglobulin Ab < or = 1 IU/mL <1  (H): Data is abnormally high   Thyroid ultrasound 09/08/2023  Estimated total number of nodules >/= 1 cm: 1   Number of spongiform nodules >/=  2 cm not described below (TR1): 0   Number of mixed cystic and solid nodules >/= 1.5 cm not described below (TR2): 0    _________________________________________________________   Nodule labeled 1 in the mid right thyroid lobe is a previously biopsied solid nodule measuring up to 3.5 cm, previously 3.5 cm when measured in a similar fashion. The nodule overall remains similar in size and morphology.   A couple of additional small subcentimeter nodule in the left thyroid lobe do not meet criteria for further dedicated follow-up or biopsy, as previously noted.   IMPRESSION: 1. Multinodular thyroid gland.  No new or enlarging thyroid nodules. 2. Similar appearance of previously biopsied nodule in the right thyroid lobe. Correlate with biopsy results.         FNA right mid nodule 10/18/2023   Clinical History: Nodule labeled 1 in the mid right thyroid lobe is a  previously biopsied solid nodule measuring up to 3.5cm, previuosly 3.5cm  when measured in a similar fashion, The nodule overall remains similar  in size and morphology  Specimen Submitted:  A. THYROID, RIGHT MID, FINE NEEDLE ASPIRATION    FINAL MICROSCOPIC DIAGNOSIS:  - Scant follicular epithelium present (Bethesda category I)    FNA right mid nodule 11/06/2020  Clinical History: 3.5 cm x 1.8 cm x 2.4 cm RMP  Specimen Submitted:  A. THYROID, RMP, FINE NEEDLE ASPIRATION:    FINAL MICROSCOPIC DIAGNOSIS:  - Consistent with benign follicular nodule (Bethesda category II)   SPECIMEN ADEQUACY:  Satisfactory but limited for evaluation, scant cellularity    Pathology report 10/01/2020  FINAL MICROSCOPIC DIAGNOSIS:  A. FALLOPIAN TUBE AND OVARY, LEFT, SALPINGO-OOPHORECTOMY: Ovary: - Stroma ovarii with papillary thyroid carcinoma.  See oncology table.  Fallopian tube: - No significant histopathologic findings.  B. UTERUS AND CERVIX, HYSTERECTOMY: Cervix: - Mild acute and chronic cervicitis.  Uterus: - Endometrium: Endometrial polyp. - Myometrium: Leiomyomata. - Serosa: No significant histopathologic findings.  ONCOLOGY  TABLE:  OVARY or FALLOPIAN TUBE or PRIMARY PERITONEUM:  Procedure: Hysterectomy and left salpingo-oophorectomy Specimen Integrity: Ovary: Capsule disrupted Tumor Site: Left ovary Tumor Size: Greatest dimension: 9 cm (See comment) Ovarian Surface Involvement: Not identified Histologic Type: Struma ovarii with papillary thyroid carcinoma Other Tissue/Organ Involvement: Not identified Peritoneal/Ascitic Fluid: Benign Regional Lymph Nodes: Not applicable (no regional lymph nodes submitted or found) Pathologic Stage Classification (pTNM, AJCC 8th Edition): pT1a, pN not assigned Representative Tumor Block: A6 Comment: TTF-1 and Thyroglobulin are positive in papillary thyroid carcinoma.  Calretinin, Inhibin and WT-1 are negative.  Overall, the ovarian mass measures 9 cm in greatest dimension, and the papillary thyroid carcinoma component measures 1.8 cm in greatest dimension. Results reported to [facility] on 10/04/2000.  Dr. Berneice Heinrich reviewed select slides. (v1.1.1.0)   Old records , labs and images have been reviewed.     ASSESSMENT/PLAN/RECOMMENDATIONS:   Multinodular goiter:  -No local neck symptoms -Patient is clinically euthyroid -She is s/p FNA of the right mid nodule in 2021 with  SCANT CELLULARITY but somehow this was read as benign ????? -Repeat FNA of the right nodule 09/2023 revealed scant cellularity (Bethesda category I) I have recommended proceeding with hemithyroidectomy, but the patient would like to avoid any surgical intervention especially with the size of her thyroid nodule stable at 3.5 cm over the past 2 years -We opted to monitor with annual ultrasound and have a low threshold for surgical intervention    2. Malignant Thyroid Type- Papillary Neoplasm in Struma Ovarii :  -This has been thought to be low risk with a PTC size of 1.8 cm, no metastases -S/p resection of the left ovary and fallopian tube as well as hysterectomy -Due to the rarity  of malignant  struma ovarii and it is available literature data, no formal standard criteria have been established regarding his diagnosis, management and follow-up. -Tumor size of 2 cm or less is associated with a low chance of recurrence. -Thyroglobulin elevated, with undetectable thyroglobulin antibodies   I spent 29 minutes preparing to see the patient by review of recent labs, imaging and procedures, obtaining and reviewing separately obtained history, communicating with the patient, ordering medications, tests or procedures, and documenting clinical information in the EHR including the differential Dx, treatment, and any further evaluation and other management    Signed electronically by: Lyndle Herrlich, MD  [facility]  [facility] [address] [place], [place]  [address] Phone: [phone] FAX: [phone]   CC: Dorothyann Peng, MD [address] [place] [address] Phone: [phone] Fax: [phone]   Return to Endocrinology clinic as below: Future Appointments  Date Time Provider Department Center  11/29/2023  9:40 AM Dorothyann Peng, MD [facility] None  03/03/2024  1:00 PM Messina Kosinski, Konrad Dolores, MD [facility] None  03/29/2024 10:00 AM [facility]-ANNUAL WELLNESS VISIT [facility] None  03/29/2024 10:40 AM Dorothyann Peng, MD [facility] None

## 2023-11-29 ENCOUNTER — Ambulatory Visit: Payer: Medicare Other | Admitting: Internal Medicine

## 2024-01-03 ENCOUNTER — Encounter: Payer: Self-pay | Admitting: Internal Medicine

## 2024-01-03 ENCOUNTER — Ambulatory Visit (INDEPENDENT_AMBULATORY_CARE_PROVIDER_SITE_OTHER): Payer: Medicare Other | Admitting: Internal Medicine

## 2024-01-03 VITALS — BP 160/100 | HR 80 | Temp 98.5°F | Ht 65.0 in | Wt 212.8 lb

## 2024-01-03 DIAGNOSIS — E66812 Obesity, class 2: Secondary | ICD-10-CM | POA: Diagnosis not present

## 2024-01-03 DIAGNOSIS — C562 Malignant neoplasm of left ovary: Secondary | ICD-10-CM

## 2024-01-03 DIAGNOSIS — I10 Essential (primary) hypertension: Secondary | ICD-10-CM | POA: Diagnosis not present

## 2024-01-03 DIAGNOSIS — E042 Nontoxic multinodular goiter: Secondary | ICD-10-CM | POA: Diagnosis not present

## 2024-01-03 DIAGNOSIS — R7303 Prediabetes: Secondary | ICD-10-CM | POA: Diagnosis not present

## 2024-01-03 DIAGNOSIS — Z6835 Body mass index (BMI) 35.0-35.9, adult: Secondary | ICD-10-CM

## 2024-01-03 LAB — BMP8+EGFR
BUN/Creatinine Ratio: 24 (ref 12–28)
BUN: 14 mg/dL (ref 8–27)
CO2: 24 mmol/L (ref 20–29)
Calcium: 9.6 mg/dL (ref 8.7–10.3)
Chloride: 106 mmol/L (ref 96–106)
Creatinine, Ser: 0.59 mg/dL (ref 0.57–1.00)
Glucose: 101 mg/dL — ABNORMAL HIGH (ref 70–99)
Potassium: 5 mmol/L (ref 3.5–5.2)
Sodium: 145 mmol/L — ABNORMAL HIGH (ref 134–144)
eGFR: 97 mL/min/{1.73_m2} (ref >59)

## 2024-01-03 LAB — HEMOGLOBIN A1C
Est. average glucose Bld gHb Est-mCnc: 137 mg/dL
Hgb A1c MFr Bld: 6.4 % — ABNORMAL HIGH (ref 4.8–5.6)

## 2024-01-03 MED ORDER — AMLODIPINE BESYLATE 5 MG PO TABS: 5.0000 mg | ORAL_TABLET | Freq: Every day | ORAL | 2 refills | Status: DC

## 2024-01-03 NOTE — Assessment & Plan Note (Signed)
 She is encouraged to increase her exercise to 30 minutes five days weekly to help her achieve BMI<30 to decrease cardiac risk.

## 2024-01-03 NOTE — Patient Instructions (Signed)
 Hypertension, Adult Hypertension is another name for high blood pressure. High blood pressure forces your heart to work harder to pump blood. This can cause problems over time. There are two numbers in a blood pressure reading. There is a top number (systolic) over a bottom number (diastolic). It is best to have a blood pressure that is below 120/80. What are the causes? The cause of this condition is not known. Some other conditions can lead to high blood pressure. What increases the risk? Some lifestyle factors can make you more likely to develop high blood pressure: Smoking. Not getting enough exercise or physical activity. Being overweight. Having too much fat, sugar, calories, or salt (sodium) in your diet. Drinking too much alcohol. Other risk factors include: Having any of these conditions: Heart disease. Diabetes. High cholesterol. Kidney disease. Obstructive sleep apnea. Having a family history of high blood pressure and high cholesterol. Age. The risk increases with age. Stress. What are the signs or symptoms? High blood pressure may not cause symptoms. Very high blood pressure (hypertensive crisis) may cause: Headache. Fast or uneven heartbeats (palpitations). Shortness of breath. Nosebleed. Vomiting or feeling like you may vomit (nauseous). Changes in how you see. Very bad chest pain. Feeling dizzy. Seizures. How is this treated? This condition is treated by making healthy lifestyle changes, such as: Eating healthy foods. Exercising more. Drinking less alcohol. Your doctor may prescribe medicine if lifestyle changes do not help enough and if: Your top number is above 130. Your bottom number is above 80. Your personal target blood pressure may vary. Follow these instructions at home: Eating and drinking  If told, follow the DASH eating plan. To follow this plan: Fill one half of your plate at each meal with fruits and vegetables. Fill one fourth of your plate  at each meal with whole grains. Whole grains include whole-wheat pasta, brown rice, and whole-grain bread. Eat or drink low-fat dairy products, such as skim milk or low-fat yogurt. Fill one fourth of your plate at each meal with low-fat (lean) proteins. Low-fat proteins include fish, chicken without skin, eggs, beans, and tofu. Avoid fatty meat, cured and processed meat, or chicken with skin. Avoid pre-made or processed food. Limit the amount of salt in your diet to less than 1,500 mg each day. Do not drink alcohol if: Your doctor tells you not to drink. You are pregnant, may be pregnant, or are planning to become pregnant. If you drink alcohol: Limit how much you have to: 0-1 drink a day for women. 0-2 drinks a day for men. Know how much alcohol is in your drink. In the [place] one drink equals one 12 oz bottle of beer (355 mL), one 5 oz glass of wine (148 mL), or one 1 oz glass of hard liquor (44 mL). Lifestyle  Work with your doctor to stay at a healthy weight or to lose weight. Ask your doctor what the best weight is for you. Get at least 30 minutes of exercise that causes your heart to beat faster (aerobic exercise) most days of the week. This may include walking, swimming, or biking. Get at least 30 minutes of exercise that strengthens your muscles (resistance exercise) at least 3 days a week. This may include lifting weights or doing Pilates. Do not smoke or use any products that contain nicotine or tobacco. If you need help quitting, ask your doctor. Check your blood pressure at home as told by your doctor. Keep all follow-up visits. Medicines Take over-the-counter and prescription medicines  only as told by your doctor. Follow directions carefully. Do not skip doses of blood pressure medicine. The medicine does not work as well if you skip doses. Skipping doses also puts you at risk for problems. Ask your doctor about side effects or reactions to medicines that you should watch  for. Contact a doctor if: You think you are having a reaction to the medicine you are taking. You have headaches that keep coming back. You feel dizzy. You have swelling in your ankles. You have trouble with your vision. Get help right away if: You get a very bad headache. You start to feel mixed up (confused). You feel weak or numb. You feel faint. You have very bad pain in your: Chest. Belly (abdomen). You vomit more than once. You have trouble breathing. These symptoms may be an emergency. Get help right away. Call 911. Do not wait to see if the symptoms will go away. Do not drive yourself to the hospital. Summary Hypertension is another name for high blood pressure. High blood pressure forces your heart to work harder to pump blood. For most people, a normal blood pressure is less than 120/80. Making healthy choices can help lower blood pressure. If your blood pressure does not get lower with healthy choices, you may need to take medicine. This information is not intended to replace advice given to you by your health care provider. Make sure you discuss any questions you have with your health care provider. Document Revised: 08/28/2021 Document Reviewed: 08/28/2021 Elsevier Patient Education  2024 [employer].

## 2024-01-03 NOTE — Progress Notes (Signed)
 I,Victoria T Basil Lim, CMA,acting as a Neurosurgeon for Smiley Dung, MD.,have documented all relevant documentation on the behalf of Smiley Dung, MD,as directed by  Smiley Dung, MD while in the presence of Smiley Dung, MD.  Subjective:  Patient ID: Candice Ayala , female    DOB: [birth year] , 68 y.o.   MRN: [id number]  Chief Complaint  Patient presents with   Hypertension   Prediabetes    HPI  Patient presents today for bp & prediabetes. She reports compliance with medications.  Denies headache, chest pain, and SOB. She denies having any specific questions or concerns.     Hypertension This is a chronic problem. The current episode started more than 1 year ago. The problem is unchanged. The problem is controlled. Pertinent negatives include no anxiety, blurred vision, chest pain, malaise/fatigue, palpitations or peripheral edema. There are no associated agents to hypertension. Risk factors for coronary artery disease include obesity and sedentary lifestyle. Past treatments include angiotensin blockers and diuretics. There are no compliance problems.  There is no history of angina or kidney disease. There is no history of chronic renal disease.     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 >56   Early death Father    Hypertension Brother    Congestive Heart Failure Brother    Breast cancer Maternal Grandmother        dx. >50     Current Outpatient Medications:    diphenhydrAMINE  (BENADRYL ) 25 MG tablet, Take 25 mg by mouth every 8 (eight) hours as needed for allergies., Disp: , Rfl:    ibuprofen  (ADVIL ) 200 MG tablet, Take 200 mg by mouth every 6 (six) hours as needed for headache or moderate pain., Disp: ,  Rfl:    loratadine (CLARITIN) 10 MG tablet, Take 10 mg by mouth daily as needed for allergies., Disp: , Rfl:    Multiple Vitamins-Minerals (MULTIVITAMIN WITH MINERALS) tablet, Take 1 tablet by mouth daily., Disp: , Rfl:    valsartan -hydrochlorothiazide  (DIOVAN -HCT) 320-25 MG tablet, Take 1 tablet by mouth daily., Disp: 90 tablet, Rfl: 2   amLODipine  (NORVASC ) 5 MG tablet, Take 1 tablet (5 mg total) by mouth daily., Disp: 90 tablet, Rfl: 2   Allergies  Allergen Reactions   Contrast Media [Iodinated Contrast Media] Hives     Review of Systems  Constitutional: Negative.  Negative for malaise/fatigue.  Eyes:  Negative for blurred vision.  Respiratory: Negative.    Cardiovascular: Negative.  Negative for chest pain and palpitations.  Gastrointestinal: Negative.   Neurological: Negative.   Psychiatric/Behavioral: Negative.       Today's Vitals   01/03/24 0842 01/03/24 0908  BP: (!) 142/96 (!) 160/100  Pulse: 80   Temp: 98.5 F (36.9 C)   SpO2: 98%   Weight: 212 lb 12.8 oz (96.5 kg)   Height: 5\' 5"  (1.651 m)    Body mass index is 35.41 kg/m.  Wt Readings from Last 3 Encounters:  01/03/24 212 lb 12.8 oz (96.5 kg)  10/28/23 213 lb (96.6 kg)  10/06/23 213 lb 9.6 oz (96.9 kg)    BP Readings from Last 3  Encounters:  01/03/24 (!) 160/100  10/28/23 122/80  10/06/23 (!) 185/98     Objective:  Physical Exam Vitals and nursing note reviewed.  Constitutional:      Appearance: Normal appearance. [name]  HENT:     Head: Normocephalic and atraumatic.  Eyes:     Extraocular Movements: Extraocular movements intact.  Cardiovascular:     Rate and Rhythm: Normal rate and regular rhythm.     Heart sounds: Normal heart sounds.  Pulmonary:     Effort: Pulmonary effort is normal.     Breath sounds: Normal breath sounds.  Musculoskeletal:     Cervical back: Normal range of motion.  Skin:    General: Skin is warm.  Neurological:     General: No focal deficit present.      Mental Status: She is alert.  Psychiatric:        Mood and Affect: Mood normal.        Behavior: Behavior normal.         Assessment And Plan:  Essential hypertension Assessment & Plan: Chronic, uncontrolled due to non-compliance.  She admits she has not been taking amlodipine .  Importance of medication/dietary compliance was discussed with the patient.  I will restart amlodipine  5mg  (FULL TABLET) once daily.  She is advised to take this with her evening meal.  She will continue with valsartan /hct 320/25mg  daily for now. She is encouraged to decrease her sodium intake. She agrees to rto in two weeks for a NV. I will recheck her BP at that time. If needed, will increase amlodipine  to 10mg .  Importance of regular exercise was discussed with the patient as well.   Orders: -     amLODIPine  Besylate; Take 1 tablet (5 mg total) by mouth daily.  Dispense: 90 tablet; Refill: 2 -     BMP8+EGFR  Prediabetes Assessment & Plan: Previous labs reviewed, her A1c has been elevated in the past. I will check an A1c today. Reminded to avoid refined sugars including sugary drinks/foods and processed meats including bacon, sausages and deli meats.    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 Endo input appreciated. It is thought she has low risk of recurrence since lesion was less than 2cm. She is encouraged to keep regular f/u appts with Endo.    Multinodular goiter Assessment & Plan: Most recent thyroid  u/s reviewed. She did have bx of one nodule, this was neg for malignancy. Endo recommended hemithyroidectomy, she prefers to continue with yearly u/s surveillance.    Class 2 severe obesity due to excess calories with serious comorbidity and body mass index (BMI) of 35.0 to 35.9 in adult [facility]) Assessment & Plan: She is encouraged to increase her exercise to 30 minutes five days weekly to  help her achieve BMI<30 to decrease cardiac risk.     She is encouraged to strive for BMI less than 30 to decrease cardiac risk. Advised to aim for at least 150 minutes of exercise per week.    Return in about 2 weeks (around 01/17/2024), or NV - bp check, for 4 month bp check, cancel May appt with RS, AWV by phone.  Patient was given opportunity to ask questions. Patient verbalized understanding of the plan and was able to repeat key elements of the plan. All questions were answered to their satisfaction.    I, Bobetta Burrows  Elnita Hai, MD, have reviewed all documentation for this visit. The documentation on 01/03/24 for the exam, diagnosis, procedures, and orders are all accurate and complete.   IF YOU HAVE BEEN REFERRED TO A SPECIALIST, IT MAY TAKE 1-2 WEEKS TO SCHEDULE/PROCESS THE REFERRAL. IF YOU HAVE NOT HEARD FROM US /SPECIALIST IN TWO WEEKS, PLEASE GIVE US  A CALL AT [phone] X [phone].   THE PATIENT IS ENCOURAGED TO PRACTICE SOCIAL DISTANCING DUE TO THE COVID-19 PANDEMIC.

## 2024-01-03 NOTE — Assessment & Plan Note (Signed)
 Chronic, uncontrolled due to non-compliance.  She admits she has not been taking amlodipine .  Importance of medication/dietary compliance was discussed with the patient.  I will restart amlodipine  5mg  (FULL TABLET) once daily.  She is advised to take this with her evening meal.  She will continue with valsartan /hct 320/25mg  daily for now. She is encouraged to decrease her sodium intake. She agrees to rto in two weeks for a NV. I will recheck her BP at that time. If needed, will increase amlodipine  to 10mg .  Importance of regular exercise was discussed with the patient as well.

## 2024-01-03 NOTE — Assessment & Plan Note (Signed)
 Most recent thyroid  u/s reviewed. She did have bx of one nodule, this was neg for malignancy. Endo recommended hemithyroidectomy, she prefers to continue with yearly u/s surveillance.

## 2024-01-03 NOTE — Assessment & Plan Note (Signed)
 She is s/p oophorectomy and salpingectomy. Endo input appreciated. It is thought she has low risk of recurrence since lesion was less than 2cm. She is encouraged to keep regular f/u appts with Endo.

## 2024-01-03 NOTE — Assessment & Plan Note (Signed)
 Previous labs reviewed, her A1c has been elevated in the past. I will check an A1c today. Reminded to avoid refined sugars including sugary drinks/foods and processed meats including bacon, sausages and deli meats.

## 2024-01-18 ENCOUNTER — Ambulatory Visit: Payer: Medicare Other

## 2024-01-18 VITALS — BP 130/80 | HR 73 | Temp 98.1°F | Ht 65.0 in | Wt 212.0 lb

## 2024-01-18 DIAGNOSIS — I10 Essential (primary) hypertension: Secondary | ICD-10-CM

## 2024-01-18 NOTE — Progress Notes (Signed)
 Patient presents today for a bp check. Patient reports compliance with her meds/ patient reports she takes valsartan hydrochlorothiazide 320-25mg  in the mornings and amlodipine 5mg  in the evenings. I checked patient's blood pressure and it is 130/80 P73. Patient was adivsed goal bp is less than 120/80. She was advised to continue with current medications. She was also advised to diet and exercise. Patient will follow up with provider in 3 months for bpc. YL,RMA    BP Readings from Last 3 Encounters:  01/03/24 (!) 160/100  10/28/23 122/80  10/06/23 (!) 185/98

## 2024-01-18 NOTE — Patient Instructions (Signed)
 Hypertension, Adult Hypertension is another name for high blood pressure. High blood pressure forces your heart to work harder to pump blood. This can cause problems over time. There are two numbers in a blood pressure reading. There is a top number (systolic) over a bottom number (diastolic). It is best to have a blood pressure that is below 120/80. What are the causes? The cause of this condition is not known. Some other conditions can lead to high blood pressure. What increases the risk? Some lifestyle factors can make you more likely to develop high blood pressure: Smoking. Not getting enough exercise or physical activity. Being overweight. Having too much fat, sugar, calories, or salt (sodium) in your diet. Drinking too much alcohol. Other risk factors include: Having any of these conditions: Heart disease. Diabetes. High cholesterol. Kidney disease. Obstructive sleep apnea. Having a family history of high blood pressure and high cholesterol. Age. The risk increases with age. Stress. What are the signs or symptoms? High blood pressure may not cause symptoms. Very high blood pressure (hypertensive crisis) may cause: Headache. Fast or uneven heartbeats (palpitations). Shortness of breath. Nosebleed. Vomiting or feeling like you may vomit (nauseous). Changes in how you see. Very bad chest pain. Feeling dizzy. Seizures. How is this treated? This condition is treated by making healthy lifestyle changes, such as: Eating healthy foods. Exercising more. Drinking less alcohol. Your doctor may prescribe medicine if lifestyle changes do not help enough and if: Your top number is above 130. Your bottom number is above 80. Your personal target blood pressure may vary. Follow these instructions at home: Eating and drinking  If told, follow the DASH eating plan. To follow this plan: Fill one half of your plate at each meal with fruits and vegetables. Fill one fourth of your plate  at each meal with whole grains. Whole grains include whole-wheat pasta, brown rice, and whole-grain bread. Eat or drink low-fat dairy products, such as skim milk or low-fat yogurt. Fill one fourth of your plate at each meal with low-fat (lean) proteins. Low-fat proteins include fish, chicken without skin, eggs, beans, and tofu. Avoid fatty meat, cured and processed meat, or chicken with skin. Avoid pre-made or processed food. Limit the amount of salt in your diet to less than 1,500 mg each day. Do not drink alcohol if: Your doctor tells you not to drink. You are pregnant, may be pregnant, or are planning to become pregnant. If you drink alcohol: Limit how much you have to: 0-1 drink a day for women. 0-2 drinks a day for men. Know how much alcohol is in your drink. In the [place] one drink equals one 12 oz bottle of beer (355 mL), one 5 oz glass of wine (148 mL), or one 1 oz glass of hard liquor (44 mL). Lifestyle  Work with your doctor to stay at a healthy weight or to lose weight. Ask your doctor what the best weight is for you. Get at least 30 minutes of exercise that causes your heart to beat faster (aerobic exercise) most days of the week. This may include walking, swimming, or biking. Get at least 30 minutes of exercise that strengthens your muscles (resistance exercise) at least 3 days a week. This may include lifting weights or doing Pilates. Do not smoke or use any products that contain nicotine or tobacco. If you need help quitting, ask your doctor. Check your blood pressure at home as told by your doctor. Keep all follow-up visits. Medicines Take over-the-counter and prescription medicines  only as told by your doctor. Follow directions carefully. Do not skip doses of blood pressure medicine. The medicine does not work as well if you skip doses. Skipping doses also puts you at risk for problems. Ask your doctor about side effects or reactions to medicines that you should watch  for. Contact a doctor if: You think you are having a reaction to the medicine you are taking. You have headaches that keep coming back. You feel dizzy. You have swelling in your ankles. You have trouble with your vision. Get help right away if: You get a very bad headache. You start to feel mixed up (confused). You feel weak or numb. You feel faint. You have very bad pain in your: Chest. Belly (abdomen). You vomit more than once. You have trouble breathing. These symptoms may be an emergency. Get help right away. Call 911. Do not wait to see if the symptoms will go away. Do not drive yourself to the hospital. Summary Hypertension is another name for high blood pressure. High blood pressure forces your heart to work harder to pump blood. For most people, a normal blood pressure is less than 120/80. Making healthy choices can help lower blood pressure. If your blood pressure does not get lower with healthy choices, you may need to take medicine. This information is not intended to replace advice given to you by your health care provider. Make sure you discuss any questions you have with your health care provider. Document Revised: 08/28/2021 Document Reviewed: 08/28/2021 Elsevier Patient Education  2024 [employer].

## 2024-02-07 ENCOUNTER — Encounter: Payer: Self-pay | Admitting: Hematology

## 2024-02-24 ENCOUNTER — Other Ambulatory Visit: Payer: Self-pay | Admitting: Internal Medicine

## 2024-02-24 DIAGNOSIS — I10 Essential (primary) hypertension: Secondary | ICD-10-CM

## 2024-03-03 ENCOUNTER — Encounter: Payer: Self-pay | Admitting: Internal Medicine

## 2024-03-03 ENCOUNTER — Ambulatory Visit (INDEPENDENT_AMBULATORY_CARE_PROVIDER_SITE_OTHER): Payer: Medicare Other | Admitting: Internal Medicine

## 2024-03-03 VITALS — BP 124/86 | HR 88 | Ht 65.0 in | Wt 215.0 lb

## 2024-03-03 DIAGNOSIS — C562 Malignant neoplasm of left ovary: Secondary | ICD-10-CM

## 2024-03-03 DIAGNOSIS — Z85850 Personal history of malignant neoplasm of thyroid: Secondary | ICD-10-CM

## 2024-03-03 DIAGNOSIS — E042 Nontoxic multinodular goiter: Secondary | ICD-10-CM

## 2024-03-03 NOTE — Progress Notes (Signed)
 Name: Candice Ayala  MRN/ DOB: [id number], 1956/02/15    Age/ Sex: 68 y.o., female    PCP: Cleave Curling, MD   Reason for Endocrinology Evaluation:      Date of Initial Endocrinology Evaluation: 09/03/2023    HPI: [name] is a 68 y.o. 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 The patient presented for initial endocrinology clinic visit on 09/03/2023 for consultative assistance with her history of PTC.   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  Her tumor was determined to be low risk based on classic papillary architecture, no extraovarian extension and tumor <4 cm.  BRAF testing was negative    Thyroid ultrasound 10/2020 revealed a right mid 3.5 cm nodule meeting FNA criteria as well as subcentimeter nodules on the left. She is s/p FNA of the right mid nodule 11/06/2020 with scant cellularity, but it was read as benign.   Repeat FNA 09/2023 continued to show insufficient cellularity   She was diagnosed with right breast DCIS in 2023, treated with lumpectomy and adjuvant radiation therapy.     NO FH of thyroid disease Mother with hx colon cancer   SUBJECTIVE:    Today (03/03/24):  Candice Ayala is here for follow-up on multinodular goiter  Denies local neck swelling  Denies palpitations  Denies  tremors Denies diarrhea or loose stools or constipation  Has mild anxiety due to losing her job     HISTORY:  Past Medical History:  Past Medical History:  Diagnosis Date   Allergy    Arthritis    Breast cancer (HCC)    Hypertension    Malignant struma ovarii of left ovary (HCC) 10/22/2020   PONV (postoperative nausea and vomiting)    Pre-diabetes    Past Surgical History:  Past Surgical History:  Procedure  Laterality Date   BREAST BIOPSY Right 11/27/2013   Procedure: RIGHT BREAST BIOPSY WITH NEEDLE LOCALIZATION;  Surgeon: Harlee Lichtenstein, MD;  Location: [facility];  Service: General;  Laterality: Right;   BREAST LUMPECTOMY WITH RADIOACTIVE SEED LOCALIZATION Right 12/03/2022   Procedure: RIGHT BREAST LUMPECTOMY WITH RADIOACTIVE SEED LOCALIZATION X2;  Surgeon: Oza Blumenthal, MD;  Location: [facility];  Service: General;  Laterality: Right;   COLONOSCOPY     JOINT REPLACEMENT  2004   right total hip   LYSIS OF ADHESION N/A 10/01/2020   Procedure: LYSIS OF ADHESION;  Surgeon: Alphonso Aschoff, MD;  Location: [facility];  Service: Gynecology;  Laterality: N/A;   OVARIAN CYST REMOVAL  1980   ROBOTIC ASSISTED TOTAL HYSTERECTOMY WITH BILATERAL SALPINGO OOPHERECTOMY N/A 10/01/2020   Procedure: XI ROBOTIC ASSISTED TOTAL HYSTERECTOMY WITH UNILATERL  SALPINGO OOPHORECTOMY;  Surgeon: Alphonso Aschoff, MD;  Location: [facility];  Service: Gynecology;  Laterality: N/A;   TOTAL HIP ARTHROPLASTY Right 2004   TOTAL HIP ARTHROPLASTY Left 02/12/2022    Social History:  reports that she has never smoked. She has never used smokeless tobacco. She reports that she does not drink alcohol and does not use drugs. Family History: family history includes Breast cancer in her maternal grandmother; Colon cancer in her mother; Congestive Heart Failure in her brother; Early death in her father; Hypertension in her brother.   HOME MEDICATIONS: Allergies as of 03/03/2024  Reactions   Contrast Media [iodinated Contrast Media] Hives        Medication List        Accurate as of March 03, 2024  1:04 PM. If you have any questions, ask your nurse or doctor.          amLODipine 5 MG tablet Commonly known as: NORVASC Take 1 tablet (5 mg total) by mouth daily.   diphenhydrAMINE 25 MG tablet Commonly known as: BENADRYL Take 25 mg by mouth every 8 (eight) hours as needed for allergies.   ibuprofen 200 MG  tablet Commonly known as: ADVIL Take 200 mg by mouth every 6 (six) hours as needed for headache or moderate pain.   loratadine 10 MG tablet Commonly known as: CLARITIN Take 10 mg by mouth daily as needed for allergies.   multivitamin with minerals tablet Take 1 tablet by mouth daily.   valsartan-hydrochlorothiazide 320-25 MG tablet Commonly known as: DIOVAN-HCT TAKE 1 TABLET BY MOUTH EVERY DAY          REVIEW OF SYSTEMS: A comprehensive ROS was conducted with the patient and is negative except as per HPI    OBJECTIVE:  VS: BP 124/86 (BP Location: Left Arm, Patient Position: Sitting, Cuff Size: Small)   Pulse 88   Ht 5\' 5"  (1.651 m)   Wt 215 lb (97.5 kg)   SpO2 97%   BMI 35.78 kg/m    Wt Readings from Last 3 Encounters:  03/03/24 215 lb (97.5 kg)  01/18/24 212 lb (96.2 kg)  01/03/24 212 lb 12.8 oz (96.5 kg)     EXAM: General: Pt appears well and is in NAD  Neck: General: Supple without adenopathy. Thyroid: Right thyroid nodule appreciated.   Lungs: Clear with good BS bilat   Heart: Auscultation: RRR.  Extremities:  BL LE: No pretibial edema   Mental Status: Judgment, insight: Intact Orientation: Oriented to time, place, and person Mood and affect: No depression, anxiety, or agitation     DATA REVIEWED:   Latest Reference Range & Units 03/03/24 13:24  TSH 0.40 - 4.50 mIU/L 1.23  Triiodothyronine,Free,Serum 2.3 - 4.2 pg/mL 3.2  T4,Free(Direct) 0.8 - 1.8 ng/dL 1.1      Latest Reference Range & Units 09/03/23 13:43  TSH 0.35 - 5.50 uIU/mL 1.21  Triiodothyronine,Free,Serum 2.3 - 4.2 pg/mL 3.4  T4,Free(Direct) 0.60 - 1.60 ng/dL 8.65  Thyroglobulin ng/mL 54.6 (H)  Thyroglobulin Ab < or = 1 IU/mL <1     Thyroid ultrasound 09/08/2023  Estimated total number of nodules >/= 1 cm: 1   Number of spongiform nodules >/=  2 cm not described below (TR1): 0   Number of mixed cystic and solid nodules >/= 1.5 cm not described below (TR2): 0    _________________________________________________________   Nodule labeled 1 in the mid right thyroid lobe is a previously biopsied solid nodule measuring up to 3.5 cm, previously 3.5 cm when measured in a similar fashion. The nodule overall remains similar in size and morphology.   A couple of additional small subcentimeter nodule in the left thyroid lobe do not meet criteria for further dedicated follow-up or biopsy, as previously noted.   IMPRESSION: 1. Multinodular thyroid gland.  No new or enlarging thyroid nodules. 2. Similar appearance of previously biopsied nodule in the right thyroid lobe. Correlate with biopsy results.         FNA right mid nodule 10/18/2023   Clinical History: Nodule labeled 1 in the mid right thyroid lobe is a  previously  biopsied solid nodule measuring up to 3.5cm, previuosly 3.5cm  when measured in a similar fashion, The nodule overall remains similar  in size and morphology  Specimen Submitted:  A. THYROID, RIGHT MID, FINE NEEDLE ASPIRATION    FINAL MICROSCOPIC DIAGNOSIS:  - Scant follicular epithelium present (Bethesda category I)    FNA right mid nodule 11/06/2020  Clinical History: 3.5 cm x 1.8 cm x 2.4 cm RMP  Specimen Submitted:  A. THYROID, RMP, FINE NEEDLE ASPIRATION:    FINAL MICROSCOPIC DIAGNOSIS:  - Consistent with benign follicular nodule (Bethesda category II)   SPECIMEN ADEQUACY:  Satisfactory but limited for evaluation, scant cellularity    Pathology report 10/01/2020  FINAL MICROSCOPIC DIAGNOSIS:  A. FALLOPIAN TUBE AND OVARY, LEFT, SALPINGO-OOPHORECTOMY: Ovary: - Stroma ovarii with papillary thyroid carcinoma.  See oncology table.  Fallopian tube: - No significant histopathologic findings.  B. UTERUS AND CERVIX, HYSTERECTOMY: Cervix: - Mild acute and chronic cervicitis.  Uterus: - Endometrium: Endometrial polyp. - Myometrium: Leiomyomata. - Serosa: No significant histopathologic findings.  ONCOLOGY  TABLE:  OVARY or FALLOPIAN TUBE or PRIMARY PERITONEUM:  Procedure: Hysterectomy and left salpingo-oophorectomy Specimen Integrity: Ovary: Capsule disrupted Tumor Site: Left ovary Tumor Size: Greatest dimension: 9 cm (See comment) Ovarian Surface Involvement: Not identified Histologic Type: Struma ovarii with papillary thyroid carcinoma Other Tissue/Organ Involvement: Not identified Peritoneal/Ascitic Fluid: Benign Regional Lymph Nodes: Not applicable (no regional lymph nodes submitted or found) Pathologic Stage Classification (pTNM, AJCC 8th Edition): pT1a, pN not assigned Representative Tumor Block: A6 Comment: TTF-1 and Thyroglobulin are positive in papillary thyroid carcinoma.  Calretinin, Inhibin and WT-1 are negative.  Overall, the ovarian mass measures 9 cm in greatest dimension, and the papillary thyroid carcinoma component measures 1.8 cm in greatest dimension. Results reported to [facility] on 10/04/2000.  Dr. Secundino Dach reviewed select slides. (v1.1.1.0)   Old records , labs and images have been reviewed.     ASSESSMENT/PLAN/RECOMMENDATIONS:   Multinodular goiter:  -No local neck symptoms -Patient is clinically euthyroid -She is s/p FNA of the right mid nodule in 2021 with  SCANT CELLULARITY but somehow this was read as benign ????? -Repeat FNA of the right nodule 09/2023 revealed scant cellularity (Bethesda category I) I did recommend hemithyroidectomy, but the patient would like to avoid any surgical intervention especially with the size of her thyroid nodule stable at 3.5 cm over the past 2 years -We opted to monitor with annual ultrasound  -Will order an ultrasound on next visit    2. Malignant Thyroid Type- Papillary Neoplasm in Struma Ovarii :  -This has been thought to be low risk with a PTC size of 1.8 cm, no metastases -S/p resection of the left ovary and fallopian tube as well as hysterectomy -Due to the rarity  of malignant struma ovarii and it is  available literature data, no formal standard criteria have been established regarding his diagnosis, management and follow-up. -Tumor size of 2 cm or less is associated with a low chance of recurrence. -Thyroglobulin elevated, with undetectable thyroglobulin antibodies , we will use this as a baseline for future monitoring  Follow-up in 6 months  Signed electronically by: Natale Bail, MD  [facility] Endocrinology  [facility] [address] [place] [place], [place] [address] Phone: [phone] FAX: [phone]   CC: Cleave Curling, MD [address] [place] [address] Phone: [phone] Fax: [phone]   Return to Endocrinology clinic as below: Future Appointments  Date Time Provider Department Center  03/29/2024 10:00  AM [facility]-ANNUAL WELLNESS VISIT [facility] None  05/02/2024  8:40 AM Cleave Curling, MD [facility] None  05/16/2024  8:20 AM [facility]-CLINICAL SUPPORT [facility] None

## 2024-03-04 LAB — TSH: TSH: 1.23 m[IU]/L (ref 0.40–4.50)

## 2024-03-04 LAB — T4, FREE: Free T4: 1.1 ng/dL (ref 0.8–1.8)

## 2024-03-04 LAB — T3, FREE: T3, Free: 3.2 pg/mL (ref 2.3–4.2)

## 2024-03-06 ENCOUNTER — Encounter: Payer: Self-pay | Admitting: Internal Medicine

## 2024-03-21 ENCOUNTER — Other Ambulatory Visit: Payer: Self-pay

## 2024-03-29 ENCOUNTER — Ambulatory Visit: Payer: Self-pay | Admitting: Internal Medicine

## 2024-03-29 ENCOUNTER — Ambulatory Visit: Payer: Medicare Other

## 2024-03-29 DIAGNOSIS — Z0000 Encounter for general adult medical examination without abnormal findings: Secondary | ICD-10-CM

## 2024-03-29 NOTE — Progress Notes (Signed)
 Subjective:   Candice Ayala is a 68 y.o. who presents for a Medicare Wellness preventive visit.  Visit Complete: Virtual I connected with  Candice Ayala on 03/29/24 by a audio enabled telemedicine application and verified that I am speaking with the correct person using two identifiers.  Patient Location: Home  Provider Location: Office/Clinic  I discussed the limitations of evaluation and management by telemedicine. The patient expressed understanding and agreed to proceed.  Vital Signs: Because this visit was a virtual/telehealth visit, some criteria may be missing or patient reported. Any vitals not documented were not able to be obtained and vitals that have been documented are patient reported.  VideoError- Librarian, academic were attempted between this provider and patient, however failed, due to patient having technical difficulties OR patient did not have access to video capability.  We continued and completed visit with audio only.   Persons Participating in Visit: Patient.  AWV Questionnaire: No: Patient Medicare AWV questionnaire was not completed prior to this visit.  Cardiac Risk Factors include: advanced age (>65men, >55 women)     Objective:    Today's Vitals   There is no height or weight on file to calculate BMI.     03/29/2024   10:06 AM 03/17/2023   11:20 AM 12/31/2022    7:25 AM 12/03/2022    9:48 AM 12/01/2022    8:26 AM 03/11/2022    2:47 PM 02/26/2021   11:34 AM  Advanced Directives  Does Patient Have a Medical Advance Directive? No No No No No No No  Would patient like information on creating a medical advance directive? No - Patient declined  Yes (ED - Information included in AVS) No - Patient declined No - Patient declined  No - Patient declined    Current Medications (verified) Outpatient Encounter Medications as of 03/29/2024  Medication Sig   amLODipine  (NORVASC ) 5 MG tablet Take 1 tablet (5 mg total) by mouth daily.    diphenhydrAMINE  (BENADRYL ) 25 MG tablet Take 25 mg by mouth every 8 (eight) hours as needed for allergies.   ibuprofen  (ADVIL ) 200 MG tablet Take 200 mg by mouth every 6 (six) hours as needed for headache or moderate pain.   loratadine (CLARITIN) 10 MG tablet Take 10 mg by mouth daily as needed for allergies.   Multiple Vitamins-Minerals (MULTIVITAMIN WITH MINERALS) tablet Take 1 tablet by mouth daily.   valsartan -hydrochlorothiazide  (DIOVAN -HCT) 320-25 MG tablet TAKE 1 TABLET BY MOUTH EVERY DAY   [DISCONTINUED] fluticasone  (FLONASE ) 50 MCG/ACT nasal spray SPRAY 2 SPRAYS INTO EACH NOSTRIL EVERY DAY (Patient taking differently: Place 2 sprays into both nostrils daily as needed for allergies.)   No facility-administered encounter medications on file as of 03/29/2024.    Allergies (verified) Contrast media [iodinated contrast media]   History: Past Medical History:  Diagnosis Date   Allergy    Arthritis    Breast cancer (HCC)    Hypertension    Malignant struma ovarii of left ovary (HCC) 10/22/2020   PONV (postoperative nausea and vomiting)    Pre-diabetes    Past Surgical History:  Procedure Laterality Date   BREAST BIOPSY Right 11/27/2013   Procedure: RIGHT BREAST BIOPSY WITH NEEDLE LOCALIZATION;  Surgeon: Harlee Lichtenstein, MD;  Location: [facility];  Service: General;  Laterality: Right;   BREAST LUMPECTOMY WITH RADIOACTIVE SEED LOCALIZATION Right 12/03/2022   Procedure: RIGHT BREAST LUMPECTOMY WITH RADIOACTIVE SEED LOCALIZATION X2;  Surgeon: Oza Blumenthal, MD;  Location: [facility];  Service: General;  Laterality: Right;   COLONOSCOPY     JOINT REPLACEMENT  2004   right total hip   LYSIS OF ADHESION N/A 10/01/2020   Procedure: LYSIS OF ADHESION;  Surgeon: Alphonso Aschoff, MD;  Location: [facility];  Service: Gynecology;  Laterality: N/A;   OVARIAN CYST REMOVAL  1980   ROBOTIC ASSISTED TOTAL HYSTERECTOMY WITH BILATERAL SALPINGO OOPHERECTOMY N/A 10/01/2020   Procedure: XI  ROBOTIC ASSISTED TOTAL HYSTERECTOMY WITH UNILATERL  SALPINGO OOPHORECTOMY;  Surgeon: Alphonso Aschoff, MD;  Location: [facility];  Service: Gynecology;  Laterality: N/A;   TOTAL HIP ARTHROPLASTY Right 2004   TOTAL HIP ARTHROPLASTY Left 02/12/2022   Family History  Problem Relation Age of Onset   Colon cancer Mother        dx. >34   Early death Father    Hypertension Brother    Congestive Heart Failure Brother    Breast cancer Maternal Grandmother        dx. >50   Social History   Socioeconomic History   Marital status: Divorced    Spouse name: Not on file   Number of children: 0   Years of education: Not on file   Highest education level: Not on file  Occupational History   Not on file  Tobacco Use   Smoking status: Never   Smokeless tobacco: Never   Tobacco comments:    She is a non-smoker  Vaping Use   Vaping status: Never Used  Substance and Sexual Activity   Alcohol use: No   Drug use: No   Sexual activity: Not Currently  Other Topics Concern   Not on file  Social History Narrative   Not on file   Social Drivers of Health   Financial Resource Strain: Low Risk  (03/29/2024)   Overall Financial Resource Strain (CARDIA)    Difficulty of Paying Living Expenses: Not hard at all  Food Insecurity: No Food Insecurity (03/29/2024)   Hunger Vital Sign    Worried About Running Out of Food in the Last Year: Never true    Ran Out of Food in the Last Year: Never true  Transportation Needs: No Transportation Needs (03/29/2024)   PRAPARE - Administrator, Civil Service (Medical): No    Lack of Transportation (Non-Medical): No  Physical Activity: Inactive (03/29/2024)   Exercise Vital Sign    Days of Exercise per Week: 0 days    Minutes of Exercise per Session: 0 min  Stress: No Stress Concern Present (03/29/2024)   [employer] of Occupational Health - Occupational Stress Questionnaire    Feeling of Stress : Not at all  Social Connections: Moderately Isolated (03/29/2024)    Social Connection and Isolation Panel [NHANES]    Frequency of Communication with Friends and Family: More than three times a week    Frequency of Social Gatherings with Friends and Family: Three times a week    Attends Religious Services: More than 4 times per year    Active Member of Clubs or Organizations: No    Attends Banker Meetings: Never    Marital Status: Divorced    Tobacco Counseling Counseling given: Not Answered Tobacco comments: She is a non-smoker    Clinical Intake:  Pre-visit preparation completed: Yes  Pain : No/denies pain     Nutritional Risks: None Diabetes: No  Lab Results  Component Value Date   HGBA1C 6.4 (H) 01/03/2024   HGBA1C 6.4 (H) 07/27/2023   HGBA1C 6.3 (H) 03/17/2023  How often do you need to have someone help you when you read instructions, pamphlets, or other written materials from your doctor or pharmacy?: 1 - Never  Interpreter Needed?: No  Information entered by :: NAllen LPN   Activities of Daily Living     03/29/2024   10:02 AM  In your present state of health, do you have any difficulty performing the following activities:  Hearing? 0  Vision? 0  Difficulty concentrating or making decisions? 0  Walking or climbing stairs? 0  Dressing or bathing? 0  Doing errands, shopping? 0  Preparing Food and eating ? N  Using the Toilet? N  In the past six months, have you accidently leaked urine? N  Do you have problems with loss of bowel control? N  Managing your Medications? N  Managing your Finances? N  Housekeeping or managing your Housekeeping? N    Patient Care Team: Cleave Curling, MD as PCP - General (Internal Medicine) Oza Blumenthal, MD as Consulting Physician (General Surgery) Sonja [place], MD as Consulting Physician (Hematology) Johna Myers, MD as Consulting Physician (Radiation Oncology)  Indicate any recent Medical Services you may have received from other than [facility] providers in the past year  (date may be approximate).     Assessment:   This is a routine wellness examination for Candice Ayala.  Hearing/Vision screen Hearing Screening - Comments:: Denies hearing issues Vision Screening - Comments:: No regular eye exams   Goals Addressed             This Visit's Progress    Patient Stated       03/29/2024, wants to eat more fruits and veggies, walk more       Depression Screen     03/29/2024   10:07 AM 01/03/2024    8:44 AM 03/17/2023   11:21 AM 03/17/2023   10:51 AM 08/12/2022   12:05 PM 03/11/2022    2:48 PM 03/05/2022   11:09 AM  PHQ 2/9 Scores  PHQ - 2 Score 0 0 0 0 0 0 0  PHQ- 9 Score 0 0  0       Fall Risk     03/29/2024   10:06 AM 01/03/2024    8:43 AM 07/07/2023    3:34 PM 03/17/2023   11:21 AM 03/17/2023   10:51 AM  Fall Risk   Falls in the past year? 0 0 0 0 0  Number falls in past yr: 0 0 0 0 0  Injury with Fall? 0 0 0 0 0  Risk for fall due to : Medication side effect No Fall Risks No Fall Risks Medication side effect No Fall Risks  Follow up Falls prevention discussed;Falls evaluation completed Falls evaluation completed Falls evaluation completed Falls prevention discussed;Education provided;Falls evaluation completed Falls evaluation completed    MEDICARE RISK AT HOME:  Medicare Risk at Home Any stairs in or around the home?: No If so, are there any without handrails?: No Home free of loose throw rugs in walkways, pet beds, electrical cords, etc?: Yes Adequate lighting in your home to reduce risk of falls?: Yes Life alert?: No Use of a cane, walker or w/c?: No Grab bars in the bathroom?: Yes Shower chair or bench in shower?: No Elevated toilet seat or a handicapped toilet?: Yes  TIMED UP AND GO:  Was the test performed?  No  Cognitive Function: 6CIT completed        03/29/2024   10:08 AM 03/17/2023   11:22 AM 03/11/2022  2:49 PM 02/26/2021   11:36 AM 12/19/2019   10:28 AM  6CIT Screen  What Year? 0 points 0 points 0 points 0 points 0  points  What month? 0 points 0 points 0 points 0 points 0 points  What time? 0 points 0 points 0 points 0 points 0 points  Count back from 20 0 points 0 points 0 points 0 points 0 points  Months in reverse 0 points 0 points 0 points 0 points 0 points  Repeat phrase 0 points 0 points 0 points 0 points 0 points  Total Score 0 points 0 points 0 points 0 points 0 points    Immunizations Immunization History  Administered Date(s) Administered   Fluad Quad(high Dose 65+) 07/31/2020, 08/08/2022   Influenza,inj,Quad PF,6+ Mos 10/03/2018   Influenza-Unspecified 08/22/2019, 08/08/2022, 09/01/2023   PFIZER(Purple Top)SARS-COV-2 Vaccination 12/14/2019, 01/04/2020, 08/30/2020   PNEUMOCOCCAL CONJUGATE-20 08/22/2020, 08/22/2021   Pfizer Covid-19 Vaccine Bivalent Booster 5y-11y 08/22/2021   Pneumococcal Polysaccharide-23 08/22/2019   Tdap 01/05/2022    Screening Tests Health Maintenance  Topic Date Due   COVID-19 Vaccine (5 - 2024-25 season) 07/25/2023   Zoster Vaccines- Shingrix (1 of 2) 04/01/2024 (Originally 02/17/1975)   INFLUENZA VACCINE  06/23/2024   Medicare Annual Wellness (AWV)  03/29/2025   MAMMOGRAM  02/03/2026   Colonoscopy  09/04/2026   DTaP/Tdap/Td (2 - Td or Tdap) 01/06/2032   Pneumonia Vaccine 61+ Years old  Completed   DEXA SCAN  Completed   Hepatitis C Screening  Completed   HPV VACCINES  Aged Out   Meningococcal B Vaccine  Aged Out    Health Maintenance  Health Maintenance Due  Topic Date Due   COVID-19 Vaccine (5 - 2024-25 season) 07/25/2023   Health Maintenance Items Addressed: Declines covid vaccine.  Additional Screening:  Vision Screening: Recommended annual ophthalmology exams for early detection of glaucoma and other disorders of the eye.  Dental Screening: Recommended annual dental exams for proper oral hygiene  Community Resource Referral / Chronic Care Management: CRR required this visit?  No   CCM required this visit?  No     Plan:     I  have personally reviewed and noted the following in the patient's chart:   Medical and social history Use of alcohol, tobacco or illicit drugs  Current medications and supplements including opioid prescriptions. Patient is not currently taking opioid prescriptions. Functional ability and status Nutritional status Physical activity Advanced directives List of other physicians Hospitalizations, surgeries, and ER visits in previous 12 months Vitals Screenings to include cognitive, depression, and falls Referrals and appointments  In addition, I have reviewed and discussed with patient certain preventive protocols, quality metrics, and best practice recommendations. A written personalized care plan for preventive services as well as general preventive health recommendations were provided to patient.     Areatha Beecham, LPN   03/24/8412   After Visit Summary: (MyChart) Due to this being a telephonic visit, the after visit summary with patients personalized plan was offered to patient via MyChart   Notes: Nothing significant to report at this time.

## 2024-03-29 NOTE — Patient Instructions (Signed)
 Candice Ayala , Thank you for taking time to come for your Medicare Wellness Visit. I appreciate your ongoing commitment to your health goals. Please review the following plan we discussed and let me know if I can assist you in the future.   Referrals/Orders/Follow-Ups/Clinician Recommendations: none  This is a list of the screening recommended for you and due dates:  Health Maintenance  Topic Date Due   COVID-19 Vaccine (5 - 2024-25 season) 07/25/2023   Zoster (Shingles) Vaccine (1 of 2) 04/01/2024*   Flu Shot  06/23/2024   Medicare Annual Wellness Visit  03/29/2025   Mammogram  02/03/2026   Colon Cancer Screening  09/04/2026   DTaP/Tdap/Td vaccine (2 - Td or Tdap) 01/06/2032   Pneumonia Vaccine  Completed   DEXA scan (bone density measurement)  Completed   Hepatitis C Screening  Completed   HPV Vaccine  Aged Out   Meningitis B Vaccine  Aged Out  *Topic was postponed. The date shown is not the original due date.    Advanced directives: (Declined) Advance directive discussed with you today. Even though you declined this today, please call our office should you change your mind, and we can give you the proper paperwork for you to fill out.  Next Medicare Annual Wellness Visit scheduled for next year: No, office will schedule  Have you seen your provider in the last 6 months (3 months if uncontrolled diabetes)? Yes, has appointment 05/02/2024  insert Preventive Care attachment Insert FALL PREVENTION attachment if needed

## 2024-05-02 ENCOUNTER — Ambulatory Visit: Payer: Self-pay | Admitting: Internal Medicine

## 2024-05-10 ENCOUNTER — Ambulatory Visit: Payer: Self-pay | Admitting: Internal Medicine

## 2024-05-10 ENCOUNTER — Encounter: Payer: Self-pay | Admitting: Internal Medicine

## 2024-05-10 VITALS — BP 140/82 | HR 73 | Temp 97.9°F | Ht 65.0 in | Wt 214.8 lb

## 2024-05-10 DIAGNOSIS — R7303 Prediabetes: Secondary | ICD-10-CM

## 2024-05-10 DIAGNOSIS — I10 Essential (primary) hypertension: Secondary | ICD-10-CM | POA: Diagnosis not present

## 2024-05-10 DIAGNOSIS — Z6835 Body mass index (BMI) 35.0-35.9, adult: Secondary | ICD-10-CM

## 2024-05-10 DIAGNOSIS — E042 Nontoxic multinodular goiter: Secondary | ICD-10-CM | POA: Diagnosis not present

## 2024-05-10 DIAGNOSIS — E66812 Obesity, class 2: Secondary | ICD-10-CM

## 2024-05-10 DIAGNOSIS — C562 Malignant neoplasm of left ovary: Secondary | ICD-10-CM | POA: Diagnosis not present

## 2024-05-10 LAB — CMP14+EGFR
ALT: 10 IU/L (ref 0–32)
AST: 16 IU/L (ref 0–40)
Albumin: 4.5 g/dL (ref 3.9–4.9)
Alkaline Phosphatase: 127 IU/L — ABNORMAL HIGH (ref 44–121)
BUN/Creatinine Ratio: 16 (ref 12–28)
BUN: 12 mg/dL (ref 8–27)
Bilirubin Total: 0.4 mg/dL (ref 0.0–1.2)
CO2: 23 mmol/L (ref 20–29)
Calcium: 10.6 mg/dL — ABNORMAL HIGH (ref 8.7–10.3)
Chloride: 99 mmol/L (ref 96–106)
Creatinine, Ser: 0.75 mg/dL (ref 0.57–1.00)
Globulin, Total: 2.5 g/dL (ref 1.5–4.5)
Glucose: 90 mg/dL (ref 70–99)
Potassium: 3.9 mmol/L (ref 3.5–5.2)
Sodium: 139 mmol/L (ref 134–144)
Total Protein: 7 g/dL (ref 6.0–8.5)
eGFR: 86 mL/min/{1.73_m2} (ref >59)

## 2024-05-10 LAB — CBC
Hematocrit: 38.1 % (ref 34.0–46.6)
Hemoglobin: 12.3 g/dL (ref 11.1–15.9)
MCH: 28.9 pg (ref 26.6–33.0)
MCHC: 32.3 g/dL (ref 31.5–35.7)
MCV: 89 fL (ref 79–97)
Platelets: 336 10*3/uL (ref 150–450)
RBC: 4.26 x10E6/uL (ref 3.77–5.28)
RDW: 13.7 % (ref 11.7–15.4)
WBC: 4.6 10*3/uL (ref 3.4–10.8)

## 2024-05-10 LAB — LIPID PANEL
Chol/HDL Ratio: 3.2 ratio (ref 0.0–4.4)
Cholesterol, Total: 255 mg/dL — ABNORMAL HIGH (ref 100–199)
HDL: 79 mg/dL (ref >39)
LDL Chol Calc (NIH): 161 mg/dL — ABNORMAL HIGH (ref 0–99)
Triglycerides: 88 mg/dL (ref 0–149)
VLDL Cholesterol Cal: 15 mg/dL (ref 5–40)

## 2024-05-10 LAB — HEMOGLOBIN A1C
Est. average glucose Bld gHb Est-mCnc: 131 mg/dL
Hgb A1c MFr Bld: 6.2 % — ABNORMAL HIGH (ref 4.8–5.6)

## 2024-05-10 NOTE — Patient Instructions (Signed)
 Hypertension, Adult Hypertension is another name for high blood pressure. High blood pressure forces your heart to work harder to pump blood. This can cause problems over time. There are two numbers in a blood pressure reading. There is a top number (systolic) over a bottom number (diastolic). It is best to have a blood pressure that is below 120/80. What are the causes? The cause of this condition is not known. Some other conditions can lead to high blood pressure. What increases the risk? Some lifestyle factors can make you more likely to develop high blood pressure: Smoking. Not getting enough exercise or physical activity. Being overweight. Having too much fat, sugar, calories, or salt (sodium) in your diet. Drinking too much alcohol. Other risk factors include: Having any of these conditions: Heart disease. Diabetes. High cholesterol. Kidney disease. Obstructive sleep apnea. Having a family history of high blood pressure and high cholesterol. Age. The risk increases with age. Stress. What are the signs or symptoms? High blood pressure may not cause symptoms. Very high blood pressure (hypertensive crisis) may cause: Headache. Fast or uneven heartbeats (palpitations). Shortness of breath. Nosebleed. Vomiting or feeling like you may vomit (nauseous). Changes in how you see. Very bad chest pain. Feeling dizzy. Seizures. How is this treated? This condition is treated by making healthy lifestyle changes, such as: Eating healthy foods. Exercising more. Drinking less alcohol. Your doctor may prescribe medicine if lifestyle changes do not help enough and if: Your top number is above 130. Your bottom number is above 80. Your personal target blood pressure may vary. Follow these instructions at home: Eating and drinking  If told, follow the DASH eating plan. To follow this plan: Fill one half of your plate at each meal with fruits and vegetables. Fill one fourth of your plate  at each meal with whole grains. Whole grains include whole-wheat pasta, brown rice, and whole-grain bread. Eat or drink low-fat dairy products, such as skim milk or low-fat yogurt. Fill one fourth of your plate at each meal with low-fat (lean) proteins. Low-fat proteins include fish, chicken without skin, eggs, beans, and tofu. Avoid fatty meat, cured and processed meat, or chicken with skin. Avoid pre-made or processed food. Limit the amount of salt in your diet to less than 1,500 mg each day. Do not drink alcohol if: Your doctor tells you not to drink. You are pregnant, may be pregnant, or are planning to become pregnant. If you drink alcohol: Limit how much you have to: 0-1 drink a day for women. 0-2 drinks a day for men. Know how much alcohol is in your drink. In the [place] one drink equals one 12 oz bottle of beer (355 mL), one 5 oz glass of wine (148 mL), or one 1 oz glass of hard liquor (44 mL). Lifestyle  Work with your doctor to stay at a healthy weight or to lose weight. Ask your doctor what the best weight is for you. Get at least 30 minutes of exercise that causes your heart to beat faster (aerobic exercise) most days of the week. This may include walking, swimming, or biking. Get at least 30 minutes of exercise that strengthens your muscles (resistance exercise) at least 3 days a week. This may include lifting weights or doing Pilates. Do not smoke or use any products that contain nicotine or tobacco. If you need help quitting, ask your doctor. Check your blood pressure at home as told by your doctor. Keep all follow-up visits. Medicines Take over-the-counter and prescription medicines  only as told by your doctor. Follow directions carefully. Do not skip doses of blood pressure medicine. The medicine does not work as well if you skip doses. Skipping doses also puts you at risk for problems. Ask your doctor about side effects or reactions to medicines that you should watch  for. Contact a doctor if: You think you are having a reaction to the medicine you are taking. You have headaches that keep coming back. You feel dizzy. You have swelling in your ankles. You have trouble with your vision. Get help right away if: You get a very bad headache. You start to feel mixed up (confused). You feel weak or numb. You feel faint. You have very bad pain in your: Chest. Belly (abdomen). You vomit more than once. You have trouble breathing. These symptoms may be an emergency. Get help right away. Call 911. Do not wait to see if the symptoms will go away. Do not drive yourself to the hospital. Summary Hypertension is another name for high blood pressure. High blood pressure forces your heart to work harder to pump blood. For most people, a normal blood pressure is less than 120/80. Making healthy choices can help lower blood pressure. If your blood pressure does not get lower with healthy choices, you may need to take medicine. This information is not intended to replace advice given to you by your health care provider. Make sure you discuss any questions you have with your health care provider. Document Revised: 08/28/2021 Document Reviewed: 08/28/2021 Elsevier Patient Education  2024 [employer].

## 2024-05-10 NOTE — Progress Notes (Unsigned)
 I,Victoria T Basil Lim, CMA,acting as a Neurosurgeon for Smiley Dung, MD.,have documented all relevant documentation on the behalf of Smiley Dung, MD,as directed by  Smiley Dung, MD while in the presence of Smiley Dung, MD.  Subjective:  Patient ID: Candice Ayala , female    DOB: [birth year] , 68 y.o.   MRN: [id number]  Chief Complaint  Patient presents with   Hypertension    Patient presents today for bp & prediabetes follow up. She reports compliance with medications. Denies headache, chest pain & sob.   Prediabetes    HPI  Patient presents today for bp & prediabetes. She reports compliance with medications.  Denies headache, chest pain, and SOB. She denies having any specific questions or concerns.     Hypertension This is a chronic problem. The current episode started more than 1 year ago. The problem is unchanged. The problem is controlled. Pertinent negatives include no anxiety, blurred vision, chest pain, malaise/fatigue, palpitations or peripheral edema. There are no associated agents to hypertension. Risk factors for coronary artery disease include obesity and sedentary lifestyle. Past treatments include angiotensin blockers and diuretics. There are no compliance problems.  There is no history of angina or kidney disease. There is no history of chronic renal disease.     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 >35   Early death Father    Hypertension Brother    Congestive Heart Failure Brother    Breast cancer Maternal Grandmother        dx. >50     Current Outpatient Medications:    amLODipine  (NORVASC ) 5 MG tablet, Take 1 tablet (5 mg total) by mouth daily., Disp: 90 tablet, Rfl: 2    diphenhydrAMINE  (BENADRYL ) 25 MG tablet, Take 25 mg by mouth every 8 (eight) hours as needed for allergies., Disp: , Rfl:    ibuprofen  (ADVIL ) 200 MG tablet, Take 200 mg by mouth every 6 (six) hours as needed for headache or moderate pain., Disp: , Rfl:    loratadine (CLARITIN) 10 MG tablet, Take 10 mg by mouth daily as needed for allergies., Disp: , Rfl:    Multiple Vitamins-Minerals (MULTIVITAMIN WITH MINERALS) tablet, Take 1 tablet by mouth daily., Disp: , Rfl:    valsartan -hydrochlorothiazide  (DIOVAN -HCT) 320-25 MG tablet, TAKE 1 TABLET BY MOUTH EVERY DAY, Disp: 90 tablet, Rfl: 2   Allergies  Allergen Reactions   Contrast Media [Iodinated Contrast Media] Hives     Review of Systems  Constitutional: Negative.  Negative for malaise/fatigue.  Eyes:  Negative for blurred vision.  Respiratory: Negative.    Cardiovascular: Negative.  Negative for chest pain and palpitations.  Gastrointestinal: Negative.   Neurological: Negative.   Psychiatric/Behavioral: Negative.       Today's Vitals   05/10/24 0905  BP: (!) 140/80  Pulse: 73  Temp: 97.9 F (36.6 C)  SpO2: 98%  Weight: 214 lb 12.8 oz (97.4 kg)  Height: 5' 5 (1.651 m)   Body mass index is 35.74 kg/m.  Wt Readings from Last 3 Encounters:  05/10/24 214 lb 12.8 oz (97.4 kg)  03/03/24 215 lb (97.5 kg)  01/18/24 212 lb (96.2 kg)     Objective:  Physical Exam Vitals and nursing note reviewed.  Constitutional:      Appearance: Normal appearance. [name]  HENT:     Head: Normocephalic and atraumatic.   Eyes:     Extraocular Movements: Extraocular movements intact.    Cardiovascular:     Rate and Rhythm: Normal rate and regular rhythm.     Heart sounds: Normal heart sounds.  Pulmonary:     Effort: Pulmonary effort is normal.     Breath sounds: Normal breath sounds.   Musculoskeletal:     Cervical back: Normal range of motion.   Skin:    General: Skin is warm.   Neurological:     General: No focal deficit  present.     Mental Status: She is alert.   Psychiatric:        Mood and Affect: Mood normal.        Behavior: Behavior normal.         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 3 month bpc.  Patient was given opportunity to ask questions. Patient verbalized understanding of the plan and was able to repeat key elements of the plan. All questions were answered to their satisfaction.    I, Smiley Dung, MD, have reviewed all documentation for this visit. The documentation on 05/10/24 for the exam, diagnosis, procedures, and orders are all accurate and complete.   IF YOU HAVE BEEN REFERRED TO A SPECIALIST, IT MAY TAKE 1-2 WEEKS TO SCHEDULE/PROCESS THE REFERRAL. IF YOU HAVE NOT HEARD FROM US /SPECIALIST IN TWO WEEKS, PLEASE GIVE US  A CALL AT [phone].   THE PATIENT IS ENCOURAGED TO PRACTICE SOCIAL DISTANCING DUE TO THE COVID-19 PANDEMIC.

## 2024-05-11 ENCOUNTER — Ambulatory Visit: Payer: Self-pay | Admitting: Internal Medicine

## 2024-05-12 ENCOUNTER — Other Ambulatory Visit (HOSPITAL_COMMUNITY)

## 2024-05-14 NOTE — Assessment & Plan Note (Signed)
 She is s/p oophorectomy and salpingectomy. Endo input appreciated. It is thought she has low risk of recurrence since lesion was less than 2cm. She is encouraged to keep regular f/u appts with Endo.

## 2024-05-14 NOTE — Assessment & Plan Note (Signed)
 Previous labs reviewed, her A1c has been elevated in the past. I will check an A1c today. Reminded to avoid refined sugars including sugary drinks/foods and processed meats including bacon, sausages and deli meats.

## 2024-05-14 NOTE — Assessment & Plan Note (Signed)
 She is encouraged to increase her exercise to 30 minutes five days weekly to help her achieve BMI<30 to decrease cardiac risk.

## 2024-05-14 NOTE — Assessment & Plan Note (Signed)
 Blood pressure readings were 140/80 and 140/82. She occasionally forgets amlodipine  but consistently takes valsartan . No home monitoring. Discussed lifestyle modifications to manage blood pressure and avoid increasing medication dosage. - Schedule nurse visit in 4 weeks for blood pressure check. - Increase amlodipine  if blood pressure remains elevated. - Encourage exercise 30 minutes, 5 days a week. - Advise taking amlodipine  with dinner to improve adherence.

## 2024-05-14 NOTE — Assessment & Plan Note (Signed)
 Under endocrinologist care. Surgery suggested but currently monitoring as nodule is stable. No swallowing issues. Annual ultrasound performed. - Continue monitoring with endocrinologist.

## 2024-05-16 ENCOUNTER — Ambulatory Visit: Payer: 59

## 2024-06-06 ENCOUNTER — Ambulatory Visit

## 2024-06-06 VITALS — BP 128/82 | HR 80 | Temp 98.1°F | Ht 65.0 in | Wt 214.0 lb

## 2024-06-06 DIAGNOSIS — I10 Essential (primary) hypertension: Secondary | ICD-10-CM

## 2024-06-06 NOTE — Progress Notes (Signed)
 [name] She takes Amlodipine  5MG  at night & Valsartan - hydrochlorothiazide  320-25MG  in the morning. Denies headache, chest pain & sob. She has started walking regularly.  BP Readings from Last 3 Encounters:  06/06/24 128/82  05/10/24 (!) 140/82  03/03/24 124/86  Per provider patient is to continue her current regimen. Patient aware goal is less than 130/80. Patient aware she will follow up at October appointment.

## 2024-08-07 ENCOUNTER — Ambulatory Visit (HOSPITAL_COMMUNITY)
Admission: RE | Admit: 2024-08-07 | Discharge: 2024-08-07 | Disposition: A | Discharge status: Home / Self Care | Source: Ambulatory Visit | Attending: Hematology | Admitting: Hematology

## 2024-08-07 DIAGNOSIS — D0511 Intraductal carcinoma in situ of right breast: Secondary | ICD-10-CM | POA: Diagnosis present

## 2024-08-07 MED ORDER — GADOBUTROL 1 MMOL/ML IV SOLN
9.0000 mL | Freq: Once | INTRAVENOUS | Status: AC | PRN
  Administered 2024-08-07: 9 mL via INTRAVENOUS

## 2024-08-15 ENCOUNTER — Encounter: Payer: Self-pay | Admitting: Internal Medicine

## 2024-08-15 ENCOUNTER — Ambulatory Visit (INDEPENDENT_AMBULATORY_CARE_PROVIDER_SITE_OTHER): Admitting: Internal Medicine

## 2024-08-15 VITALS — BP 122/84 | HR 67 | Ht 65.0 in | Wt 216.0 lb

## 2024-08-15 DIAGNOSIS — E042 Nontoxic multinodular goiter: Secondary | ICD-10-CM | POA: Diagnosis not present

## 2024-08-15 DIAGNOSIS — Z85850 Personal history of malignant neoplasm of thyroid: Secondary | ICD-10-CM | POA: Diagnosis not present

## 2024-08-15 DIAGNOSIS — C562 Malignant neoplasm of left ovary: Secondary | ICD-10-CM

## 2024-08-15 NOTE — Progress Notes (Unsigned)
 Name: Candice Ayala  MRN/ DOB: [id number], 06-08-1956    Age/ Sex: 68 y.o., female    PCP: Jarold Medici, MD   Reason for Endocrinology Evaluation:      Date of Initial Endocrinology Evaluation: 09/03/2023    HPI: [name] is a 68 y.o. 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 The patient presented for initial endocrinology clinic visit on 09/03/2023 for consultative assistance with her history of PTC.   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  Her tumor was determined to be low risk based on classic papillary architecture, no extraovarian extension and tumor <4 cm.  BRAF testing was negative    Thyroid  ultrasound 10/2020 revealed a right mid 3.5 cm nodule meeting FNA criteria as well as subcentimeter nodules on the left. She is s/p FNA of the right mid nodule 11/06/2020 with scant cellularity, but it was read as benign.   Repeat FNA 09/2023 continued to show insufficient cellularity   She was diagnosed with right breast DCIS in 2023, treated with lumpectomy and adjuvant radiation therapy.     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   PONV (postoperative nausea and vomiting)    Pre-diabetes    Past Surgical History:  Past Surgical History:  Procedure Laterality Date   BREAST BIOPSY Right 11/27/2013   Procedure:  RIGHT BREAST BIOPSY WITH NEEDLE LOCALIZATION;  Surgeon: Krystal JINNY Russell, MD;  Location: [facility];  Service: General;  Laterality: Right;   BREAST LUMPECTOMY WITH RADIOACTIVE SEED LOCALIZATION Right 12/03/2022   Procedure: RIGHT BREAST LUMPECTOMY WITH RADIOACTIVE SEED LOCALIZATION X2;  Surgeon: Vernetta Berg, MD;  Location: [facility] OR;  Service: General;  Laterality: Right;   COLONOSCOPY     JOINT REPLACEMENT  2004   right total hip   LYSIS OF ADHESION N/A 10/01/2020   Procedure: LYSIS OF ADHESION;  Surgeon: Eloy Herring, MD;  Location: [facility];  Service: Gynecology;  Laterality: N/A;   OVARIAN CYST REMOVAL  1980   ROBOTIC ASSISTED TOTAL HYSTERECTOMY WITH BILATERAL SALPINGO OOPHERECTOMY N/A 10/01/2020   Procedure: XI ROBOTIC ASSISTED TOTAL HYSTERECTOMY WITH UNILATERL  SALPINGO OOPHORECTOMY;  Surgeon: Eloy Herring, MD;  Location: [facility];  Service: Gynecology;  Laterality: N/A;   TOTAL HIP ARTHROPLASTY Right 2004   TOTAL HIP ARTHROPLASTY Left 02/12/2022    Social History:  reports that she has never smoked. She has never used smokeless tobacco. She reports that she does not drink alcohol and does not use drugs. Family History: family history includes Breast cancer in her maternal grandmother; Colon cancer in her mother; Congestive Heart Failure in her brother; Early death in her father; Hypertension in her brother.   HOME MEDICATIONS: Allergies as of 08/15/2024       Reactions   Contrast Media [  iodinated Contrast Media] Hives        Medication List        Accurate as of August 15, 2024 12:42 PM. If you have any questions, ask your nurse or doctor.          amLODipine  5 MG tablet Commonly known as: NORVASC  Take 1 tablet (5 mg total) by mouth daily.   diphenhydrAMINE  25 MG tablet Commonly known as: BENADRYL  Take 25 mg by mouth every 8 (eight) hours as needed for allergies.   ibuprofen  200 MG tablet Commonly known as: ADVIL  Take 200 mg by mouth every 6 (six)  hours as needed for headache or moderate pain.   loratadine 10 MG tablet Commonly known as: CLARITIN Take 10 mg by mouth daily as needed for allergies.   multivitamin with minerals tablet Take 1 tablet by mouth daily.   valsartan -hydrochlorothiazide  320-25 MG tablet Commonly known as: DIOVAN -HCT TAKE 1 TABLET BY MOUTH EVERY DAY          REVIEW OF SYSTEMS: A comprehensive ROS was conducted with the patient and is negative except as per HPI    OBJECTIVE:  VS: There were no vitals taken for this visit.   Wt Readings from Last 3 Encounters:  06/06/24 214 lb (97.1 kg)  05/10/24 214 lb 12.8 oz (97.4 kg)  03/03/24 215 lb (97.5 kg)     EXAM: General: Pt appears well and is in NAD  Neck: General: Supple without adenopathy. Thyroid : Right thyroid  nodule appreciated.   Lungs: Clear with good BS bilat   Heart: Auscultation: RRR.  Extremities:  BL LE: No pretibial edema   Mental Status: Judgment, insight: Intact Orientation: Oriented to time, place, and person Mood and affect: No depression, anxiety, or agitation     DATA REVIEWED:   Latest Reference Range & Units 08/15/24 13:43  TSH 0.40 - 4.50 mIU/L 0.79     Latest Reference Range & Units 09/03/23 13:43  TSH 0.35 - 5.50 uIU/mL 1.21  Triiodothyronine,Free,Serum 2.3 - 4.2 pg/mL 3.4  T4,Free(Direct) 0.60 - 1.60 ng/dL 9.23  Thyroglobulin ng/mL 54.6 (H)  Thyroglobulin Ab < or = 1 IU/mL <1     Thyroid  ultrasound 09/08/2023  Estimated total number of nodules >/= 1 cm: 1   Number of spongiform nodules >/=  2 cm not described below (TR1): 0   Number of mixed cystic and solid nodules >/= 1.5 cm not described below (TR2): 0   _________________________________________________________   Nodule labeled 1 in the mid right thyroid  lobe is a previously biopsied solid nodule measuring up to 3.5 cm, previously 3.5 cm when measured in a similar fashion. The nodule overall remains similar in size and morphology.   A couple  of additional small subcentimeter nodule in the left thyroid  lobe do not meet criteria for further dedicated follow-up or biopsy, as previously noted.   IMPRESSION: 1. Multinodular thyroid  gland.  No new or enlarging thyroid  nodules. 2. Similar appearance of previously biopsied nodule in the right thyroid  lobe. Correlate with biopsy results.         FNA right mid nodule 10/18/2023   Clinical History: Nodule labeled 1 in the mid right thyroid  lobe is a  previously biopsied solid nodule measuring up to 3.5cm, previuosly 3.5cm  when measured in a similar fashion, The nodule overall remains similar  in size and morphology  Specimen Submitted:  A. THYROID , RIGHT MID, FINE NEEDLE ASPIRATION    FINAL MICROSCOPIC DIAGNOSIS:  - Scant follicular epithelium present (Bethesda category I)  FNA right mid nodule 11/06/2020  Clinical History: 3.5 cm x 1.8 cm x 2.4 cm RMP  Specimen Submitted:  A. THYROID , RMP, FINE NEEDLE ASPIRATION:    FINAL MICROSCOPIC DIAGNOSIS:  - Consistent with benign follicular nodule (Bethesda category II)   SPECIMEN ADEQUACY:  Satisfactory but limited for evaluation, scant cellularity    Pathology report 10/01/2020  FINAL MICROSCOPIC DIAGNOSIS:  A. FALLOPIAN TUBE AND OVARY, LEFT, SALPINGO-OOPHORECTOMY: Ovary: - Stroma ovarii with papillary thyroid  carcinoma.  See oncology table.  Fallopian tube: - No significant histopathologic findings.  B. UTERUS AND CERVIX, HYSTERECTOMY: Cervix: - Mild acute and chronic cervicitis.  Uterus: - Endometrium: Endometrial polyp. - Myometrium: Leiomyomata. - Serosa: No significant histopathologic findings.  ONCOLOGY TABLE:  OVARY or FALLOPIAN TUBE or PRIMARY PERITONEUM:  Procedure: Hysterectomy and left salpingo-oophorectomy Specimen Integrity: Ovary: Capsule disrupted Tumor Site: Left ovary Tumor Size: Greatest dimension: 9 cm (See comment) Ovarian Surface Involvement: Not identified Histologic Type:  Struma ovarii with papillary thyroid  carcinoma Other Tissue/Organ Involvement: Not identified Peritoneal/Ascitic Fluid: Benign Regional Lymph Nodes: Not applicable (no regional lymph nodes submitted or found) Pathologic Stage Classification (pTNM, AJCC 8th Edition): pT1a, pN not assigned Representative Tumor Block: A6 Comment: TTF-1 and Thyroglobulin are positive in papillary thyroid  carcinoma.  Calretinin, Inhibin and WT-1 are negative.  Overall, the ovarian mass measures 9 cm in greatest dimension, and the papillary thyroid  carcinoma component measures 1.8 cm in greatest dimension. Results reported to [facility] on 10/04/2000.  Dr. Alvaro reviewed select slides. (v1.1.1.0)   Old records , labs and images have been reviewed.     ASSESSMENT/PLAN/RECOMMENDATIONS:   Multinodular goiter:  - No local neck symptoms -Patient is clinically euthyroid -She is s/p FNA of the right mid nodule in 2021 with  SCANT CELLULARITY but somehow this was read as benign ????? -Repeat FNA of the right nodule 09/2023 revealed scant cellularity (Bethesda category I) I did recommend hemithyroidectomy, but the patient would like to avoid any surgical intervention especially with the size of her thyroid  nodule stable at 3.5 cm over the past 2 years - Will proceed with repeat thyroid  ultrasound     2. Malignant Thyroid  Type- Papillary Neoplasm in Struma Ovarii :  -This has been thought to be low risk with a PTC size of 1.8 cm, no metastases -S/p resection of the left ovary and fallopian tube as well as hysterectomy -Due to the rarity  of malignant struma ovarii and it is available literature data, no formal standard criteria have been established regarding his diagnosis, management and follow-up. -Tumor size of 2 cm or less is associated with a low chance of recurrence. -Thyroglobulin elevated, with undetectable thyroglobulin antibodies , we will use this as a baseline for future monitoring  Follow-up  in 1 yr   Signed electronically by: Stefano Redgie Butts, MD  [facility] Endocrinology  [facility] [address] [place], [place] [address] Phone: [phone] FAX: [phone]   CC: Jarold Medici, MD [address] [place] [address] Phone: [phone] Fax: [phone]   Return to Endocrinology clinic as below: Future Appointments  Date Time Provider Department Center  08/15/2024  1:20 PM Baleigh Rennaker, Donell Redgie, MD [facility] None  09/13/2024 11:40 AM Jarold Medici, MD [facility] [id number]  05/16/2025  9:40 AM [facility] VISIT [facility] [id number]

## 2024-08-16 ENCOUNTER — Ambulatory Visit: Payer: Self-pay | Admitting: Internal Medicine

## 2024-08-16 DIAGNOSIS — Z85850 Personal history of malignant neoplasm of thyroid: Secondary | ICD-10-CM

## 2024-08-16 LAB — THYROGLOBULIN LEVEL: Thyroglobulin: 98.7 ng/mL — ABNORMAL HIGH

## 2024-08-16 LAB — TSH: TSH: 0.79 m[IU]/L (ref 0.40–4.50)

## 2024-08-16 LAB — THYROGLOBULIN ANTIBODY: Thyroglobulin Ab: <1 [IU]/mL (ref <=1)

## 2024-08-29 ENCOUNTER — Ambulatory Visit
Admission: RE | Admit: 2024-08-29 | Discharge: 2024-08-29 | Disposition: A | Discharge status: Home / Self Care | Source: Ambulatory Visit | Attending: Internal Medicine | Admitting: Internal Medicine

## 2024-08-29 DIAGNOSIS — E042 Nontoxic multinodular goiter: Secondary | ICD-10-CM

## 2024-09-04 ENCOUNTER — Ambulatory Visit: Admitting: Internal Medicine

## 2024-09-07 NOTE — Telephone Encounter (Signed)
 Can you please contact the patient and let her know that her previous thyroid  nodule has increased in size, previously it was 3.5 cm nodule and now it is 4 cm nodule that is a big change in size, she also has a new thyroid  nodule that was not seen on last year's ultrasound.   I have advised the patient in the past that she should consider thyroid  surgery and she has declined, please let the patient know that based on the above I would recommend surgical intervention or at least to talk to her surgeon and see how she feels about the process, but I am concerned about all these changes in her thyroid    Thanks

## 2024-09-13 ENCOUNTER — Ambulatory Visit: Payer: Self-pay | Admitting: Internal Medicine

## 2024-09-20 ENCOUNTER — Other Ambulatory Visit: Payer: Self-pay | Admitting: Internal Medicine

## 2024-09-20 ENCOUNTER — Encounter: Payer: Self-pay | Admitting: Internal Medicine

## 2024-09-20 ENCOUNTER — Ambulatory Visit: Payer: Self-pay | Admitting: Internal Medicine

## 2024-09-20 VITALS — BP 120/84 | HR 86 | Temp 98.7°F | Ht 65.0 in | Wt 218.2 lb

## 2024-09-20 DIAGNOSIS — E042 Nontoxic multinodular goiter: Secondary | ICD-10-CM | POA: Diagnosis not present

## 2024-09-20 DIAGNOSIS — R7303 Prediabetes: Secondary | ICD-10-CM

## 2024-09-20 DIAGNOSIS — I10 Essential (primary) hypertension: Secondary | ICD-10-CM

## 2024-09-20 NOTE — Patient Instructions (Signed)
 Hypertension, Adult Hypertension is another name for high blood pressure. High blood pressure forces your heart to work harder to pump blood. This can cause problems over time. There are two numbers in a blood pressure reading. There is a top number (systolic) over a bottom number (diastolic). It is best to have a blood pressure that is below 120/80. What are the causes? The cause of this condition is not known. Some other conditions can lead to high blood pressure. What increases the risk? Some lifestyle factors can make you more likely to develop high blood pressure: Smoking. Not getting enough exercise or physical activity. Being overweight. Having too much fat, sugar, calories, or salt (sodium) in your diet. Drinking too much alcohol. Other risk factors include: Having any of these conditions: Heart disease. Diabetes. High cholesterol. Kidney disease. Obstructive sleep apnea. Having a family history of high blood pressure and high cholesterol. Age. The risk increases with age. Stress. What are the signs or symptoms? High blood pressure may not cause symptoms. Very high blood pressure (hypertensive crisis) may cause: Headache. Fast or uneven heartbeats (palpitations). Shortness of breath. Nosebleed. Vomiting or feeling like you may vomit (nauseous). Changes in how you see. Very bad chest pain. Feeling dizzy. Seizures. How is this treated? This condition is treated by making healthy lifestyle changes, such as: Eating healthy foods. Exercising more. Drinking less alcohol. Your doctor may prescribe medicine if lifestyle changes do not help enough and if: Your top number is above 130. Your bottom number is above 80. Your personal target blood pressure may vary. Follow these instructions at home: Eating and drinking  If told, follow the DASH eating plan. To follow this plan: Fill one half of your plate at each meal with fruits and vegetables. Fill one fourth of your plate  at each meal with whole grains. Whole grains include whole-wheat pasta, brown rice, and whole-grain bread. Eat or drink low-fat dairy products, such as skim milk or low-fat yogurt. Fill one fourth of your plate at each meal with low-fat (lean) proteins. Low-fat proteins include fish, chicken without skin, eggs, beans, and tofu. Avoid fatty meat, cured and processed meat, or chicken with skin. Avoid pre-made or processed food. Limit the amount of salt in your diet to less than 1,500 mg each day. Do not drink alcohol if: Your doctor tells you not to drink. You are pregnant, may be pregnant, or are planning to become pregnant. If you drink alcohol: Limit how much you have to: 0-1 drink a day for women. 0-2 drinks a day for men. Know how much alcohol is in your drink. In the [place] one drink equals one 12 oz bottle of beer (355 mL), one 5 oz glass of wine (148 mL), or one 1 oz glass of hard liquor (44 mL). Lifestyle  Work with your doctor to stay at a healthy weight or to lose weight. Ask your doctor what the best weight is for you. Get at least 30 minutes of exercise that causes your heart to beat faster (aerobic exercise) most days of the week. This may include walking, swimming, or biking. Get at least 30 minutes of exercise that strengthens your muscles (resistance exercise) at least 3 days a week. This may include lifting weights or doing Pilates. Do not smoke or use any products that contain nicotine or tobacco. If you need help quitting, ask your doctor. Check your blood pressure at home as told by your doctor. Keep all follow-up visits. Medicines Take over-the-counter and prescription medicines  only as told by your doctor. Follow directions carefully. Do not skip doses of blood pressure medicine. The medicine does not work as well if you skip doses. Skipping doses also puts you at risk for problems. Ask your doctor about side effects or reactions to medicines that you should watch  for. Contact a doctor if: You think you are having a reaction to the medicine you are taking. You have headaches that keep coming back. You feel dizzy. You have swelling in your ankles. You have trouble with your vision. Get help right away if: You get a very bad headache. You start to feel mixed up (confused). You feel weak or numb. You feel faint. You have very bad pain in your: Chest. Belly (abdomen). You vomit more than once. You have trouble breathing. These symptoms may be an emergency. Get help right away. Call 911. Do not wait to see if the symptoms will go away. Do not drive yourself to the hospital. Summary Hypertension is another name for high blood pressure. High blood pressure forces your heart to work harder to pump blood. For most people, a normal blood pressure is less than 120/80. Making healthy choices can help lower blood pressure. If your blood pressure does not get lower with healthy choices, you may need to take medicine. This information is not intended to replace advice given to you by your health care provider. Make sure you discuss any questions you have with your health care provider. Document Revised: 08/28/2021 Document Reviewed: 08/28/2021 Elsevier Patient Education  2024 [employer].

## 2024-09-20 NOTE — Progress Notes (Signed)
 I,Candice Ayala, CMA,acting as a neurosurgeon for Candice LOISE Slocumb, MD.,have documented all relevant documentation on the behalf of Candice LOISE Slocumb, MD,as directed by  Candice LOISE Slocumb, MD while in the presence of Candice LOISE Slocumb, MD.  Subjective:  Patient ID: Candice Ayala , female    DOB: 1956-05-09 , 68 y.o.   MRN: [id number]  Chief Complaint  Patient presents with   Hypertension    Patient presents today for bp & prediabetes. She reports compliance with medications.  Denies headache, chest pain, and SOB. She denies having any specific questions or concerns.   Prediabetes    HPI Discussed the use of AI scribe software for clinical note transcription with the patient, who gave verbal consent to proceed.  History of Present Illness Candice Ayala is a 68 year old female with hypertension and prediabetes who presents for a blood pressure and prediabetes check.  She is currently taking amlodipine  5 mg daily and valsartan  320/25 mg, although there is a discrepancy in her medication refill history. She does not check her blood pressure regularly at home but notes a recent reading of 126/82 mmHg at a thyroid  appointment. She has not been exercising regularly but walks occasionally.  Regarding her thyroid , an ultrasound revealed two nodules that had not grown in two years but have recently increased in size, and a new nodule was identified. She is scheduled to see a surgeon on November 12th for further evaluation.  She has been without a job since March but manages well despite the situation. She has gained four pounds since then and is considering changes to her insurance plan due to increased costs, expressing concern about potential surgery costs.  She is trying to increase her water  intake and reports that her bowel movements are generally good, although she experiences constipation when eating [employer]. She tries to eat more vegetables than meat.   Hypertension This is a chronic  problem. The current episode started more than 1 year ago. The problem is unchanged. The problem is controlled. Pertinent negatives include no anxiety, blurred vision, chest pain, malaise/fatigue, palpitations or peripheral edema. There are no associated agents to hypertension. Risk factors for coronary artery disease include obesity and sedentary lifestyle. Past treatments include angiotensin blockers and diuretics. There are no compliance problems.  There is no history of angina or kidney disease. There is no history of chronic renal disease.     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 >79   Early death Father    Hypertension Brother    Congestive Heart Failure Brother    Breast cancer Maternal Grandmother        dx. >50     Current Outpatient Medications:    amLODipine  (NORVASC ) 5 MG tablet, TAKE 1 TABLET (5 MG TOTAL) BY MOUTH DAILY., Disp: 90 tablet, Rfl: 2   diphenhydrAMINE  (BENADRYL ) 25 MG tablet, Take 25 mg by mouth every 8 (eight) hours as needed for allergies., Disp: , Rfl:    ibuprofen  (ADVIL ) 200 MG tablet, Take 200 mg by mouth every 6 (six) hours as needed for headache or moderate pain., Disp: , Rfl:  loratadine (CLARITIN) 10 MG tablet, Take 10 mg by mouth daily as needed for allergies., Disp: , Rfl:    Multiple Vitamins-Minerals (MULTIVITAMIN WITH MINERALS) tablet, Take 1 tablet by mouth daily., Disp: , Rfl:    valsartan -hydrochlorothiazide  (DIOVAN -HCT) 320-25 MG tablet, TAKE 1 TABLET BY MOUTH EVERY DAY, Disp: 90 tablet, Rfl: 2   Allergies  Allergen Reactions   Contrast Media [Iodinated Contrast Media] Hives     Review of Systems  Constitutional: Negative.  Negative for malaise/fatigue.  Eyes:  Negative  for blurred vision.  Respiratory: Negative.    Cardiovascular: Negative.  Negative for chest pain and palpitations.  Gastrointestinal: Negative.   Neurological: Negative.   Psychiatric/Behavioral: Negative.       Today's Vitals   09/20/24 1555  BP: 120/84  Pulse: 86  Temp: 98.7 F (37.1 C)  SpO2: 98%  Weight: 218 lb 3.2 oz (99 kg)  Height: 5' 5 (1.651 m)   Body mass index is 36.31 kg/m.  Wt Readings from Last 3 Encounters:  09/20/24 218 lb 3.2 oz (99 kg)  08/15/24 216 lb (98 kg)  06/06/24 214 lb (97.1 kg)    BP Readings from Last 3 Encounters:  09/20/24 120/84  08/15/24 122/84  06/06/24 128/82     Objective:  Physical Exam Vitals and nursing note reviewed.  Constitutional:      Appearance: Normal appearance. [name]  HENT:     Head: Normocephalic and atraumatic.  Eyes:     Extraocular Movements: Extraocular movements intact.  Cardiovascular:     Rate and Rhythm: Normal rate and regular rhythm.     Heart sounds: Normal heart sounds.  Pulmonary:     Effort: Pulmonary effort is normal.     Breath sounds: Normal breath sounds.  Musculoskeletal:     Cervical back: Normal range of motion.  Skin:    General: Skin is warm.  Neurological:     General: No focal deficit present.     Mental Status: She is alert.  Psychiatric:        Mood and Affect: Mood normal.        Behavior: Behavior normal.         Assessment And Plan:  Essential hypertension Assessment & Plan: Chronic, fair control. Goal BP <130/80.  She is encouraged to follow low sodium diet.  - Continue amlodipine  5 mg daily. - Confirm valsartan  prescription adherence. - Encourage regular home blood pressure monitoring.  Orders: -     CMP14+EGFR  Prediabetes Assessment & Plan: Previous labs reviewed, her A1c has been elevated in the past. I will check an A1c today. Reminded to avoid refined sugars including sugary drinks/foods and processed meats including bacon, sausages and deli meats.     Orders: -     CMP14+EGFR -     Hemoglobin A1c  Multinodular goiter Assessment & Plan: Nodules have grown; new nodule detected on ultrasound. - She has been referred to Dr. Eletha for surgical evaluation. - Follow-up with Dr. Eletha on November 12.    Return for 4 month predm f/u.SABRA  Patient was given opportunity to ask questions. Patient verbalized understanding of the plan and was able to repeat key elements of the plan. All questions were answered to their satisfaction.   I, Candice LOISE Slocumb, MD, have reviewed all documentation for this visit. The documentation on 09/20/24 for the exam, diagnosis, procedures, and orders are all accurate and complete.   IF YOU HAVE BEEN REFERRED TO A SPECIALIST, IT MAY TAKE 1-2  WEEKS TO SCHEDULE/PROCESS THE REFERRAL. IF YOU HAVE NOT HEARD FROM US /SPECIALIST IN TWO WEEKS, PLEASE GIVE US  A CALL AT [phone].   THE PATIENT IS ENCOURAGED TO PRACTICE SOCIAL DISTANCING DUE TO THE COVID-19 PANDEMIC.

## 2024-09-21 ENCOUNTER — Ambulatory Visit: Payer: Self-pay | Admitting: Internal Medicine

## 2024-09-21 LAB — CMP14+EGFR
ALT: 13 IU/L (ref 0–32)
AST: 16 IU/L (ref 0–40)
Albumin: 4.1 g/dL (ref 3.9–4.9)
Alkaline Phosphatase: 111 IU/L (ref 49–135)
BUN/Creatinine Ratio: 16 (ref 12–28)
BUN: 13 mg/dL (ref 8–27)
Bilirubin Total: 0.3 mg/dL (ref 0.0–1.2)
CO2: 23 mmol/L (ref 20–29)
Calcium: 10 mg/dL (ref 8.7–10.3)
Chloride: 103 mmol/L (ref 96–106)
Creatinine, Ser: 0.81 mg/dL (ref 0.57–1.00)
Globulin, Total: 3.1 g/dL (ref 1.5–4.5)
Glucose: 98 mg/dL (ref 70–99)
Potassium: 4.8 mmol/L (ref 3.5–5.2)
Sodium: 141 mmol/L (ref 134–144)
Total Protein: 7.2 g/dL (ref 6.0–8.5)
eGFR: 78 mL/min/1.73 (ref >59)

## 2024-09-21 LAB — HEMOGLOBIN A1C
Est. average glucose Bld gHb Est-mCnc: 131 mg/dL
Hgb A1c MFr Bld: 6.2 % — ABNORMAL HIGH (ref 4.8–5.6)

## 2024-09-24 NOTE — Assessment & Plan Note (Signed)
 Chronic, fair control. Goal BP <130/80.  She is encouraged to follow low sodium diet.  - Continue amlodipine  5 mg daily. - Confirm valsartan  prescription adherence. - Encourage regular home blood pressure monitoring.

## 2024-09-24 NOTE — Assessment & Plan Note (Signed)
 Previous labs reviewed, her A1c has been elevated in the past. I will check an A1c today. Reminded to avoid refined sugars including sugary drinks/foods and processed meats including bacon, sausages and deli meats.

## 2024-09-24 NOTE — Assessment & Plan Note (Signed)
 Nodules have grown; new nodule detected on ultrasound. - She has been referred to Dr. Eletha for surgical evaluation. - Follow-up with Dr. Eletha on November 12.

## 2024-10-04 ENCOUNTER — Ambulatory Visit: Payer: Self-pay | Admitting: Surgery

## 2024-10-10 ENCOUNTER — Encounter (HOSPITAL_COMMUNITY): Payer: Self-pay | Admitting: Surgery

## 2024-10-10 NOTE — Patient Instructions (Signed)
 SURGICAL WAITING ROOM VISITATION  Patients having surgery or a procedure may have no more than 2 support people in the waiting area - these visitors may rotate.    Children under the age of 7 must have an adult with them who is not the patient.  Visitors with respiratory illnesses are discouraged from visiting and should remain at home.  If the patient needs to stay at the hospital during part of their recovery, the visitor guidelines for inpatient rooms apply. Pre-op nurse will coordinate an appropriate time for 1 support person to accompany patient in pre-op.  This support person may not rotate.    Please refer to the [facility] website for the visitor guidelines for Inpatients (after your surgery is over and you are in a regular room).       Your procedure is scheduled on: 10/13/2024    Report to [facility] Main Entrance    Report to admitting at  1145 AM   Call this number if you have problems the morning of surgery [phone]   Do not eat food :After Midnight.   After Midnight you may have the following liquids until  1045______ AM DAY OF SURGERY  Water  Non-Citrus Juices (without pulp, NO RED-Apple, White grape, White cranberry) Black Coffee (NO MILK/CREAM OR CREAMERS, sugar ok)  Clear Tea (NO MILK/CREAM OR CREAMERS, sugar ok) regular and decaf                             Plain Jell-O (NO RED)                                           Fruit ices (not with fruit pulp, NO RED)                                     Popsicles (NO RED)                                                               Sports drinks like Gatorade (NO RED)                      If you have questions, please contact your surgeon's office.     Oral Hygiene is also important to reduce your risk of infection.                                    Remember - BRUSH YOUR TEETH THE MORNING OF SURGERY WITH YOUR REGULAR TOOTHPASTE  DENTURES WILL BE REMOVED PRIOR TO SURGERY PLEASE DO NOT APPLY Poly  grip OR ADHESIVES!!!   Do NOT smoke after Midnight   Stop all vitamins and herbal supplements 7 days before surgery.   Take these medicines the morning of surgery with A SIP OF WATER :  amlodipione, flonase , claritin if needed   DO NOT TAKE ANY ORAL DIABETIC MEDICATIONS DAY OF YOUR SURGERY  Bring CPAP mask and tubing day of surgery.  You may not have any metal on your body including hair pins, jewelry, and body piercing             Do not wear make-up, lotions, powders, perfumes/cologne, or deodorant  Do not wear nail polish including gel and S&S, artificial/acrylic nails, or any other type of covering on natural nails including finger and toenails. If you have artificial nails, gel coating, etc. that needs to be removed by a nail salon please have this removed prior to surgery or surgery may need to be canceled/ delayed if the surgeon/ anesthesia feels like they are unable to be safely monitored.   Do not shave  48 hours prior to surgery.               Men may shave face and neck.   Do not bring valuables to the hospital. [place] IS NOT             RESPONSIBLE   FOR VALUABLES.   Contacts, glasses, dentures or bridgework may not be worn into surgery.   Bring small overnight bag day of surgery.   DO NOT BRING YOUR HOME MEDICATIONS TO THE HOSPITAL. PHARMACY WILL DISPENSE MEDICATIONS LISTED ON YOUR MEDICATION LIST TO YOU DURING YOUR ADMISSION IN THE HOSPITAL!    Patients discharged on the day of surgery will not be allowed to drive home.  Someone NEEDS to stay with you for the first 24 hours after anesthesia.   Special Instructions: Bring a copy of your healthcare power of attorney and living will documents the day of surgery if you haven't scanned them before.              Please read over the following fact sheets you were given: IF YOU HAVE QUESTIONS ABOUT YOUR PRE-OP INSTRUCTIONS PLEASE CALL [phone].   If you received a COVID test during your  pre-op visit  it is requested that you wear a mask when out in public, stay away from anyone that may not be feeling well and notify your surgeon if you develop symptoms. If you test positive for Covid or have been in contact with anyone that has tested positive in the last 10 days please notify you surgeon.    [facility] - Preparing for Surgery Before surgery, you can play an important role.  Because skin is not sterile, your skin needs to be as free of germs as possible.  You can reduce the number of germs on your skin by washing with CHG (chlorahexidine gluconate) soap before surgery.  CHG is an antiseptic cleaner which kills germs and bonds with the skin to continue killing germs even after washing. Please DO NOT use if you have an allergy to CHG or antibacterial soaps.  If your skin becomes reddened/irritated stop using the CHG and inform your nurse when you arrive at Short Stay. Do not shave (including legs and underarms) for at least 48 hours prior to the first CHG shower.  You may shave your face/neck.  Please follow these instructions carefully:  1.  Shower with CHG Soap the night before surgery ONLY (DO NOT USE THE SOAP THE MORNING OF SURGERY).  2.  If you choose to wash your hair, wash your hair first as usual with your normal  shampoo.  3.  After you shampoo, rinse your hair and body thoroughly to remove the shampoo.  4.  Use CHG as you would any other liquid soap.  You can apply chg directly to the skin and wash.  Gently with a scrungie or clean washcloth.  5.  Apply the CHG Soap to your body ONLY FROM THE NECK DOWN.   Do   not use on face/ open                           Wound or open sores. Avoid contact with eyes, ears mouth and   genitals (private parts).                       Wash face,  Genitals (private parts) with your normal soap.             6.  Wash thoroughly, paying special attention to the area where your    surgery  will be performed.  7.   Thoroughly rinse your body with warm water  from the neck down.  8.  DO NOT shower/wash with your normal soap after using and rinsing off the CHG Soap.                9.  Pat yourself dry with a clean towel.            10.  Wear clean pajamas.            11.  Place clean sheets on your bed the night of your first shower and do not  sleep with pets. Day of Surgery : Do not apply any CHG, lotions/deodorants the morning of surgery.  Please wear clean clothes to the hospital/surgery center.  FAILURE TO FOLLOW THESE INSTRUCTIONS MAY RESULT IN THE CANCELLATION OF YOUR SURGERY  PATIENT SIGNATURE_________________________________  NURSE SIGNATURE__________________________________  ________________________________________________________________________

## 2024-10-10 NOTE — H&P (Signed)
 REFERRING PHYSICIAN: Shamleffer, Donell, MD  PROVIDER: Yaire Kreher Candice SPINNER, MD   Chief Complaint: New Consultation (HX OF STROMA OVARII WITH PAPILLARY THYROID  CARCINOMA )  History of Present Illness:  Patient is referred by Dr. 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 Patient has a history of thyroid  nodules dating back at least 4 years. She has undergone sequential ultrasound scanning as well as to fine-needle aspiration biopsies. Recently there has been enlargement in the dominant nodules in the right lobe and right side of the isthmus, both measuring 4 cm in size. One previous fine-needle aspiration biopsy had insufficient material for diagnosis. The second biopsy was a Bethesda category II, representing a benign nodule. However, on her most recent ultrasound examination from October 2025, there had been significant interval enlargement of the dominant nodules. Patient does have a history of oophorectomy for stroma with a 1.8 cm focus of papillary thyroid  carcinoma. Patient has never been on thyroid  medication. Her most recent TSH level was normal at 0.79. She has had no prior head or neck surgery. There is no family history of thyroid  disease and specifically no history of thyroid  cancer. Patient denies any compressive symptoms.  Review of Systems: A complete review of systems was obtained from the patient. I have reviewed this information and discussed as appropriate with the patient. See HPI as well for other ROS.  Review of Systems  Constitutional: Negative.  HENT: Negative.  Eyes: Negative.  Respiratory: Negative.  Cardiovascular: Negative.  Gastrointestinal: Negative.  Genitourinary: Negative.  Musculoskeletal: Negative.  Skin: Negative.  Neurological: Negative.  Endo/Heme/Allergies: Negative.  Psychiatric/Behavioral: Negative.     Medical History: Past Medical History:  Diagnosis Date  Anemia  Arthritis  Hypertension   Patient Active Problem List  Diagnosis  Atypical ductal hyperplasia of breast  Ductal carcinoma in situ (DCIS) of right breast  Multiple thyroid  nodules   Past Surgical History:  Procedure Laterality Date  ARTHROPLASTY HIP TOTAL Left 03/2022  Breast Surgery Right 12/03/2022  .Joint replacement Left  Unknown date    Allergies  Allergen Reactions  Iodinated Contrast Media Hives  Swelling of face, lips, ears.   Current Outpatient Medications on File Prior to Visit  Medication Sig Dispense Refill  amLODIPine  (NORVASC ) 5 MG tablet Take 5 mg by mouth once daily  valsartan -hydroCHLOROthiazide  (DIOVAN -HCT) 320-25 mg tablet Take 1 tablet by mouth once daily   No current facility-administered medications on file prior to visit.   Family History  Problem Relation Age of Onset  Colon cancer Mother    Social History   Tobacco Use  Smoking Status Never  Smokeless Tobacco Never    Social History   Socioeconomic History  Marital status: Single  Tobacco Use  Smoking status: Never  Smokeless tobacco: Never  Vaping Use  Vaping status: Never Used  Substance and Sexual Activity  Alcohol use: Never  Drug use: Never  Sexual activity: Defer   Social Drivers of Health   Financial Resource Strain: Low Risk (03/29/2024)  Received [facility]  Overall Financial Resource Strain (CARDIA)  Difficulty of Paying Living Expenses: Not hard at all  Food Insecurity: No Food Insecurity (03/29/2024)  Received from [facility]  Hunger Vital Sign  Within the past 12 months, you worried that your food would run out before you got the money to buy more.: Never true  Within the past 12 months, the  food you bought just didn't last and you didn't have money to get more.: Never true  Transportation Needs: No Transportation Needs (03/29/2024)  Received from [facility] - Transportation   Lack of Transportation (Medical): No  Lack of Transportation (Non-Medical): No  Physical Activity: Inactive (03/29/2024)  Received from [facility]  Exercise Vital Sign  On average, how many days per week do you engage in moderate to strenuous exercise (like a brisk walk)?: 0 days  On average, how many minutes do you engage in exercise at this level?: 0 min  Stress: No Stress Concern Present (03/29/2024)  Received from [facility] - Occupational Stress Questionnaire  Feeling of Stress : Not at all  Social Connections: Moderately Isolated (03/29/2024)  Received from [facility]  Social Connection and Isolation Panel  In a typical week, how many times do you talk on the phone with family, friends, or neighbors?: More than three times a week  How often do you get together with friends or relatives?: Three times a week  How often do you attend church or religious services?: More than 4 times per year  Do you belong to any clubs or organizations such as church groups, unions, fraternal or athletic groups, or school groups?: No  How often do you attend meetings of the clubs or organizations you belong to?: Never  Are you married, widowed, divorced, separated, never married, or living with a partner?: Divorced  Housing Stability: Unknown (10/04/2024)  Housing Stability Vital Sign  Homeless in the Last Year: No   Objective:   Vitals:  BP: (!) 173/83  Pulse: 65  Resp: 16  Temp: 36.9 C (98.4 F)  SpO2: 98%  Weight: 98.1 kg (216 lb 3.2 oz)  Height: 165.1 cm (5' 5)  PainSc: 0-No pain   Body mass index is 35.98 kg/m.  Physical Exam   GENERAL APPEARANCE Comfortable, no acute issues Development: normal Gross deformities: none  SKIN Rash, lesions, ulcers: none Induration, erythema: none Nodules: none palpable  EYES Conjunctiva and lids: normal Pupils: equal  EARS, NOSE, MOUTH, THROAT External ears: no lesion or deformity External nose:  no lesion or deformity Hearing: grossly normal  NECK Symmetric: no Trachea: midline Thyroid : There is a dominant nodule in the mid right thyroid  lobe, this is approximately 4 cm in size, smooth, mobile, and nontender. There is a palpable nodule in the right side of the isthmus extending beneath the clavicle in the manubrium. This is also smooth and mobile with swallowing. The small nodule in the left thyroid  lobe is not palpable on today's examination. There is no associated lymphadenopathy.  CHEST/CV Not assessed  ABDOMEN Not assessed  GENITOURINARY/RECTAL Not assessed  MUSCULOSKELETAL Station and gait: normal Digits and nails: no clubbing or cyanosis Muscle strength: grossly normal all extremities Deformity: none  LYMPHATIC Cervical: none palpable Supraclavicular: none palpable  PSYCHIATRIC Oriented to person, place, and time: yes Mood and affect: normal for situation Judgment and insight: appropriate for situation   Assessment and Plan:   Multiple thyroid  nodules  Patient is referred by her endocrinologist, Dr. Stefano Butts, for consideration for thyroidectomy for management of enlarging thyroid  nodules now exceeding 4 cm in diameter.  Patient provided with a copy of The Thyroid  Book: Medical and Surgical Treatment of Thyroid  Problems, published by [employer], 16 pages. Book reviewed and explained to patient during visit today.  Today we reviewed her clinical history. We reviewed her recent laboratory studies. We reviewed her most recent  ultrasound report. Patient does have enlarging thyroid  nodules. At 4 cm in size, the fine-needle aspiration biopsy results may not be reliable. She does have an interesting clinical history of papillary thyroid  carcinoma arising in an ovarian tumor. She does not have any significant compressive symptoms. Her thyroid  function is normal. We discussed options for management. Today we discussed total thyroidectomy. We discussed the procedure.  We discussed the risk and benefits including the risk of recurrent nerve injury and injury to parathyroid glands. We discussed the hospital stay to be anticipated. We discussed the size and location of the surgical incision. We discussed the need for lifelong thyroid  hormone replacement following surgery. We discussed the postoperative recovery to be anticipated. The patient understands and wishes to proceed with thyroid  surgery in the near future.   Krystal Spinner, MD [facility] practice Office: [phone]

## 2024-10-11 ENCOUNTER — Encounter (HOSPITAL_COMMUNITY): Payer: Self-pay

## 2024-10-11 ENCOUNTER — Encounter (HOSPITAL_COMMUNITY)
Admission: RE | Admit: 2024-10-11 | Discharge: 2024-10-11 | Disposition: A | Discharge status: Home / Self Care | Source: Ambulatory Visit | Attending: Surgery | Admitting: Surgery

## 2024-10-11 ENCOUNTER — Other Ambulatory Visit: Payer: Self-pay

## 2024-10-11 VITALS — BP 127/97 | HR 69 | Temp 98.2°F | Resp 16 | Ht 65.0 in | Wt 218.0 lb

## 2024-10-11 DIAGNOSIS — Z01818 Encounter for other preprocedural examination: Secondary | ICD-10-CM | POA: Insufficient documentation

## 2024-10-11 LAB — BASIC METABOLIC PANEL WITH GFR
Anion gap: 9 (ref 5–15)
BUN: 14 mg/dL (ref 8–23)
CO2: 27 mmol/L (ref 22–32)
Calcium: 9.8 mg/dL (ref 8.9–10.3)
Chloride: 104 mmol/L (ref 98–111)
Creatinine, Ser: 0.8 mg/dL (ref 0.44–1.00)
GFR, Estimated: >60 mL/min (ref >60)
Glucose, Bld: 100 mg/dL — ABNORMAL HIGH (ref 70–99)
Potassium: 4.3 mmol/L (ref 3.5–5.1)
Sodium: 139 mmol/L (ref 135–145)

## 2024-10-11 LAB — CBC
HCT: 37.8 % (ref 36.0–46.0)
Hemoglobin: 12 g/dL (ref 12.0–15.0)
MCH: 28.4 pg (ref 26.0–34.0)
MCHC: 31.7 g/dL (ref 30.0–36.0)
MCV: 89.4 fL (ref 80.0–100.0)
Platelets: 341 K/uL (ref 150–400)
RBC: 4.23 MIL/uL (ref 3.87–5.11)
RDW: 14.4 % (ref 11.5–15.5)
WBC: 5.1 K/uL (ref 4.0–10.5)
nRBC: 0 % (ref 0.0–0.2)

## 2024-10-11 NOTE — Progress Notes (Addendum)
 Anesthesia Review:  PCP: Catheryn Slocumb  LOV 09/20/24  Cardiologist : none   PPM/ ICD: Device Orders: Rep Notified:  Chest x-ray : EKG : 10/11/24  Echo : Stress test: Cardiac Cath :   Activity level: can do a flight of stairs without difficutly  Sleep Study/ CPAP : none  Fasting Blood Sugar :      / Checks Blood Sugar -- times a day:    Blood Thinner/ Instructions /Last Dose: ASA / Instructions/ Last Dose :    Hgba1c-09/20/24- 6.2

## 2024-10-13 ENCOUNTER — Other Ambulatory Visit: Payer: Self-pay

## 2024-10-13 ENCOUNTER — Encounter (HOSPITAL_COMMUNITY): Payer: Self-pay | Admitting: Surgery

## 2024-10-13 ENCOUNTER — Ambulatory Visit (HOSPITAL_COMMUNITY): Payer: Self-pay | Admitting: Physician Assistant

## 2024-10-13 ENCOUNTER — Ambulatory Visit (HOSPITAL_COMMUNITY): Admitting: Registered Nurse

## 2024-10-13 ENCOUNTER — Ambulatory Visit (HOSPITAL_COMMUNITY)
Admission: RE | Admit: 2024-10-13 | Discharge: 2024-10-14 | Disposition: A | Discharge status: Home / Self Care | Attending: Surgery | Admitting: Surgery

## 2024-10-13 ENCOUNTER — Encounter (HOSPITAL_COMMUNITY)
Admission: RE | Disposition: A | Discharge status: Home / Self Care | Payer: Self-pay | Source: Home / Self Care | Attending: Surgery

## 2024-10-13 DIAGNOSIS — E041 Nontoxic single thyroid nodule: Secondary | ICD-10-CM

## 2024-10-13 DIAGNOSIS — E042 Nontoxic multinodular goiter: Secondary | ICD-10-CM | POA: Insufficient documentation

## 2024-10-13 DIAGNOSIS — Z853 Personal history of malignant neoplasm of breast: Secondary | ICD-10-CM | POA: Diagnosis not present

## 2024-10-13 DIAGNOSIS — I10 Essential (primary) hypertension: Secondary | ICD-10-CM | POA: Insufficient documentation

## 2024-10-13 DIAGNOSIS — Z85850 Personal history of malignant neoplasm of thyroid: Secondary | ICD-10-CM | POA: Diagnosis not present

## 2024-10-13 DIAGNOSIS — Z01818 Encounter for other preprocedural examination: Secondary | ICD-10-CM

## 2024-10-13 HISTORY — PX: THYROIDECTOMY: SHX17

## 2024-10-13 SURGERY — THYROIDECTOMY
Anesthesia: General

## 2024-10-13 MED ORDER — HYDROMORPHONE HCL 1 MG/ML IJ SOLN: 1.0000 mg | INTRAMUSCULAR | Status: DC | PRN

## 2024-10-13 MED ORDER — ONDANSETRON HCL 4 MG/2ML IJ SOLN
INTRAMUSCULAR | Status: AC
  Filled 2024-10-13: qty 2

## 2024-10-13 MED ORDER — CHLORHEXIDINE GLUCONATE CLOTH 2 % EX PADS: 6.0000 | MEDICATED_PAD | Freq: Once | CUTANEOUS | Status: DC

## 2024-10-13 MED ORDER — PROPOFOL 10 MG/ML IV BOLUS
INTRAVENOUS | Status: DC | PRN
  Administered 2024-10-13: 100 ug/kg/min via INTRAVENOUS
  Administered 2024-10-13: 150 mg via INTRAVENOUS

## 2024-10-13 MED ORDER — ROCURONIUM BROMIDE 10 MG/ML (PF) SYRINGE
PREFILLED_SYRINGE | INTRAVENOUS | Status: AC
  Filled 2024-10-13: qty 10

## 2024-10-13 MED ORDER — ROCURONIUM BROMIDE 100 MG/10ML IV SOLN
INTRAVENOUS | Status: DC | PRN
  Administered 2024-10-13: 60 mg via INTRAVENOUS
  Administered 2024-10-13: 10 mg via INTRAVENOUS

## 2024-10-13 MED ORDER — ONDANSETRON 4 MG PO TBDP: 4.0000 mg | ORAL_TABLET | Freq: Four times a day (QID) | ORAL | Status: DC | PRN

## 2024-10-13 MED ORDER — LIDOCAINE HCL (PF) 2 % IJ SOLN
INTRAMUSCULAR | Status: AC
  Filled 2024-10-13: qty 5

## 2024-10-13 MED ORDER — MIDAZOLAM HCL 5 MG/5ML IJ SOLN
INTRAMUSCULAR | Status: DC | PRN
  Administered 2024-10-13: 2 mg via INTRAVENOUS

## 2024-10-13 MED ORDER — DEXAMETHASONE SOD PHOSPHATE PF 10 MG/ML IJ SOLN
INTRAMUSCULAR | Status: DC | PRN
  Administered 2024-10-13: 10 mg via INTRAVENOUS

## 2024-10-13 MED ORDER — FENTANYL CITRATE (PF) 100 MCG/2ML IJ SOLN
INTRAMUSCULAR | Status: DC | PRN
  Administered 2024-10-13 (×2): 50 ug via INTRAVENOUS
  Administered 2024-10-13: 100 ug via INTRAVENOUS

## 2024-10-13 MED ORDER — OXYCODONE HCL 5 MG PO TABS: 5.0000 mg | ORAL_TABLET | ORAL | Status: DC | PRN

## 2024-10-13 MED ORDER — CHLORHEXIDINE GLUCONATE 0.12 % MT SOLN
15.0000 mL | Freq: Once | OROMUCOSAL | Status: AC
  Administered 2024-10-13: 15 mL via OROMUCOSAL

## 2024-10-13 MED ORDER — HEMOSTATIC AGENTS (NO CHARGE) OPTIME
TOPICAL | Status: DC | PRN
  Administered 2024-10-13: 1

## 2024-10-13 MED ORDER — ACETAMINOPHEN 10 MG/ML IV SOLN: 1000.0000 mg | Freq: Once | INTRAVENOUS | Status: DC | PRN

## 2024-10-13 MED ORDER — AMLODIPINE BESYLATE 5 MG PO TABS
5.0000 mg | ORAL_TABLET | Freq: Every day | ORAL | Status: DC
  Administered 2024-10-14: 5 mg via ORAL
  Filled 2024-10-13 (×2): qty 1

## 2024-10-13 MED ORDER — ONDANSETRON HCL 4 MG/2ML IJ SOLN
INTRAMUSCULAR | Status: DC | PRN
Start: 2024-10-13 — End: 2024-10-13
  Administered 2024-10-13: 4 mg via INTRAVENOUS

## 2024-10-13 MED ORDER — CEFAZOLIN SODIUM-DEXTROSE 2-4 GM/100ML-% IV SOLN
2.0000 g | INTRAVENOUS | Status: AC
  Administered 2024-10-13: 2 g via INTRAVENOUS
  Filled 2024-10-13: qty 100

## 2024-10-13 MED ORDER — FLUTICASONE PROPIONATE 50 MCG/ACT NA SUSP
2.0000 | Freq: Every day | NASAL | Status: DC
  Administered 2024-10-14: 2 via NASAL
  Filled 2024-10-13: qty 16

## 2024-10-13 MED ORDER — LEVOTHYROXINE SODIUM 100 MCG PO TABS: 100.0000 ug | ORAL_TABLET | Freq: Every day | ORAL | 2 refills | Status: DC

## 2024-10-13 MED ORDER — CALCIUM CARBONATE 1250 (500 CA) MG PO TABS
2.0000 | ORAL_TABLET | Freq: Three times a day (TID) | ORAL | Status: DC
  Administered 2024-10-13 – 2024-10-14 (×2): 2500 mg via ORAL
  Filled 2024-10-13 (×3): qty 2

## 2024-10-13 MED ORDER — FENTANYL CITRATE (PF) 50 MCG/ML IJ SOSY
PREFILLED_SYRINGE | INTRAMUSCULAR | Status: AC
  Filled 2024-10-13: qty 1

## 2024-10-13 MED ORDER — MIDAZOLAM HCL 2 MG/2ML IJ SOLN
INTRAMUSCULAR | Status: AC
  Filled 2024-10-13: qty 2

## 2024-10-13 MED ORDER — FENTANYL CITRATE (PF) 50 MCG/ML IJ SOSY
25.0000 ug | PREFILLED_SYRINGE | INTRAMUSCULAR | Status: DC | PRN
  Administered 2024-10-13 (×3): 25 ug via INTRAVENOUS

## 2024-10-13 MED ORDER — TRAMADOL HCL 50 MG PO TABS: 50.0000 mg | ORAL_TABLET | Freq: Four times a day (QID) | ORAL | 0 refills | Status: AC | PRN

## 2024-10-13 MED ORDER — GLYCOPYRROLATE 0.2 MG/ML IJ SOLN
INTRAMUSCULAR | Status: DC | PRN
  Administered 2024-10-13: .2 mg via INTRAVENOUS

## 2024-10-13 MED ORDER — PROPOFOL 1000 MG/100ML IV EMUL
INTRAVENOUS | Status: AC
  Filled 2024-10-13: qty 100

## 2024-10-13 MED ORDER — DROPERIDOL 2.5 MG/ML IJ SOLN: 0.6250 mg | Freq: Once | INTRAMUSCULAR | Status: DC | PRN

## 2024-10-13 MED ORDER — ACETAMINOPHEN 325 MG PO TABS: 650.0000 mg | ORAL_TABLET | Freq: Four times a day (QID) | ORAL | Status: DC | PRN

## 2024-10-13 MED ORDER — IRBESARTAN 150 MG PO TABS
300.0000 mg | ORAL_TABLET | Freq: Every day | ORAL | Status: DC
  Administered 2024-10-14: 300 mg via ORAL
  Filled 2024-10-13 (×2): qty 2

## 2024-10-13 MED ORDER — ORAL CARE MOUTH RINSE: 15.0000 mL | Freq: Once | OROMUCOSAL | Status: AC

## 2024-10-13 MED ORDER — ACETAMINOPHEN 650 MG RE SUPP: 650.0000 mg | Freq: Four times a day (QID) | RECTAL | Status: DC | PRN

## 2024-10-13 MED ORDER — LACTATED RINGERS IV SOLN: INTRAVENOUS | Status: DC

## 2024-10-13 MED ORDER — HYDROCHLOROTHIAZIDE 25 MG PO TABS
25.0000 mg | ORAL_TABLET | Freq: Every day | ORAL | Status: DC
  Administered 2024-10-14: 25 mg via ORAL
  Filled 2024-10-13 (×2): qty 1

## 2024-10-13 MED ORDER — CALCIUM CARBONATE ANTACID 500 MG PO CHEW: 2.0000 | CHEWABLE_TABLET | Freq: Three times a day (TID) | ORAL | 1 refills | Status: AC

## 2024-10-13 MED ORDER — 0.9 % SODIUM CHLORIDE (POUR BTL) OPTIME
TOPICAL | Status: DC | PRN
  Administered 2024-10-13: 1000 mL

## 2024-10-13 MED ORDER — LIDOCAINE HCL (CARDIAC) PF 100 MG/5ML IV SOSY
PREFILLED_SYRINGE | INTRAVENOUS | Status: DC | PRN
  Administered 2024-10-13: 60 mg via INTRAVENOUS

## 2024-10-13 MED ORDER — PHENYLEPHRINE HCL-NACL 20-0.9 MG/250ML-% IV SOLN
INTRAVENOUS | Status: DC | PRN
  Administered 2024-10-13: 20 ug/min via INTRAVENOUS

## 2024-10-13 MED ORDER — SODIUM CHLORIDE 0.45 % IV SOLN: INTRAVENOUS | Status: DC

## 2024-10-13 MED ORDER — ONDANSETRON HCL 4 MG/2ML IJ SOLN
4.0000 mg | Freq: Four times a day (QID) | INTRAMUSCULAR | Status: DC | PRN
  Administered 2024-10-13: 4 mg via INTRAVENOUS
  Filled 2024-10-13: qty 2

## 2024-10-13 MED ORDER — TRAMADOL HCL 50 MG PO TABS
50.0000 mg | ORAL_TABLET | Freq: Four times a day (QID) | ORAL | Status: DC | PRN
  Administered 2024-10-14: 50 mg via ORAL
  Filled 2024-10-13: qty 1

## 2024-10-13 MED ORDER — FENTANYL CITRATE (PF) 100 MCG/2ML IJ SOLN
INTRAMUSCULAR | Status: AC
  Filled 2024-10-13: qty 2

## 2024-10-13 MED ORDER — SUGAMMADEX SODIUM 200 MG/2ML IV SOLN
INTRAVENOUS | Status: AC
  Filled 2024-10-13: qty 2

## 2024-10-13 MED ORDER — VALSARTAN-HYDROCHLOROTHIAZIDE 320-25 MG PO TABS: 1.0000 | ORAL_TABLET | Freq: Every day | ORAL | Status: DC

## 2024-10-13 MED ORDER — SUGAMMADEX SODIUM 200 MG/2ML IV SOLN
INTRAVENOUS | Status: DC | PRN
  Administered 2024-10-13: 200 mg via INTRAVENOUS

## 2024-10-13 SURGICAL SUPPLY — 27 items
BAG COUNTER SPONGE SURGICOUNT (BAG) ×2 IMPLANT
BLADE SURG 15 STRL LF DISP TIS (BLADE) ×2 IMPLANT
CHLORAPREP W/TINT 26 (MISCELLANEOUS) ×2 IMPLANT
CLIP TI MEDIUM 6 (CLIP) ×4 IMPLANT
CLIP TI WIDE RED SMALL 6 (CLIP) ×4 IMPLANT
COVER SURGICAL LIGHT HANDLE (MISCELLANEOUS) ×2 IMPLANT
DERMABOND ADVANCED .7 DNX12 (GAUZE/BANDAGES/DRESSINGS) ×2 IMPLANT
DRAPE LAPAROTOMY T 98X78 PEDS (DRAPES) ×2 IMPLANT
DRAPE UTILITY XL STRL (DRAPES) ×2 IMPLANT
ELECT PENCIL ROCKER SW 15FT (MISCELLANEOUS) ×2 IMPLANT
ELECT REM PT RETURN 15FT ADLT (MISCELLANEOUS) ×2 IMPLANT
GAUZE 4X4 16PLY ~~LOC~~+RFID DBL (SPONGE) ×2 IMPLANT
GLOVE SURG ORTHO 8.0 STRL STRW (GLOVE) ×2 IMPLANT
GOWN STRL REUS W/ TWL XL LVL3 (GOWN DISPOSABLE) ×4 IMPLANT
HEMOSTAT SURGICEL 2X4 FIBR (HEMOSTASIS) ×2 IMPLANT
ILLUMINATOR WAVEGUIDE N/F (MISCELLANEOUS) ×2 IMPLANT
KIT BASIN OR (CUSTOM PROCEDURE TRAY) ×2 IMPLANT
KIT TURNOVER KIT A (KITS) ×2 IMPLANT
PACK BASIC VI WITH GOWN DISP (CUSTOM PROCEDURE TRAY) ×2 IMPLANT
PAD MAGNETIC INSTR ST 16X20 (MISCELLANEOUS) ×2 IMPLANT
SHEARS HARMONIC 9CM CVD (BLADE) ×2 IMPLANT
SUT MNCRL AB 4-0 PS2 18 (SUTURE) ×2 IMPLANT
SUT SILK 3 0 SH 30 (SUTURE) ×2 IMPLANT
SUT VIC AB 3-0 SH 18 (SUTURE) ×4 IMPLANT
SYR BULB IRRIG 60ML STRL (SYRINGE) ×2 IMPLANT
TOWEL OR DSP ST BLU DLX 10/PK (DISPOSABLE) ×2 IMPLANT
TUBING CONNECTING 10 (TUBING) ×2 IMPLANT

## 2024-10-13 NOTE — Op Note (Signed)
 Procedure Note  Pre-operative Diagnosis:  multiple thyroid  nodules  Post-operative Diagnosis:  same  Surgeon:  Krystal Spinner, MD  Assistant:  none   Procedure:  Total thyroidectomy  Anesthesia:  General  Estimated Blood Loss:  25 cc  Drains: none         Specimen: thyroid  to pathology  Indications:  Patient is referred by Dr. 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 Patient has a history of thyroid  nodules dating back at least 4 years. She has undergone sequential ultrasound scanning as well as to fine-needle aspiration biopsies. Recently there has been enlargement in the dominant nodules in the right lobe and right side of the isthmus, both measuring 4 cm in size. One previous fine-needle aspiration biopsy had insufficient material for diagnosis. The second biopsy was a Bethesda category II, representing a benign nodule. However, on her most recent ultrasound examination from October 2025, there had been significant interval enlargement of the dominant nodules. Patient does have a history of oophorectomy for stroma with a 1.8 cm focus of papillary thyroid  carcinoma. Patient has never been on thyroid  medication. Her most recent TSH level was normal at 0.79. She has had no prior head or neck surgery. There is no family history of thyroid  disease and specifically no history of thyroid  cancer. Patient denies any compressive symptoms.   Procedure Details: Procedure was done in OR #7 at the [facility]. The patient was brought to the operating room and placed in a supine position on the operating room table. Following administration of general anesthesia, the patient was positioned and then prepped and draped in the usual aseptic fashion. After ascertaining that an adequate level of anesthesia had been achieved, a small Kocher incision was made with #15  blade. Dissection was carried through subcutaneous tissues and platysma.Hemostasis was achieved with the electrocautery. Skin flaps were elevated cephalad and caudad from the thyroid  notch to the sternal notch. A Mahorner self-retaining retractor was placed for exposure. Strap muscles were incised in the midline and dissection was begun on the left side.  Strap muscles were reflected laterally.  Left thyroid  lobe was mildly enlarged and lobular.  The left lobe was gently mobilized with blunt dissection. Superior pole vessels were dissected out and divided individually between small and medium ligaclips with the harmonic scalpel. The thyroid  lobe was rolled anteriorly. Branches of the inferior thyroid  artery were divided between small ligaclips with the harmonic scalpel. Inferior venous tributaries were divided between ligaclips. Both the superior and inferior parathyroid glands were identified and preserved on their vascular pedicles. The recurrent laryngeal nerve was identified and preserved along its course. The ligament of Court was released with the electrocautery and the gland was mobilized onto the anterior trachea. Isthmus was mobilized across the midline. There was a small pyramidal lobe present which was resected with the isthmus. Dry pack was placed in the left neck.  The right thyroid  lobe was gently mobilized with blunt dissection. Right thyroid  lobe was moderately enlarged with approximately four nodules present. Superior pole vessels were dissected out and divided between small and medium ligaclips with the Harmonic scalpel. Superior parathyroid was identified and preserved. Inferior venous tributaries were divided between medium ligaclips with the harmonic scalpel. The right thyroid  lobe was rolled anteriorly and the branches of the inferior thyroid  artery divided between small ligaclips. The right recurrent laryngeal nerve was identified and preserved along  its course. The ligament of Court was  released with the electrocautery. The right thyroid  lobe was mobilized onto the anterior trachea and the remainder of the thyroid  was dissected off the anterior trachea and the thyroid  was completely excised. A suture was used to mark the right lobe. The entire thyroid  gland was submitted to pathology for review.  Palpation of the operative field demonstrated no evidence of residual disease and no abnormal lymph nodes.  The neck was irrigated with warm saline. Fibrillar was placed throughout the operative field. Strap muscles were approximated in the midline with interrupted 3-0 Vicryl sutures. Platysma was closed with interrupted 3-0 Vicryl sutures. Skin was closed with a running 4-0 Monocryl subcuticular suture. Wound was washed and Dermabond was applied. The patient was awakened from anesthesia and brought to the recovery room. The patient tolerated the procedure well.   Krystal Spinner, MD [facility] Surgery Office: [phone]

## 2024-10-13 NOTE — Transfer of Care (Signed)
 Immediate Anesthesia Transfer of Care Note  Patient: Candice Ayala  Procedure(s) Performed: THYROIDECTOMY  Patient Location: PACU  Anesthesia Type:General  Level of Consciousness: drowsy and confused  Airway & Oxygen Therapy: Patient Spontanous Breathing and Patient connected to face mask oxygen  Post-op Assessment: Report given to RN and Post -op Vital signs reviewed and stable  Post vital signs: Reviewed and stable  Last Vitals:  Vitals Value Taken Time  BP 156/72 10/13/24 12:20  Temp    Pulse 81 10/13/24 12:22  Resp 20 10/13/24 12:22  SpO2 99 % 10/13/24 12:22  Vitals shown include unfiled device data.  Last Pain:  Vitals:   10/13/24 0755  TempSrc: Oral  PainSc: 0-No pain         Complications: No notable events documented.

## 2024-10-13 NOTE — Discharge Instructions (Signed)

## 2024-10-13 NOTE — Anesthesia Postprocedure Evaluation (Signed)
 Anesthesia Post Note  Patient: Candice Ayala  Procedure(s) Performed: THYROIDECTOMY     Patient location during evaluation: PACU Anesthesia Type: General Level of consciousness: awake and alert Pain management: pain level controlled Vital Signs Assessment: post-procedure vital signs reviewed and stable Respiratory status: spontaneous breathing, nonlabored ventilation, respiratory function stable and patient connected to nasal cannula oxygen Cardiovascular status: blood pressure returned to baseline and stable Postop Assessment: no apparent nausea or vomiting Anesthetic complications: no   No notable events documented.  Last Vitals:  Vitals:   10/13/24 1559 10/13/24 1751  BP: (!) 169/86 (!) 163/89  Pulse: 83 81  Resp:    Temp: (!) 36.3 C 36.6 C  SpO2: 93% 95%    Last Pain:  Vitals:   10/13/24 1751  TempSrc: Oral  PainSc:                  Cordella SQUIBB Ianmichael Amescua

## 2024-10-13 NOTE — Interval H&P Note (Signed)
 History and Physical Interval Note:  10/13/2024 9:30 AM  Candice Ayala  has presented today for surgery, with the diagnosis of MULTIPLE THYROID  NODULES.  The various methods of treatment have been discussed with the patient and family. After consideration of risks, benefits and other options for treatment, the patient has consented to    Procedure(s) with comments: THYROIDECTOMY (N/A) - TOTAL THYROIDECTOMY as a surgical intervention.    The patient's history has been reviewed, patient examined, no change in status, stable for surgery.  I have reviewed the patient's chart and labs.  Questions were answered to the patient's satisfaction.    Krystal Spinner, MD [facility] practice Office: [phone]   Krystal Spinner

## 2024-10-13 NOTE — Anesthesia Preprocedure Evaluation (Addendum)
 Anesthesia Evaluation  Patient identified by MRN, date of birth, ID band Patient awake    Reviewed: Allergy & Precautions, NPO status , Patient's Chart, lab work & pertinent test results  History of Anesthesia Complications (+) PONV and history of anesthetic complications  Airway Mallampati: II  TM Distance: >3 FB Neck ROM: Full    Dental no notable dental hx.    Pulmonary neg pulmonary ROS   Pulmonary exam normal        Cardiovascular hypertension,  Rhythm:Regular Rate:Normal     Neuro/Psych negative neurological ROS  negative psych ROS   GI/Hepatic negative GI ROS, Neg liver ROS,,,  Endo/Other  negative endocrine ROS    Renal/GU negative Renal ROS  negative genitourinary   Musculoskeletal  (+) Arthritis , Osteoarthritis,    Abdominal Normal abdominal exam  (+)   Peds  Hematology Lab Results      Component                Value               Date                      WBC                      5.1                 10/11/2024                HGB                      12.0                10/11/2024                HCT                      37.8                10/11/2024                MCV                      89.4                10/11/2024                PLT                      341                 10/11/2024             Lab Results      Component                Value               Date                      NA                       139                 10/11/2024                K  4.3                 10/11/2024                CO2                      27                  10/11/2024                GLUCOSE                  100 (H)             10/11/2024                BUN                      14                  10/11/2024                CREATININE               0.80                10/11/2024                CALCIUM                   9.8                 10/11/2024                EGFR                      78                  09/20/2024                GFRNONAA                 >60                 10/11/2024              Anesthesia Other Findings   Reproductive/Obstetrics                              Anesthesia Physical Anesthesia Plan  ASA: 2  Anesthesia Plan: General   Post-op Pain Management:    Induction: Intravenous  PONV Risk Score and Plan: 4 or greater and Ondansetron , Dexamethasone  and Treatment may vary due to age or medical condition  Airway Management Planned: Mask and Oral ETT  Additional Equipment: None  Intra-op Plan:   Post-operative Plan: Extubation in OR  Informed Consent: I have reviewed the patients History and Physical, chart, labs and discussed the procedure including the risks, benefits and alternatives for the proposed anesthesia with the patient or authorized representative who has indicated his/her understanding and acceptance.     Dental advisory given  Plan Discussed with: CRNA  Anesthesia Plan Comments:         Anesthesia Quick Evaluation

## 2024-10-13 NOTE — Anesthesia Procedure Notes (Signed)
 Procedure Name: Intubation Date/Time: 10/13/2024 10:10 AM  Performed by: Lanning Cena RAMAN, CRNAPre-anesthesia Checklist: Patient identified, Emergency Drugs available, Suction available and Patient being monitored Patient Re-evaluated:Patient Re-evaluated prior to induction Oxygen Delivery Method: Circle system utilized Preoxygenation: Pre-oxygenation with 100% oxygen Induction Type: IV induction Ventilation: Mask ventilation without difficulty Laryngoscope Size: Miller and 2 Grade View: Grade II Tube type: Oral Tube size: 7.0 mm Number of attempts: 1 Airway Equipment and Method: Stylet Placement Confirmation: ETT inserted through vocal cords under direct vision, positive ETCO2, CO2 detector and breath sounds checked- equal and bilateral Secured at: 20 cm Tube secured with: Tape Dental Injury: Teeth and Oropharynx as per pre-operative assessment

## 2024-10-13 NOTE — Progress Notes (Signed)
 Pt ambulated to restroom with one person assist. Pt had mild nausea while in the restroom. Pt voided. Pt ambulated back to stretcher with one person assist, safely.

## 2024-10-14 ENCOUNTER — Encounter (HOSPITAL_COMMUNITY): Payer: Self-pay | Admitting: Surgery

## 2024-10-14 DIAGNOSIS — E042 Nontoxic multinodular goiter: Secondary | ICD-10-CM | POA: Diagnosis not present

## 2024-10-14 LAB — CALCIUM: Calcium: 10.4 mg/dL — ABNORMAL HIGH (ref 8.9–10.3)

## 2024-10-14 NOTE — Progress Notes (Signed)
   10/14/24 0826  TOC Brief Assessment  Insurance and Status Reviewed  Patient has primary care physician Yes  Home environment has been reviewed home alone  Prior level of function: independent  Prior/Current Home Services No current home services  Social Drivers of Health Review SDOH reviewed no interventions necessary  Readmission risk has been reviewed Yes  Transition of care needs no transition of care needs at this time

## 2024-10-14 NOTE — Discharge Summary (Signed)
 Physician Discharge Summary  Patient ID: Candice Ayala MRN: [id number] DOB/AGE: [birth year] 68 y.o.  Admit date: 10/13/2024 Discharge date: 10/14/2024  Admission Diagnoses: [facility] Hx breast cancer  Discharge Diagnoses:  Principal Problem:   Multinodular goiter Active Problems:   Multiple thyroid  nodules   Discharged Condition: good  Hospital Course: 68 yof s/p total thyroid  doing well. Some nausea postop but resolved. Calcium  is fine  Consults: None  Significant Diagnostic Studies: none  Treatments: surgery: total thyroidectomy  Discharge Exam: Blood pressure (!) 162/90, pulse 81, temperature 98.4 F (36.9 C), temperature source Oral, resp. rate 17, height 5' 5 (1.651 m), weight 98.9 kg, SpO2 98%. Neck soft, no hematoma, incision clean  Disposition: Discharge disposition: 01-Home or Self Care        Allergies as of 10/14/2024       Reactions   Contrast Media [iodinated Contrast Media] Hives, Swelling        Medication List     TAKE these medications    amLODipine  5 MG tablet Commonly known as: NORVASC  TAKE 1 TABLET (5 MG TOTAL) BY MOUTH DAILY.   calcium  carbonate 500 MG chewable tablet Commonly known as: Tums Chew 2 tablets (400 mg of elemental calcium  total) by mouth 3 (three) times daily.   diphenhydrAMINE  25 MG tablet Commonly known as: BENADRYL  Take 25 mg by mouth every 8 (eight) hours as needed for allergies.   fluticasone  50 MCG/ACT nasal spray Commonly known as: FLONASE  Place 2 sprays into both nostrils daily.   ibuprofen  200 MG tablet Commonly known as: ADVIL  Take 200 mg by mouth every 6 (six) hours as needed for headache or moderate pain.   levothyroxine  100 MCG tablet Commonly known as: Synthroid  Take 1 tablet (100 mcg total) by mouth daily before breakfast.   loratadine 10 MG tablet Commonly known as: CLARITIN Take 10 mg by mouth daily as needed for allergies.   multivitamin with minerals tablet Take 1 tablet by mouth  daily.   traMADol  50 MG tablet Commonly known as: ULTRAM  Take 1-2 tablets (50-100 mg total) by mouth every 6 (six) hours as needed for moderate pain (pain score 4-6).   valsartan -hydrochlorothiazide  320-25 MG tablet Commonly known as: DIOVAN -HCT TAKE 1 TABLET BY MOUTH EVERY DAY        Follow-up Information     Eletha Boas, MD. Schedule an appointment as soon as possible for a visit in 3 week(s).   Specialty: General Surgery Why: For wound re-check Contact information: [address] [place] [address] [place] [address] [phone]                 Signed: Donnice Bury 10/14/2024, 9:12 AM

## 2024-10-16 ENCOUNTER — Ambulatory Visit: Payer: Self-pay | Admitting: Surgery

## 2024-10-16 LAB — SURGICAL PATHOLOGY

## 2024-10-16 NOTE — Progress Notes (Signed)
 Final pathology benign, as expected.  Krystal Spinner, MD [facility] practice Office: [phone]

## 2024-11-29 ENCOUNTER — Encounter: Payer: Self-pay | Admitting: Internal Medicine

## 2024-11-29 ENCOUNTER — Other Ambulatory Visit

## 2024-11-29 ENCOUNTER — Ambulatory Visit (INDEPENDENT_AMBULATORY_CARE_PROVIDER_SITE_OTHER): Admitting: Internal Medicine

## 2024-11-29 VITALS — BP 162/90 | HR 88 | Ht 65.0 in | Wt 209.0 lb

## 2024-11-29 DIAGNOSIS — C562 Malignant neoplasm of left ovary: Secondary | ICD-10-CM | POA: Diagnosis not present

## 2024-11-29 DIAGNOSIS — E890 Postprocedural hypothyroidism: Secondary | ICD-10-CM

## 2024-11-29 NOTE — Progress Notes (Signed)
 "   Name: Candice Ayala  MRN/ DOB: [id number], 1956-09-10    Age/ Sex: 69 y.o., female    PCP: Jarold Medici, MD   Reason for Endocrinology Evaluation:      Date of Initial Endocrinology Evaluation: 09/03/2023    HPI: Candice Ayala is a 69 y.o. 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 The patient presented for initial endocrinology clinic visit on 09/03/2023 for consultative assistance with her history of PTC.   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  Her tumor was determined to be low risk based on classic papillary architecture, no extraovarian extension and tumor <4 cm.  BRAF testing was negative    Thyroid  ultrasound 10/2020 revealed a right mid 3.5 cm nodule meeting FNA criteria as well as subcentimeter nodules on the left. She is s/p FNA of the right mid nodule 11/06/2020 with scant cellularity, but it was read as benign.   Repeat FNA 09/2023 continued to show insufficient cellularity   She was diagnosed with right breast DCIS in 2023, treated with lumpectomy and adjuvant radiation therapy.     NO FH of thyroid  disease Mother with hx colon cancer    S/P total thyroidectomy 10/13/2024, pathology report consistent with multinodular hyperplasia   SUBJECTIVE:    Today (11/29/2024):  Candice Ayala is here for follow-up on multinodular goiter   Since her last visit here, the patient underwent total thyroidectomy in November, 2025 The patient has been noted weight loss Incision is healing well, but has sensitivity at the site  Has noted occasional hoarseness  No palpitations  Constipation post-op resolved  No tremors  Energy is stable  No perioral tingling No muscle cramps or spasms Levothyroxine  100 mcg daily  HISTORY:   Past Medical History:  Past Medical History:  Diagnosis Date   Allergy    Arthritis    Breast cancer (HCC)    Hypertension    Malignant struma ovarii of left ovary (HCC) 10/22/2020   PONV (postoperative nausea and vomiting)    Pre-diabetes    Past Surgical History:  Past Surgical History:  Procedure Laterality Date   BREAST BIOPSY Right 11/27/2013   Procedure: RIGHT BREAST BIOPSY WITH NEEDLE LOCALIZATION;  Surgeon: Krystal JINNY Russell, MD;  Location: [facility];  Service: General;  Laterality: Right;   BREAST LUMPECTOMY WITH RADIOACTIVE SEED LOCALIZATION Right 12/03/2022   Procedure: RIGHT BREAST LUMPECTOMY WITH RADIOACTIVE SEED LOCALIZATION X2;  Surgeon: Vernetta Berg, MD;  Location: [facility];  Service: General;  Laterality: Right;   COLONOSCOPY     JOINT REPLACEMENT  2004   right total hip   LYSIS OF ADHESION N/A 10/01/2020   Procedure: LYSIS OF ADHESION;  Surgeon: Eloy Herring, MD;  Location: [facility];  Service: Gynecology;  Laterality: N/A;   OVARIAN CYST REMOVAL  1980   ROBOTIC ASSISTED TOTAL HYSTERECTOMY WITH BILATERAL SALPINGO OOPHERECTOMY N/A 10/01/2020   Procedure: XI ROBOTIC ASSISTED TOTAL HYSTERECTOMY WITH UNILATERL  SALPINGO OOPHORECTOMY;  Surgeon: Eloy Herring, MD;  Location: [facility];  Service: Gynecology;  Laterality: N/A;   THYROIDECTOMY N/A 10/13/2024   Procedure: THYROIDECTOMY;  Surgeon: Eletha Krystal, MD;  Location: [facility];  Service: General;  Laterality: N/A;  TOTAL THYROIDECTOMY   TOTAL HIP ARTHROPLASTY Right 2004   TOTAL HIP ARTHROPLASTY  Left 02/12/2022    Social History:  reports that she has never smoked. She has never used smokeless tobacco. She reports that she does not drink alcohol and does not use drugs. Family History: family history includes Breast cancer in her maternal grandmother; Colon cancer in her mother; Congestive Heart Failure in her brother; Early death in her father; Hypertension in her brother.   HOME MEDICATIONS: Allergies as of  11/29/2024       Reactions   Contrast Media [iodinated Contrast Media] Hives, Swelling        Medication List        Accurate as of November 29, 2024  2:49 PM. If you have any questions, ask your nurse or doctor.          amLODipine  5 MG tablet Commonly known as: NORVASC  TAKE 1 TABLET (5 MG TOTAL) BY MOUTH DAILY.   calcium  carbonate 500 MG chewable tablet Commonly known as: Tums Chew 2 tablets (400 mg of elemental calcium  total) by mouth 3 (three) times daily.   diphenhydrAMINE  25 MG tablet Commonly known as: BENADRYL  Take 25 mg by mouth every 8 (eight) hours as needed for allergies.   fluticasone  50 MCG/ACT nasal spray Commonly known as: FLONASE  Place 2 sprays into both nostrils daily.   ibuprofen  200 MG tablet Commonly known as: ADVIL  Take 200 mg by mouth every 6 (six) hours as needed for headache or moderate pain.   levothyroxine  100 MCG tablet Commonly known as: Synthroid  Take 1 tablet (100 mcg total) by mouth daily before breakfast.   loratadine 10 MG tablet Commonly known as: CLARITIN Take 10 mg by mouth daily as needed for allergies.   multivitamin with minerals tablet Take 1 tablet by mouth daily.   traMADol  50 MG tablet Commonly known as: ULTRAM  Take 1-2 tablets (50-100 mg total) by mouth every 6 (six) hours as needed for moderate pain (pain score 4-6).   valsartan -hydrochlorothiazide  320-25 MG tablet Commonly known as: DIOVAN -HCT TAKE 1 TABLET BY MOUTH EVERY DAY          REVIEW OF SYSTEMS: A comprehensive ROS was conducted with the patient and is negative except as per HPI    OBJECTIVE:  VS: BP (!) 162/90 Comment: no medication  Pulse 88   Ht 5' 5 (1.651 m)   Wt 209 lb (94.8 kg)   SpO2 98%   BMI 34.78 kg/m    Wt Readings from Last 3 Encounters:  11/29/24 209 lb (94.8 kg)  10/13/24 218 lb 0.6 oz (98.9 kg)  10/11/24 218 lb 0.6 oz (98.9 kg)     EXAM: General: Pt appears well and is in NAD  Neck: General: Supple without  adenopathy. Thyroid : surgical incision is clean   Lungs: Clear with good BS bilat   Heart: Auscultation: RRR.  Extremities:  BL LE: No pretibial edema   Mental Status: Judgment, insight: Intact Orientation: Oriented to time, place, and person Mood and affect: No depression, anxiety, or agitation    DATA REVIEWED:  Latest Reference Range & Units 11/29/24 15:17  TSH 0.40 - 4.50 mIU/L 0.25 (L)  T4,Free(Direct) 0.8 - 1.8 ng/dL 1.5  (L): Data is abnormally low   10/31/2024 Calcium  9.7     Thyroid  ultrasound 08/29/2024  EXAM: THYROID  ULTRASOUND   TECHNIQUE: Ultrasound examination of the thyroid  gland and adjacent soft tissues was performed.   COMPARISON:  US  Thyroid , 09/08/2023.   FINDINGS: Parenchymal Echotexture: Moderately heterogenous   Isthmus: 0.9 cm   Right lobe: 5.5 x 2.4 x  2.9 cm   Left lobe: 4.7 x 1.8 x 2.0 cm   _________________________________________________________   Estimated total number of nodules >/= 1 cm: 2   Number of spongiform nodules >/=  2 cm not described below (TR1): 0   Number of mixed cystic and solid nodules >/= 1.5 cm not described below (TR2): 0   _________________________________________________________   Nodule # 1:   Location: RIGHT; Mid.  Described as isthmus nodule   Maximum size: 4.0 cm; Other 2 dimensions: 3.2 x 2.4 cm, previously 3.7 x 3.5 x 1.7 cm   No aggressive features on today's evaluation. This nodule appears morphologically stable for least 4 years, and was previously biopsied most recently in 10/18/2023.   Assuming a benign pathologic diagnosis, repeat sampling and/or dedicated follow-up is not recommended.   _________________________________________________________   Nodule # 2:   Location: RIGHT; Mid   Maximum size: 4.0 cm; Other 2 dimensions: 2.9 x 2.1 cm, pseudo nodular in appearance   Composition: solid/almost completely solid (2)   Echogenicity: isoechoic (1)   Shape: not taller-than-wide  (0)   Margins: ill-defined (0)   Echogenic foci: none (0)   ACR TI-RADS total points: 3.   ACR TI-RADS risk category: TR3 (3 points).   ACR TI-RADS recommendations:   a follow-up ultrasound in 1 year should be considered.   _________________________________________________________   Additional sub-1 cm solid and cystic nodules scattered within the gland do not meet threshold for follow-up nor biopsy per current criteria.   No cervical adenopathy or abnormal fluid collection within the imaged neck.   IMPRESSION: 1. Enlarged, heterogeneous and multinodular thyroid  consistent with a goiter. 2. New, 4.0 cm RIGHT mid TR-3 thyroid  nodule is favored to most likely represent a pseudo-nodule. A follow-up ultrasound in 1 year should be considered. 3. 4.0 cm RIGHT mid thyroid  nodule, described as isthmic on today's evaluation. This nodule was previously biopsied most recently in 10/18/2023. Assuming a benign pathologic diagnosis, repeat sampling and/or dedicated follow-up is not recommended.   The above is in keeping with the ACR TI-RADS recommendations - J Am Coll Radiol 2017;14:587-595.       FNA right mid nodule 10/18/2023   Clinical History: Nodule labeled 1 in the mid right thyroid  lobe is a  previously biopsied solid nodule measuring up to 3.5cm, previuosly 3.5cm  when measured in a similar fashion, The nodule overall remains similar  in size and morphology  Specimen Submitted:  A. THYROID , RIGHT MID, FINE NEEDLE ASPIRATION    FINAL MICROSCOPIC DIAGNOSIS:  - Scant follicular epithelium present (Bethesda category I)    FNA right mid nodule 11/06/2020  Clinical History: 3.5 cm x 1.8 cm x 2.4 cm RMP  Specimen Submitted:  A. THYROID , RMP, FINE NEEDLE ASPIRATION:    FINAL MICROSCOPIC DIAGNOSIS:  - Consistent with benign follicular nodule (Bethesda category II)   SPECIMEN ADEQUACY:  Satisfactory but limited for evaluation, scant cellularity    Pathology report  10/01/2020  FINAL MICROSCOPIC DIAGNOSIS:  A. FALLOPIAN TUBE AND OVARY, LEFT, SALPINGO-OOPHORECTOMY: Ovary: - Stroma ovarii with papillary thyroid  carcinoma.  See oncology table.  Fallopian tube: - No significant histopathologic findings.  B. UTERUS AND CERVIX, HYSTERECTOMY: Cervix: - Mild acute and chronic cervicitis.  Uterus: - Endometrium: Endometrial polyp. - Myometrium: Leiomyomata. - Serosa: No significant histopathologic findings.  ONCOLOGY TABLE:  OVARY or FALLOPIAN TUBE or PRIMARY PERITONEUM:  Procedure: Hysterectomy and left salpingo-oophorectomy Specimen Integrity: Ovary: Capsule disrupted Tumor Site: Left ovary Tumor Size: Greatest dimension: 9 cm (See comment) Ovarian  Surface Involvement: Not identified Histologic Type: Struma ovarii with papillary thyroid  carcinoma Other Tissue/Organ Involvement: Not identified Peritoneal/Ascitic Fluid: Benign Regional Lymph Nodes: Not applicable (no regional lymph nodes submitted or found) Pathologic Stage Classification (pTNM, AJCC 8th Edition): pT1a, pN not assigned Representative Tumor Block: A6 Comment: TTF-1 and Thyroglobulin are positive in papillary thyroid  carcinoma.  Calretinin, Inhibin and WT-1 are negative.  Overall, the ovarian mass measures 9 cm in greatest dimension, and the papillary thyroid  carcinoma component measures 1.8 cm in greatest dimension. Results reported to [facility] on 10/04/2000.  Dr. Alvaro reviewed select slides. (v1.1.1.0)   Old records , labs and images have been reviewed.     ASSESSMENT/PLAN/RECOMMENDATIONS:   Postoperative hypothyroidism:  - Patient is healing well from total thyroidectomy -Postoperative serum calcium  normal -Clinically she is euthyroid - TSH is low, will decrease levothyroxine  as below - Will repeat TFTs in 2 months  Medication Stop levothyroxine  100 mcg Start levothyroxine  88 mcg daily  2. Malignant Thyroid  Type- Papillary Neoplasm in Struma Ovarii  :  -This has been thought to be low risk with a PTC size of 1.8 cm, no metastases -S/p resection of the left ovary and fallopian tube as well as hysterectomy -Due to the rarity  of malignant struma ovarii and it is available literature data, no formal standard criteria have been established regarding his diagnosis, management and follow-up. -Tumor size of 2 cm or less is associated with a low chance of recurrence. -Thyroglobulin elevated, with undetectable thyroglobulin antibodies , we will use this as a baseline for future monitoring  Follow-up in 4 months   Signed electronically by: Stefano Redgie Butts, MD  [facility]  [facility] [address] [place] [place], [place] [address] Phone: [phone] FAX: [phone]   CC: Jarold Medici, MD [address] [place] [address] Phone: [phone] Fax: [phone]   Return to Endocrinology clinic as below: Future Appointments  Date Time Provider Department Center  02/15/2025  9:00 AM Jarold Medici, MD [facility]  05/16/2025  9:40 AM [facility]-ANNUAL WELLNESS VISIT [facility] [address]  08/15/2025 10:10 AM Renessa Wellnitz, Donell Redgie, MD [facility] None         "

## 2024-11-29 NOTE — Patient Instructions (Signed)

## 2024-11-30 ENCOUNTER — Telehealth: Payer: Self-pay | Admitting: Internal Medicine

## 2024-11-30 LAB — T4, FREE: Free T4: 1.5 ng/dL (ref 0.8–1.8)

## 2024-11-30 LAB — TSH: TSH: 0.25 m[IU]/L — ABNORMAL LOW (ref 0.40–4.50)

## 2024-11-30 MED ORDER — LEVOTHYROXINE SODIUM 88 MCG PO TABS: 88.0000 ug | ORAL_TABLET | Freq: Every day | ORAL | 3 refills | Status: AC

## 2024-11-30 NOTE — Telephone Encounter (Signed)
"  Informed patient of results and recommendations. Appt scheduled.   "

## 2024-11-30 NOTE — Telephone Encounter (Signed)
 Can you please let the patient know that the current dose of levothyroxine  100 mcg is too much for her    I have sent a new prescription of levothyroxine  88 mcg daily   Please schedule for repeat labs in 2 months   Thanks

## 2025-02-01 ENCOUNTER — Other Ambulatory Visit

## 2025-02-15 ENCOUNTER — Ambulatory Visit: Payer: Self-pay | Admitting: Internal Medicine

## 2025-03-28 ENCOUNTER — Ambulatory Visit: Admitting: Internal Medicine

## 2025-05-16 ENCOUNTER — Ambulatory Visit

## 2025-05-16 ENCOUNTER — Ambulatory Visit: Payer: Self-pay

## 2025-08-15 ENCOUNTER — Ambulatory Visit: Admitting: Internal Medicine
